# Patient Record
Sex: Female | Born: 1980
Health system: Southern US, Community
[De-identification: ages and names within clinical notes are randomized; demographics above are authoritative.]

## PROBLEM LIST (undated history)

## (undated) DIAGNOSIS — M1990 Unspecified osteoarthritis, unspecified site: Secondary | ICD-10-CM

## (undated) DIAGNOSIS — G43909 Migraine, unspecified, not intractable, without status migrainosus: Secondary | ICD-10-CM

## (undated) DIAGNOSIS — R51 Headache: Secondary | ICD-10-CM

## (undated) DIAGNOSIS — D219 Benign neoplasm of connective and other soft tissue, unspecified: Secondary | ICD-10-CM

## (undated) DIAGNOSIS — J45909 Unspecified asthma, uncomplicated: Secondary | ICD-10-CM

## (undated) DIAGNOSIS — R519 Headache, unspecified: Secondary | ICD-10-CM

## (undated) DIAGNOSIS — I10 Essential (primary) hypertension: Secondary | ICD-10-CM

## (undated) HISTORY — DX: Unspecified asthma, uncomplicated: J45.909

## (undated) HISTORY — DX: Benign neoplasm of connective and other soft tissue, unspecified: D21.9

## (undated) HISTORY — DX: Migraine, unspecified, not intractable, without status migrainosus: G43.909

## (undated) HISTORY — DX: Unspecified osteoarthritis, unspecified site: M19.90

## (undated) HISTORY — DX: Headache: R51

## (undated) HISTORY — DX: Headache, unspecified: R51.9

## (undated) HISTORY — DX: Essential (primary) hypertension: I10

---

## 2005-10-29 ENCOUNTER — Other Ambulatory Visit: Admission: RE | Admit: 2005-10-29 | Discharge: 2005-10-29 | Payer: Self-pay | Admitting: Obstetrics and Gynecology

## 2005-10-31 ENCOUNTER — Emergency Department (HOSPITAL_COMMUNITY): Admission: EM | Admit: 2005-10-31 | Discharge: 2005-10-31 | Payer: Self-pay | Admitting: Family Medicine

## 2006-09-22 HISTORY — PX: WISDOM TOOTH EXTRACTION: SHX21

## 2011-10-09 DIAGNOSIS — Z131 Encounter for screening for diabetes mellitus: Secondary | ICD-10-CM | POA: Insufficient documentation

## 2011-10-09 DIAGNOSIS — G43909 Migraine, unspecified, not intractable, without status migrainosus: Secondary | ICD-10-CM | POA: Insufficient documentation

## 2011-10-09 DIAGNOSIS — G44009 Cluster headache syndrome, unspecified, not intractable: Secondary | ICD-10-CM | POA: Insufficient documentation

## 2011-10-09 DIAGNOSIS — M25569 Pain in unspecified knee: Secondary | ICD-10-CM | POA: Insufficient documentation

## 2011-10-09 DIAGNOSIS — I10 Essential (primary) hypertension: Secondary | ICD-10-CM | POA: Insufficient documentation

## 2011-10-09 LAB — BASIC METABOLIC PANEL
BUN: 15 mg/dL (ref 4–21)
Creatinine: 0.9 mg/dL (ref 0.5–1.1)
Glucose: 92 mg/dL
Potassium: 4.2 mmol/L (ref 3.4–5.3)
Sodium: 135 mmol/L — AB (ref 137–147)

## 2011-10-09 LAB — LIPID PANEL
Cholesterol: 127 mg/dL (ref 0–200)
HDL: 48 mg/dL (ref 35–70)
LDL CALC: 70 mg/dL
Triglycerides: 45 mg/dL (ref 40–160)

## 2011-10-09 LAB — CBC AND DIFFERENTIAL
HEMATOCRIT: 39 % (ref 36–46)
HEMOGLOBIN: 13.2 g/dL (ref 12.0–16.0)
Platelets: 253 10*3/uL (ref 150–399)
WBC: 5.1 10^3/mL

## 2011-10-09 LAB — HEPATIC FUNCTION PANEL
ALK PHOS: 60 U/L (ref 25–125)
ALT: 12 U/L (ref 7–35)
AST: 18 U/L (ref 13–35)
Bilirubin, Total: 0.5 mg/dL

## 2011-10-09 LAB — TSH: TSH: 0.59 u[IU]/mL (ref 0.41–5.90)

## 2011-10-09 LAB — HEMOGLOBIN A1C: HEMOGLOBIN A1C: 5.3 % (ref 4.0–6.0)

## 2011-11-19 DIAGNOSIS — M2550 Pain in unspecified joint: Secondary | ICD-10-CM | POA: Insufficient documentation

## 2012-01-15 LAB — HM PAP SMEAR

## 2012-01-20 DIAGNOSIS — R8789 Other abnormal findings in specimens from female genital organs: Secondary | ICD-10-CM | POA: Insufficient documentation

## 2012-01-20 DIAGNOSIS — R87618 Other abnormal cytological findings on specimens from cervix uteri: Secondary | ICD-10-CM | POA: Insufficient documentation

## 2013-02-20 DIAGNOSIS — M659 Synovitis and tenosynovitis, unspecified: Secondary | ICD-10-CM | POA: Insufficient documentation

## 2014-07-26 ENCOUNTER — Telehealth: Payer: Self-pay | Admitting: Neurology

## 2014-07-26 NOTE — Telephone Encounter (Signed)
Pt was moved to 08-08-14 due to Dr Tomi Likens appt

## 2014-08-01 ENCOUNTER — Ambulatory Visit: Payer: Self-pay | Admitting: Neurology

## 2014-08-08 ENCOUNTER — Encounter: Payer: Self-pay | Admitting: Neurology

## 2014-08-08 ENCOUNTER — Ambulatory Visit (INDEPENDENT_AMBULATORY_CARE_PROVIDER_SITE_OTHER): Payer: 59 | Admitting: Neurology

## 2014-08-08 VITALS — BP 130/78 | HR 80 | Temp 98.4°F | Resp 20 | Ht 66.0 in | Wt 202.5 lb

## 2014-08-08 DIAGNOSIS — G43011 Migraine without aura, intractable, with status migrainosus: Secondary | ICD-10-CM

## 2014-08-08 MED ORDER — NORTRIPTYLINE HCL 10 MG PO CAPS: 10.0000 mg | ORAL_CAPSULE | Freq: Every day | ORAL | Status: DC

## 2014-08-08 MED ORDER — ELETRIPTAN HYDROBROMIDE 40 MG PO TABS: 40.0000 mg | ORAL_TABLET | ORAL | Status: DC | PRN

## 2014-08-08 NOTE — Patient Instructions (Signed)
1.  Stop lamictal.  Instead, start nortriptyline 10mg  at bedtime.  Call in 4 weeks with update and we can adjust dose if needed. 2.  Stop Treximet.  At earliest onset of headache, take 1 tablet of Relpax.  May repeat once in 2 hours if headache recurs or persists.  If ineffective, then next time you get a headache, you can try taking it with naproxen 500mg .  3.  Keep headache diary 4.  Follow up in 3 months but call in 4 weeks with update.

## 2014-08-08 NOTE — Progress Notes (Signed)
NEUROLOGY CONSULTATION NOTE  Ariel Perez MRN: [id number] DOB: 07/13/1981  Referring provider: Margurite Auerbach, MD Primary care provider: Margurite Auerbach, MD  Reason for consult:  headache  HISTORY OF PRESENT ILLNESS: Ariel Perez is a 33 year old right-handed woman who presents for headache.  Onset:  Since adolescence Location:  Bi-frontal Quality:  throbbing Intensity:  8/10 Aura:  no Prodrome:  no Associated symptoms:  Photophobia, phonophobia, sees halos around lights, osmophobia. Duration:  7-10 days Frequency:  Once a month.  Random.  Not associated with her period. Triggers/exacerbating factors:  Bananas, stress, perfumes Relieving factors:  Quiet, rest Activity:  Able to be active  Past abortive therapy:  Excedrin Migraine, ibuprofen, Tylenol, naproxen Past preventative therapy:  Topamax (side effects), gabapentin (side effects), atenolol (ineffective  Current abortive therapy:  Treximet (takes edge off for short while and headache recurs), indomethacin 25mg  Current preventative therapy:  Lamictal 25mg  Other medications:  Lamictal 25mg , Ortho Evra  Caffeine:  Dr. 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 Family history of headache:  no  PAST MEDICAL HISTORY: Past Medical History  Diagnosis Date  . Headache   . Hypertension   . Osteoarthritis     PAST SURGICAL HISTORY: No past surgical history on file.  MEDICATIONS: No current outpatient prescriptions on file prior to visit.   No current facility-administered medications on file prior to visit.    ALLERGIES: Not on File  FAMILY HISTORY: Family History  Problem Relation Age of Onset  . Cancer Father     unknown  . Diabetes Paternal Grandmother   . Hypertension Maternal Grandmother   . Rheum arthritis  Maternal Grandmother   . Cancer Maternal Grandfather     SOCIAL HISTORY: History   Social History  . Marital Status: Single    Spouse Name: N/A    Number of Children: N/A  . Years of Education: N/A   Occupational History  . Not on file.   Social History Main Topics  . Smoking status: Never Smoker   . Smokeless tobacco: Never Used  . Alcohol Use: 0.0 oz/week    0 Not specified per week     Comment: occ  . Drug Use: No  . Sexual Activity:    Partners: Male   Other Topics Concern  . Not on file   Social History Narrative  . No narrative on file    REVIEW OF SYSTEMS: Constitutional: No fevers, chills, or sweats, no generalized fatigue, change in appetite Eyes: No visual changes, double vision, eye pain Ear, nose and throat: No hearing loss, ear pain, nasal congestion, sore throat Cardiovascular: No chest pain, palpitations Respiratory:  No shortness of breath at rest or with exertion, wheezes GastrointestinaI: No nausea, vomiting, diarrhea, abdominal pain, fecal incontinence Genitourinary:  No dysuria, urinary retention or frequency Musculoskeletal:  No neck pain, back pain Integumentary: No rash, pruritus, skin lesions Neurological: as above Psychiatric: No depression, insomnia, anxiety Endocrine: No palpitations, fatigue, diaphoresis, mood swings, change in appetite, change in weight, increased thirst Hematologic/Lymphatic:  No anemia, purpura, petechiae. Allergic/Immunologic: no itchy/runny eyes, nasal congestion, recent allergic reactions, rashes  PHYSICAL EXAM: Filed Vitals:   08/08/14 0844  BP: 130/78  Pulse: 80  Temp: 98.4 F (36.9 C)  Resp: 20   General: No acute distress Head:  Normocephalic/atraumatic Eyes:  fundi unremarkable, without vessel  changes, exudates, hemorrhages or papilledema. CN III, IV, VI:  full range of motion, no nystagmus, no ptosis Neck: supple, no paraspinal tenderness, full range of motion Back: No paraspinal tenderness Heart:  regular rate and rhythm Lungs: Clear to auscultation bilaterally. Vascular: No carotid bruits. Neurological Exam: Mental status: alert and oriented to person, place, and time, recent and remote memory intact, fund of knowledge intact, attention and concentration intact, speech fluent and not dysarthric, language intact. Cranial nerves: CN I: not tested CN II: pupils equal, round and reactive to light, visual fields intact, fundi unremarkable, without vessel changes, exudates, hemorrhages or papilledema. CN III, IV, VI:  full range of motion, no nystagmus, no ptosis CN V: facial sensation intact CN VII: upper and lower face symmetric CN VIII: hearing intact CN IX, X: gag intact, uvula midline CN XI: sternocleidomastoid and trapezius muscles intact CN XII: tongue midline Bulk & Tone: normal, no fasciculations. Motor:  5/5 throughout Sensation:  Temperature and  Deep Tendon Reflexes:  2+ throughout, toes downgoing Finger to nose testing:  No dysmetria Gait:  Normal station and stride.  Able to turn and walk in tandem. Romberg negative.  IMPRESSION: Migraine without aura, intractable  PLAN: 1.  Stop lamictal.  Instead, start nortriptyline 10mg  at bedtime.  Call in 4 weeks with update and we can adjust dose if needed. 2.  Stop Treximet.  Instead take Relpax 40mg  as it is long-acting. 3.  Keep headache diary 4.  Will get notes from prior neurologists. 5.  Follow up in 3 months but call in 4 weeks with update.  Thank you for allowing me to take part in the care of this patient.  Metta Clines, DO  CC: Margurite Auerbach, MD

## 2014-08-15 ENCOUNTER — Telehealth: Payer: Self-pay | Admitting: Neurology

## 2014-08-15 NOTE — Telephone Encounter (Signed)
Pt reports Relpax has not broken her migraine pain. Please CB @ [phone]

## 2014-08-15 NOTE — Telephone Encounter (Signed)
Please advise to below note

## 2014-08-16 MED ORDER — SUMATRIPTAN SUCCINATE 6 MG/0.5ML ~~LOC~~ SOLN: 6.0000 mg | SUBCUTANEOUS | Status: DC | PRN

## 2014-08-16 NOTE — Telephone Encounter (Signed)
I would prescribe her Imitrex 6mg  [place] injections.  In the meantime, if the migraine is persisting, she can come to the office this morning for headache cocktail (Toradol 60mg Lenard Galloway 25mg /Reglan 10mg ).

## 2014-08-16 NOTE — Telephone Encounter (Signed)
Tried to call patient at work - but was on hold for 12+ minutes. LM on patient's voicemail letting her know that pharmacist could instruct her on how to give injections. She is to call back if she is not comfortable with this and we will try nasal spray. RX sent to pharmacy.

## 2014-08-16 NOTE — Telephone Encounter (Signed)
The pharmacist should be able to tell her how to use the injections.  It is not a medication that typically requires an RN to demonstrate.  Otherwise, she can try the Imitrex 5mg  nasal spray, 1 spray in each nostril x1.  May repeat once in 2 hours if needed.

## 2014-08-16 NOTE — Telephone Encounter (Signed)
Left message on machine for patient to call back.

## 2014-08-16 NOTE — Telephone Encounter (Signed)
Patient called back - states she is at work today and can not come to the office. She has never given herself injections before and would need to be trained. Home health will only train patient if she is homebound and she can not make it to our office. Please advise.

## 2014-08-21 ENCOUNTER — Telehealth: Payer: Self-pay | Admitting: *Deleted

## 2014-08-21 NOTE — Telephone Encounter (Signed)
Called to pharmacy had not been called in  Or faxed on 08/16/14 per Pam Sheets

## 2014-08-21 NOTE — Telephone Encounter (Signed)
Patient was calling to check the status of a Rx that was requested on Wednesday for Imitrex injections. Call back number [phone]

## 2014-09-12 ENCOUNTER — Other Ambulatory Visit: Payer: Self-pay | Admitting: Neurology

## 2014-10-12 ENCOUNTER — Other Ambulatory Visit: Payer: Self-pay | Admitting: Nurse Practitioner

## 2014-10-12 ENCOUNTER — Other Ambulatory Visit: Payer: 59

## 2014-10-12 DIAGNOSIS — J020 Streptococcal pharyngitis: Secondary | ICD-10-CM

## 2014-10-12 MED ORDER — AMOXICILLIN-POT CLAVULANATE 875-125 MG PO TABS: 1.0000 | ORAL_TABLET | Freq: Two times a day (BID) | ORAL | Status: DC

## 2014-10-12 NOTE — Addendum Note (Signed)
Addended by: Johnsie Cancel on: 10/12/2014 10:12 AM   Modules accepted: Orders

## 2014-10-17 ENCOUNTER — Other Ambulatory Visit: Payer: Self-pay | Admitting: Nurse Practitioner

## 2014-10-17 MED ORDER — FLUCONAZOLE 150 MG PO TABS: 150.0000 mg | ORAL_TABLET | Freq: Once | ORAL | Status: DC

## 2014-10-23 LAB — CULTURE, GROUP A STREP: STREP A CULTURE: NEGATIVE

## 2014-11-08 ENCOUNTER — Ambulatory Visit: Payer: 59 | Admitting: Neurology

## 2014-11-29 ENCOUNTER — Telehealth: Payer: Self-pay | Admitting: Neurology

## 2014-11-29 NOTE — Telephone Encounter (Signed)
Per Ariel Perez-Pt's 12/08/14 appt needed to be r/s to close dr. Georgie Chard schedule. I called pt to r/s her appt and she was at a store and advised me that she would call me later to r/s.

## 2014-11-30 ENCOUNTER — Ambulatory Visit: Payer: 59 | Admitting: Neurology

## 2014-12-08 ENCOUNTER — Ambulatory Visit: Payer: 59 | Admitting: Neurology

## 2015-01-08 ENCOUNTER — Telehealth: Payer: Self-pay | Admitting: Neurology

## 2015-01-08 NOTE — Telephone Encounter (Signed)
Pt with migraine over the w/e. Imitrex hasn't helped and her daily meds were not refilled. Pupils uneven, right is bigger than the left. CB# [phone] / Sherri S.

## 2015-01-08 NOTE — Telephone Encounter (Signed)
I spoke with patient  Ariel Perez 10 mg was called into [facility] pharmacy

## 2015-01-15 ENCOUNTER — Ambulatory Visit (INDEPENDENT_AMBULATORY_CARE_PROVIDER_SITE_OTHER): Payer: 59 | Admitting: Neurology

## 2015-01-15 ENCOUNTER — Encounter: Payer: Self-pay | Admitting: Neurology

## 2015-01-15 VITALS — BP 128/62 | HR 70 | Resp 16 | Ht 66.0 in | Wt 198.1 lb

## 2015-01-15 DIAGNOSIS — G43019 Migraine without aura, intractable, without status migrainosus: Secondary | ICD-10-CM | POA: Insufficient documentation

## 2015-01-15 MED ORDER — NORTRIPTYLINE HCL 25 MG PO CAPS: 25.0000 mg | ORAL_CAPSULE | Freq: Every day | ORAL | Status: DC

## 2015-01-15 MED ORDER — PREDNISONE 10 MG PO TABS: ORAL_TABLET | ORAL | Status: DC

## 2015-01-15 NOTE — Patient Instructions (Signed)
1.  Increase nortriptyline to 2tablets at bedtime.  Pick up a two week prescription of nortriptyline 25mg  tablets (1 tablet at bedtime).  Start this once you finish the 10mg  tablets.  When you finish the 25mg  tablets, call with update and we can increase dose further if needed. 2.  In order to try and break current headache, will prescribe you a prednisone taper.  Take 6tabs at once x1day, then 5tabs x1day, then 4tabs x1day, then 3tabs x1day, then 2tabs x1day, then 1tab x1day, then STOP. 3.  Continue Imitrex injection as needed. 4.  Follow up in 3 months but call in 4 weeks with update.

## 2015-01-15 NOTE — Progress Notes (Signed)
NEUROLOGY FOLLOW UP OFFICE NOTE  [name] [id number]  HISTORY OF PRESENT ILLNESS: Ariel Perez is a 34 year old right-handed woman who follows up for migraine without aura.   UPDATE: She reports increased stress at work, which worsened her migraines.  She has had constant headache for 2 weeks with fluctuations in intensity. Intensity:  4/10 unless intense, in which it is 8-9/10 Duration:  Constant with fluctuations lasting a day Frequency:  daily  Current abortive therapy:  Imitrex 6mg  [place] (usually works until two weeks ago Current preventative therapy:  Nortriptyline 10mg .  She took this until February and then ran out.  Did not get refilled until about a week ago. Other medications:  Ortho Evra   HISTORY: Onset:  Since adolescence Location:  Bi-frontal Quality:  throbbing Initial Intensity:  8/10 Aura:  no Prodrome:  no Associated symptoms:  Photophobia, phonophobia, sees halos around lights, osmophobia. Initial Duration:  7-10 days Initial Frequency:  Once a month.  Random.  Not associated with her period. 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 Family history of headache:  no  PAST MEDICAL HISTORY: Past Medical History  Diagnosis Date  . Headache   . Hypertension   . Osteoarthritis     MEDICATIONS: Current Outpatient Prescriptions on File Prior to Visit  Medication Sig Dispense Refill  . SUMAtriptan (IMITREX) 6 MG/0.5ML SOLN injection Inject 0.5 mLs (6 mg total) into the skin every 2 (two) hours as needed for migraine or headache (not to exceed 2 doses in 24 hours). May repeat in  2 hours if headache persists or recurs. 9 vial 1  . amoxicillin-clavulanate (AUGMENTIN) 875-125 MG per tablet Take 1 tablet by mouth 2 (two) times daily. (Patient not taking: Reported on 01/15/2015) 20 tablet 0  . diclofenac sodium (VOLTAREN) 1 % GEL daily as needed. Frequency:BID   Dosage:0.0     Instructions:  Note:Dose: 1 %    . eletriptan (RELPAX) 40 MG tablet Take 1 tablet (40 mg total) by mouth as needed for migraine or headache. Take 1tab at earliest onset of HA. May repeat x1 in 2 hours if headache persists or recurs. (Patient not taking: Reported on 01/15/2015) 9 tablet 0  . fluconazole (DIFLUCAN) 150 MG tablet Take 1 tablet (150 mg total) by mouth once. (Patient not taking: Reported on 01/15/2015) 2 tablet 0  . indomethacin (INDOCIN) 25 MG capsule Take 25 mg by mouth.    . lamoTRIgine (LAMICTAL) 25 MG tablet Take by mouth.    . meloxicam (MOBIC) 7.5 MG tablet Take 7.5 mg by mouth.    . norelgestromin-ethinyl estradiol (ORTHO EVRA) 150-35 MCG/24HR transdermal patch Place onto the skin.    . SUMAtriptan-naproxen (TREXIMET) 85-500 MG per tablet Take by mouth.     No current facility-administered medications on file prior to visit.    ALLERGIES: Not on File  FAMILY HISTORY: Family History  Problem Relation Age of Onset  . Cancer Father     unknown  . Diabetes Paternal Grandmother   . Hypertension Maternal Grandmother   . Rheum arthritis Maternal Grandmother   . Cancer Maternal Grandfather     SOCIAL HISTORY: History   Social History  . Marital Status: Single  Spouse Name: N/A  . Number of Children: N/A  . Years of Education: N/A   Occupational History  . Not on file.   Social History Main Topics  . Smoking status: Never Smoker   . Smokeless tobacco: Never Used  . Alcohol Use: 0.0 oz/week    0 Standard drinks or equivalent per week     Comment: occ  . Drug Use: No  . Sexual Activity:    Partners: Male   Other Topics Concern  . Not on file   Social History  Narrative  . No narrative on file    REVIEW OF SYSTEMS: Constitutional: No fevers, chills, or sweats, no generalized fatigue, change in appetite Eyes: No visual changes, double vision, eye pain Ear, nose and throat: No hearing loss, ear pain, nasal congestion, sore throat Cardiovascular: No chest pain, palpitations Respiratory:  No shortness of breath at rest or with exertion, wheezes GastrointestinaI: No nausea, vomiting, diarrhea, abdominal pain, fecal incontinence Genitourinary:  No dysuria, urinary retention or frequency Musculoskeletal:  No neck pain, back pain Integumentary: No rash, pruritus, skin lesions Neurological: as above Psychiatric: No depression, insomnia, anxiety Endocrine: No palpitations, fatigue, diaphoresis, mood swings, change in appetite, change in weight, increased thirst Hematologic/Lymphatic:  No anemia, purpura, petechiae. Allergic/Immunologic: no itchy/runny eyes, nasal congestion, recent allergic reactions, rashes  PHYSICAL EXAM: Filed Vitals:   01/15/15 0747  BP: 128/62  Pulse: 70  Resp: 16   General: No acute distress Head:  Normocephalic/atraumatic Eyes:  Fundoscopic exam unremarkable without vessel changes, exudates, hemorrhages or papilledema. Neck: supple, no paraspinal tenderness, full range of motion Heart:  Regular rate and rhythm Lungs:  Clear to auscultation bilaterally Back: No paraspinal tenderness Neurological Exam: alert and oriented to person, place, and time. Attention span and concentration intact, recent and remote memory intact, fund of knowledge intact.  Speech fluent and not dysarthric, language intact.  CN II-XII intact. Fundoscopic exam unremarkable without vessel changes, exudates, hemorrhages or papilledema.  Bulk and tone normal, muscle strength 5/5 throughout.  Sensation to light touch, temperature and vibration intact.  Deep tendon reflexes 2+ throughout, toes downgoing.  Finger to nose and heel to shin testing intact.  Gait  normal, Romberg negative.  IMPRESSION: Migraine without aura, now intractable.  PLAN: 1.  Will prescribe prednisone taper to try and break the headache. 2.  Increase nortriptyline to 20mg  at bedtime, then will start 25mg  at bedtime when she runs out of the 10mg  tablets.  She will call in 4 weeks with update and we can increase dose further if needed. 3.  Imitrex injection as needed and directed 4.  Follow up in 3 months.  15 minutes spent with patient, over 50% spent discussing plan.  Metta Clines, DO  CC: Margurite Auerbach, MD

## 2015-01-29 ENCOUNTER — Telehealth: Payer: Self-pay | Admitting: *Deleted

## 2015-01-29 NOTE — Telephone Encounter (Signed)
She can start venlafaxine ER.  She should take 37.5mg  daily for 7 days, then 75mg  daily.  Should call in 4 weeks with update.

## 2015-01-29 NOTE — Telephone Encounter (Signed)
Patient calling to report that her body does not agree with the medication (Nortriptyline)she has light headedness, depression, she would like to try something else. Please advise

## 2015-01-29 NOTE — Telephone Encounter (Signed)
Patient calling to report that her body does not agree with the medication (Nortriptyline)she has light headedness, depression, she would like to try something else. Please advise Call back number [phone]

## 2015-01-30 ENCOUNTER — Other Ambulatory Visit: Payer: Self-pay | Admitting: *Deleted

## 2015-01-30 MED ORDER — VENLAFAXINE HCL ER 37.5 MG PO CP24: ORAL_CAPSULE | ORAL | Status: DC

## 2015-01-30 NOTE — Telephone Encounter (Signed)
Patient is aware of medication sent to pharmacy with instruction . I left a voicemail  For patient

## 2015-02-05 ENCOUNTER — Telehealth: Payer: Self-pay | Admitting: *Deleted

## 2015-02-05 NOTE — Telephone Encounter (Signed)
Patient returned your call C/b# [phone]

## 2015-02-05 NOTE — Telephone Encounter (Signed)
FMLA will not be filled out at this time per Dr Tomi Likens patient is  Aware

## 2015-02-07 ENCOUNTER — Ambulatory Visit (INDEPENDENT_AMBULATORY_CARE_PROVIDER_SITE_OTHER): Payer: 59 | Admitting: Nurse Practitioner

## 2015-02-07 ENCOUNTER — Encounter: Payer: Self-pay | Admitting: Nurse Practitioner

## 2015-02-07 VITALS — BP 142/84 | HR 75 | Temp 98.1°F | Resp 16 | Ht 65.5 in | Wt 193.1 lb

## 2015-02-07 DIAGNOSIS — G43019 Migraine without aura, intractable, without status migrainosus: Secondary | ICD-10-CM | POA: Diagnosis not present

## 2015-02-07 DIAGNOSIS — Z7689 Persons encountering health services in other specified circumstances: Secondary | ICD-10-CM

## 2015-02-07 DIAGNOSIS — Z7189 Other specified counseling: Secondary | ICD-10-CM | POA: Diagnosis not present

## 2015-02-07 NOTE — Progress Notes (Signed)
Pre visit review using our clinic review tool, if applicable. No additional management support is needed unless otherwise documented below in the visit note. 

## 2015-02-07 NOTE — Patient Instructions (Signed)
Nice to meet you! Welcome to [facility]!

## 2015-02-07 NOTE — Progress Notes (Signed)
Subjective:    Patient ID: Ariel Perez, female    DOB: [birth year], 34 y.o.   MRN: [id number]  HPI   Ms. Scardina is a 34 yo female establishing care and CC of migraines.   1) New pt info:  Diet- No formal   Exercise- twice a week for 30 min   Immunizations- UTD  Pap- Dec. 2015   Eye Exam- Jan. 2016   Dental Exam- Not UTD  LMP- Mirena   2) Chronic Problems-  HTN- Labile  3) Acute Problems-  Relpax- not helpful  Dr. Tomi Likens stopped Lamictal, Indocin and Treximet  Nortriptyline- off balance, depression, urinary retention  Headaches 2-3 x a week, last for 1 day-3 months. Whole head, photophobia, movement- disturbance, and smells trigger.   Bananas, weather, stress triggers, unsure about menstruation as a trigger Headache clinic- not helpful  Started 11 years ago. Cannot work with them due to the symptoms.   Review of Systems  Constitutional: Negative for fever, chills, diaphoresis and fatigue.  HENT: Negative for tinnitus and trouble swallowing.   Eyes: Negative for visual disturbance.       Has glasses and contacts  Respiratory: Negative for chest tightness, shortness of breath and wheezing.   Cardiovascular: Negative for chest pain, palpitations and leg swelling.  Gastrointestinal: Negative for nausea, vomiting and diarrhea.  Genitourinary: Negative for difficulty urinating.  Musculoskeletal: Negative for back pain and neck pain.  Skin: Negative for rash.  Neurological: Positive for headaches. Negative for dizziness, weakness and numbness.  Hematological: Does not bruise/bleed easily.  Psychiatric/Behavioral: Negative for suicidal ideas and sleep disturbance. The patient is not nervous/anxious.    Past Medical History  Diagnosis Date  . Headache   . Hypertension   . Osteoarthritis   . Migraines     History   Social History  . Marital Status: Single    Spouse Name: N/A  . Number of Children: N/A  . Years of Education: N/A   Occupational History  . Not on  file.   Social History Main Topics  . Smoking status: Never Smoker   . Smokeless tobacco: Never Used  . Alcohol Use: 0.0 oz/week    0 Standard drinks or equivalent per week     Comment: occ  . Drug Use: No  . Sexual Activity:    Partners: Male     Comment: 1 partner   Other Topics Concern  . Not on file   Social History Narrative   Works here at [employer] by herself    2 dogs live inside    Caffeine- Occasional    Right handed    Enjoys sleeping     Past Surgical History  Procedure Laterality Date  . Wisdom tooth extraction N/A 2008    Approx    Family History  Problem Relation Age of Onset  . Cancer Father     unknown  . Diabetes Paternal Grandmother   . Hypertension Maternal Grandmother   . Rheum arthritis Maternal Grandmother   . Cancer Maternal Grandfather     No Known Allergies  Current Outpatient Prescriptions on File Prior to Visit  Medication Sig Dispense Refill  . meloxicam (MOBIC) 7.5 MG tablet Take 7.5 mg by mouth.    . predniSONE (DELTASONE) 10 MG tablet Take 6tabs x1day, then 5tabs x1day, then 4tabs x1day, then 3tabs x1day, then 2tabs x1day, then 1tab x1day, then STOP 21 tablet 0  . SUMAtriptan (IMITREX) 6 MG/0.5ML SOLN injection Inject 0.5 mLs (6 mg  total) into the skin every 2 (two) hours as needed for migraine or headache (not to exceed 2 doses in 24 hours). May repeat in 2 hours if headache persists or recurs. 9 vial 1  . venlafaxine XR (EFFEXOR XR) 37.5 MG 24 hr capsule Take 1 tablet PO QD for( 7 days)  then 2 tablets PO  QD  #60 60 capsule 2   No current facility-administered medications on file prior to visit.       Objective:   Physical Exam  Constitutional: She is oriented to person, place, and time. She appears well-developed and well-nourished. No distress.  BP 142/84 mmHg  Pulse 75  Temp(Src) 98.1 F (36.7 C)  Resp 16  Ht 5' 5.5" (1.664 m)  Wt 193 lb 1.9 oz (87.599 kg)  BMI 31.64 kg/m2  SpO2 99%  LMP 01/12/2015   HENT:   Head: Normocephalic and atraumatic.  Right Ear: External ear normal.  Left Ear: External ear normal.  Cardiovascular: Normal rate, regular rhythm and normal heart sounds.  Exam reveals no gallop and no friction rub.   No murmur heard. Pulmonary/Chest: Effort normal and breath sounds normal. No respiratory distress. She has no wheezes. She has no rales. She exhibits no tenderness.  Neurological: She is alert and oriented to person, place, and time. No cranial nerve deficit. She exhibits normal muscle tone. Coordination normal.  Skin: Skin is warm and dry. No rash noted. She is not diaphoretic.  Psychiatric: She has a normal mood and affect. Her behavior is normal. Judgment and thought content normal.       Assessment & Plan:

## 2015-02-08 ENCOUNTER — Encounter: Payer: Self-pay | Admitting: *Deleted

## 2015-02-14 NOTE — Assessment & Plan Note (Signed)
Discussed acute and chronic issues. Reviewed health maintenance measures, PFSHx, and immunizations. Obtain records from previous facility.   

## 2015-02-14 NOTE — Assessment & Plan Note (Signed)
Prednisone tapers help with intractable migraines. Pt has frequent headaches and currently the only helpful medication is imitrex [place]. She will follow up with Dr. Tomi Likens in 3 months. FU in 3 months with me.

## 2015-03-02 ENCOUNTER — Encounter: Payer: Self-pay | Admitting: Neurology

## 2015-03-02 ENCOUNTER — Other Ambulatory Visit: Payer: Self-pay | Admitting: Neurology

## 2015-03-02 MED ORDER — SUMATRIPTAN SUCCINATE 6 MG/0.5ML ~~LOC~~ SOLN: 6.0000 mg | SUBCUTANEOUS | Status: DC | PRN

## 2015-03-02 MED ORDER — VENLAFAXINE HCL ER 37.5 MG PO CP24: ORAL_CAPSULE | ORAL | Status: DC

## 2015-04-09 ENCOUNTER — Other Ambulatory Visit: Payer: Self-pay | Admitting: Nurse Practitioner

## 2015-04-09 ENCOUNTER — Other Ambulatory Visit: Payer: Self-pay | Admitting: *Deleted

## 2015-04-09 DIAGNOSIS — R300 Dysuria: Secondary | ICD-10-CM

## 2015-04-09 DIAGNOSIS — Z139 Encounter for screening, unspecified: Secondary | ICD-10-CM

## 2015-04-09 LAB — POCT URINALYSIS DIPSTICK
Bilirubin, UA: NEGATIVE
Glucose, UA: NEGATIVE
Ketones, UA: NEGATIVE
Leukocytes, UA: NEGATIVE
Nitrite, UA: NEGATIVE
Spec Grav, UA: >=1.03
Urobilinogen, UA: 2
pH, UA: 5.5

## 2015-04-10 ENCOUNTER — Other Ambulatory Visit: Payer: Self-pay | Admitting: Nurse Practitioner

## 2015-04-10 ENCOUNTER — Encounter: Payer: Self-pay | Admitting: Nurse Practitioner

## 2015-04-10 ENCOUNTER — Ambulatory Visit (INDEPENDENT_AMBULATORY_CARE_PROVIDER_SITE_OTHER): Payer: 59 | Admitting: Nurse Practitioner

## 2015-04-10 VITALS — BP 144/90 | HR 81 | Temp 98.4°F | Resp 16 | Ht 65.5 in | Wt 201.0 lb

## 2015-04-10 DIAGNOSIS — M546 Pain in thoracic spine: Secondary | ICD-10-CM | POA: Diagnosis not present

## 2015-04-10 DIAGNOSIS — E871 Hypo-osmolality and hyponatremia: Secondary | ICD-10-CM | POA: Diagnosis not present

## 2015-04-10 LAB — BASIC METABOLIC PANEL
BUN: 10 mg/dL (ref 6–23)
CO2: 27 mEq/L (ref 19–32)
Calcium: 9.4 mg/dL (ref 8.4–10.5)
Chloride: 103 mEq/L (ref 96–112)
Creatinine, Ser: 0.79 mg/dL (ref 0.40–1.20)
GFR: 105.96 mL/min (ref >60.00)
GLUCOSE: 76 mg/dL (ref 70–99)
POTASSIUM: 3.6 meq/L (ref 3.5–5.1)
Sodium: 137 mEq/L (ref 135–145)

## 2015-04-10 MED ORDER — CYCLOBENZAPRINE HCL 10 MG PO TABS: 10.0000 mg | ORAL_TABLET | Freq: Every day | ORAL | Status: DC

## 2015-04-10 NOTE — Progress Notes (Signed)
   Subjective:    Patient ID: Ariel Perez, female    DOB: 1981-04-03, 34 y.o.   MRN: [id number]  HPI  Ariel Perez is a 34 yo female with a CC of back pain x 5 days.   1) Spasms last night, wearing support, denies trauma, and cleaning, moving furniture.   Aleve-Not helpful  Salon Pas patch- Not helpful   Heat pack on Saturday- not helpful   Review of Systems  Constitutional: Negative for fever, chills, diaphoresis and fatigue.  Respiratory: Negative for chest tightness, shortness of breath and wheezing.   Cardiovascular: Negative for chest pain, palpitations and leg swelling.  Gastrointestinal: Negative for nausea, vomiting, diarrhea and constipation.  Musculoskeletal: Positive for myalgias and back pain. Negative for joint swelling, arthralgias, gait problem, neck pain and neck stiffness.  Skin: Negative for rash.  Neurological: Positive for headaches. Negative for dizziness and numbness.       History of migraines      Objective:   Physical Exam  Constitutional: She is oriented to person, place, and time. She appears well-developed and well-nourished. No distress.  BP 144/90 mmHg  Pulse 81  Temp(Src) 98.4 F (36.9 C)  Resp 16  Ht 5' 5.5" (1.664 m)  Wt 201 lb (91.173 kg)  BMI 32.93 kg/m2  SpO2 97%   HENT:  Head: Normocephalic and atraumatic.  Right Ear: External ear normal.  Left Ear: External ear normal.  Musculoskeletal: She exhibits no edema or tenderness.  Neurological: She is alert and oriented to person, place, and time. No cranial nerve deficit. She exhibits normal muscle tone. Coordination normal.  Iliopsoas 5/5 Bilateral, Tib anterior 5/5 bilateral, EHL 5/5 bilateral, no ankle clonus, intact heel/toe/sequential walking, sensation intact upper and lower extremities. Straight leg raise negative bilaterally.    Skin: Skin is warm and dry. No rash noted. She is not diaphoretic.  Psychiatric: She has a normal mood and affect. Her behavior is normal. Judgment  and thought content normal.      Assessment & Plan:

## 2015-04-10 NOTE — Progress Notes (Signed)
Pre visit review using our clinic review tool, if applicable. No additional management support is needed unless otherwise documented below in the visit note. 

## 2015-04-10 NOTE — Assessment & Plan Note (Signed)
Worsening with conservative therapy. Will try flexeril with aleve. Asked pt not to drive or operate heavy machinery while on this medication. Will follow.

## 2015-04-10 NOTE — Assessment & Plan Note (Signed)
No recent labs. Pt had hyponatremia in the past. Will obtain non-fasting BMET to check potassium and sodium levels.

## 2015-04-10 NOTE — Patient Instructions (Signed)

## 2015-04-11 LAB — URINE CULTURE

## 2015-05-09 ENCOUNTER — Ambulatory Visit (INDEPENDENT_AMBULATORY_CARE_PROVIDER_SITE_OTHER): Payer: 59 | Admitting: Neurology

## 2015-05-09 ENCOUNTER — Encounter: Payer: Self-pay | Admitting: Neurology

## 2015-05-09 VITALS — BP 128/74 | HR 76 | Resp 16 | Ht 65.5 in | Wt 195.4 lb

## 2015-05-09 DIAGNOSIS — G43009 Migraine without aura, not intractable, without status migrainosus: Secondary | ICD-10-CM

## 2015-05-09 DIAGNOSIS — G43709 Chronic migraine without aura, not intractable, without status migrainosus: Secondary | ICD-10-CM | POA: Insufficient documentation

## 2015-05-09 NOTE — Progress Notes (Signed)
NEUROLOGY FOLLOW UP OFFICE NOTE  Ariel Perez [id number]  HISTORY OF PRESENT ILLNESS: Ariel Perez is a 34 year old right-handed woman who follows up for migraine without aura.   UPDATE: Headaches are improved since April. Intensity:  8/10 Duration:  8 hours Frequency:  5-6 days per month  Current abortive therapy:  Imitrex 6mg  [place]  Current preventative therapy:  venlafaxine ER 75mg   HISTORY: Onset:  Since adolescence Location:  Bi-frontal Quality:  throbbing Initial Intensity:  8/10; April 4/10 (8-9/10 severe) Aura:  no Prodrome:  no Associated symptoms:  Photophobia, phonophobia, sees halos around lights, osmophobia. Initial Duration:  7-10 days; April constant with fluctuations lasting a day Initial Frequency:  Once a month.  Random.  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 Family history of headache:  no  PAST MEDICAL HISTORY: Past Medical History  Diagnosis Date  . Headache   . Hypertension   . Osteoarthritis   . Migraines     MEDICATIONS: Current Outpatient Prescriptions on File Prior to Visit  Medication Sig Dispense Refill  . cyclobenzaprine (FLEXERIL) 10 MG tablet Take 1 tablet (10 mg total) by mouth at bedtime. 30 tablet 0  . SUMAtriptan (IMITREX) 6 MG/0.5ML SOLN injection Inject 0.5 mLs (6 mg total) into the skin every 2 (two) hours as needed for migraine or headache (not to exceed 2 doses in 24 hours). May repeat in 2 hours if headache persists or recurs. 9 vial 1  . venlafaxine XR (EFFEXOR XR)  37.5 MG 24 hr capsule Take 1 tablet PO QD for( 7 days)  then 2 tablets PO  QD  #60 60 capsule 2  . meloxicam (MOBIC) 7.5 MG tablet Take 7.5 mg by mouth.     No current facility-administered medications on file prior to visit.    ALLERGIES: No Known Allergies  FAMILY HISTORY: Family History  Problem Relation Age of Onset  . Cancer Father     unknown  . Diabetes Paternal Grandmother   . Hypertension Maternal Grandmother   . Rheum arthritis Maternal Grandmother   . Cancer Maternal Grandfather     SOCIAL HISTORY: Social History   Social History  . Marital Status: Single    Spouse Name: N/A  . Number of Children: N/A  . Years of Education: N/A   Occupational History  . Not on file.   Social History Main Topics  . Smoking status: Never Smoker   . Smokeless tobacco: Never Used  . Alcohol Use: 0.0 oz/week    0 Standard drinks or equivalent per week     Comment: occ  . Drug Use: No  . Sexual Activity:    Partners: Male     Comment: 1 partner   Other Topics Concern  . Not on file   Social History Narrative   Works here at [employer] by herself    2 dogs live inside    Caffeine- Occasional    Right handed    Enjoys sleeping     REVIEW OF SYSTEMS: Constitutional: No fevers, chills, or sweats, no generalized fatigue, change in appetite Eyes: No visual changes, double vision, eye pain Ear, nose  and throat: No hearing loss, ear pain, nasal congestion, sore throat Cardiovascular: No chest pain, palpitations Respiratory:  No shortness of breath at rest or with exertion, wheezes GastrointestinaI: No nausea, vomiting, diarrhea, abdominal pain, fecal incontinence Genitourinary:  No dysuria, urinary retention or frequency Musculoskeletal:  No neck pain, back pain Integumentary: No rash, pruritus, skin lesions Neurological: as above Psychiatric: No depression, insomnia, anxiety Endocrine: No palpitations, fatigue, diaphoresis, mood swings, change in appetite, change  in weight, increased thirst Hematologic/Lymphatic:  No anemia, purpura, petechiae. Allergic/Immunologic: no itchy/runny eyes, nasal congestion, recent allergic reactions, rashes  PHYSICAL EXAM: Filed Vitals:   05/09/15 1425  BP: 128/74  Pulse: 76  Resp: 16   General: No acute distress.  Patient appears well-groomed.   Head:  Normocephalic/atraumatic Eyes:  Fundoscopic exam unremarkable without vessel changes, exudates, hemorrhages or papilledema. Neck: supple, no paraspinal tenderness, full range of motion Heart:  Regular rate and rhythm Lungs:  Clear to auscultation bilaterally Back: No paraspinal tenderness Neurological Exam: alert and oriented to person, place, and time. Attention span and concentration intact, recent and remote memory intact, fund of knowledge intact.  Speech fluent and not dysarthric, language intact.  CN II-XII intact. Fundoscopic exam unremarkable without vessel changes, exudates, hemorrhages or papilledema.  Bulk and tone normal, muscle strength 5/5 throughout.  Sensation to light touch, temperature and vibration intact.  Deep tendon reflexes 2+ throughout, toes downgoing.  Finger to nose and heel to shin testing intact.  Gait normal  IMPRESSION: Migraine without aura  PLAN: Continue venlafaxine ER 75mg  daily.  Sumatriptan 6mg  [place] Follow up in 3 months.  15 minutes spent face to face with patient, over 50% spent discussing management.  Metta Clines, DO  CC:  Lorane Gell, NP

## 2015-05-09 NOTE — Patient Instructions (Signed)
Migraine Recommendations: 1.  Continue venlafaxine ER 75mg  daily 2.  Sumatriptan injection as needed 3.  Limit use of pain relievers to no more than 2 days out of the week.  These medications include acetaminophen, ibuprofen, triptans and narcotics.  This will help reduce risk of rebound headaches. 4.  Be aware of common food triggers such as processed sweets, processed foods with nitrites (such as deli meat, hot dogs, sausages), foods with MSG, alcohol (such as wine), chocolate, certain cheeses, certain fruits (dried fruits, some citrus fruit), vinegar, diet soda. 4.  Avoid caffeine 5.  Routine exercise 6.  Proper sleep hygiene 7.  Stay adequately hydrated with water 8.  Keep a headache diary. 9.  Maintain proper stress management. 10.  Do not skip meals. 11.  Follow up in 44months

## 2015-06-19 ENCOUNTER — Encounter: Payer: Self-pay | Admitting: Nurse Practitioner

## 2015-07-10 ENCOUNTER — Ambulatory Visit (INDEPENDENT_AMBULATORY_CARE_PROVIDER_SITE_OTHER): Payer: 59 | Admitting: Nurse Practitioner

## 2015-07-10 VITALS — BP 138/98 | HR 84 | Temp 98.2°F | Resp 14 | Ht 65.5 in | Wt 203.4 lb

## 2015-07-10 DIAGNOSIS — I10 Essential (primary) hypertension: Secondary | ICD-10-CM | POA: Diagnosis not present

## 2015-07-10 MED ORDER — HYDROCHLOROTHIAZIDE 12.5 MG PO TABS: 12.5000 mg | ORAL_TABLET | Freq: Every day | ORAL | Status: DC

## 2015-07-10 NOTE — Patient Instructions (Signed)
Take 1 tablet in the morning. May make you pee more :)   Get your labs drawn in 2-3 weeks.

## 2015-07-10 NOTE — Progress Notes (Signed)
Patient ID: Ariel Perez, female    DOB: 06/24/1981  Age: 34 y.o. MRN: [id number]  CC: Follow-up   HPI Ariel Perez presents for follow-up of blood pressure concerns.  1) patient reports stress in the workplace. She has not been taking her blood pressure, but feels it has been high due to increased headaches. She did take it last night it was 138/88. She has taken medications in the past denies any allergies or concerns with past blood pressure medications.  History Ariel Perez has a past medical history of Headache; Hypertension; Osteoarthritis; and Migraines.   She has past surgical history that includes Wisdom tooth extraction (N/A, 2008).   Her family history includes Cancer in her father and maternal grandfather; Diabetes in her paternal grandmother; Hypertension in her maternal grandmother; Rheum arthritis in her maternal grandmother.She reports that she has never smoked. She has never used smokeless tobacco. She reports that she drinks alcohol. She reports that she does not use illicit drugs.  Outpatient Prescriptions Prior to Visit  Medication Sig Dispense Refill  . cyclobenzaprine (FLEXERIL) 10 MG tablet Take 1 tablet (10 mg total) by mouth at bedtime. 30 tablet 0  . meloxicam (MOBIC) 7.5 MG tablet Take 7.5 mg by mouth.    . SUMAtriptan (IMITREX) 6 MG/0.5ML SOLN injection Inject 0.5 mLs (6 mg total) into the skin every 2 (two) hours as needed for migraine or headache (not to exceed 2 doses in 24 hours). May repeat in 2 hours if headache persists or recurs. 9 vial 1  . venlafaxine XR (EFFEXOR XR) 37.5 MG 24 hr capsule Take 1 tablet PO QD for( 7 days)  then 2 tablets PO  QD  #60 60 capsule 2   No facility-administered medications prior to visit.    ROS Review of Systems  Constitutional: Negative for fever, chills, diaphoresis and fatigue.  Respiratory: Negative for chest tightness, shortness of breath and wheezing.   Cardiovascular: Negative for chest pain, palpitations and  leg swelling.  Gastrointestinal: Negative for nausea, vomiting and diarrhea.  Neurological: Positive for headaches. Negative for dizziness, weakness and numbness.       Recent increase of headaches.  Psychiatric/Behavioral: The patient is not nervous/anxious.        Stressed    Objective:  BP 138/98 mmHg  Pulse 84  Temp(Src) 98.2 F (36.8 C)  Resp 14  Ht 5' 5.5" (1.664 m)  Wt 203 lb 6.4 oz (92.262 kg)  BMI 33.32 kg/m2  SpO2 99%  Physical Exam  Constitutional: She is oriented to person, place, and time. She appears well-developed and well-nourished. No distress.  HENT:  Head: Normocephalic and atraumatic.  Right Ear: External ear normal.  Left Ear: External ear normal.  Cardiovascular: Normal rate, regular rhythm and normal heart sounds.   Pulmonary/Chest: Effort normal and breath sounds normal. No respiratory distress. She has no wheezes. She has no rales. She exhibits no tenderness.  Neurological: She is alert and oriented to person, place, and time. No cranial nerve deficit. She exhibits normal muscle tone. Coordination normal.  Skin: Skin is warm and dry. No rash noted. She is not diaphoretic.  Psychiatric: She has a normal mood and affect. Her behavior is normal. Judgment and thought content normal.   Assessment & Plan:   Larisa was seen today for follow-up.  Diagnoses and all orders for this visit:  Benign essential HTN -     Basic Metabolic Panel (BMET); Future  Other orders -     hydrochlorothiazide (HYDRODIURIL) 12.5 MG  tablet; Take 1 tablet (12.5 mg total) by mouth daily.   I am having Ms. Wimbush start on hydrochlorothiazide. I am also having her maintain her meloxicam, SUMAtriptan, venlafaxine XR, and cyclobenzaprine.  Meds ordered this encounter  Medications  . hydrochlorothiazide (HYDRODIURIL) 12.5 MG tablet    Sig: Take 1 tablet (12.5 mg total) by mouth daily.    Dispense:  30 tablet    Refill:  1    Order Specific Question:  Supervising Provider     Answer:  Crecencio Mc [[id number]]     Follow-up: Return if symptoms worsen or fail to improve.

## 2015-07-10 NOTE — Progress Notes (Signed)
Pre visit review using our clinic review tool, if applicable. No additional management support is needed unless otherwise documented below in the visit note. 

## 2015-07-15 ENCOUNTER — Encounter: Payer: Self-pay | Admitting: Nurse Practitioner

## 2015-07-15 DIAGNOSIS — I10 Essential (primary) hypertension: Secondary | ICD-10-CM | POA: Insufficient documentation

## 2015-07-15 NOTE — Assessment & Plan Note (Signed)
BP Readings from Last 3 Encounters:  07/10/15 138/98  05/09/15 128/74  04/10/15 144/90   Will start patient on HCTZ 12.5 mg daily. Asked her to take it in the morning due to diuresis. Will obtain be met in 2-3 weeks due to history of hypernatremia. We'll follow-up in the office with blood pressures.

## 2015-07-17 DIAGNOSIS — Z0279 Encounter for issue of other medical certificate: Secondary | ICD-10-CM

## 2015-07-24 ENCOUNTER — Other Ambulatory Visit (INDEPENDENT_AMBULATORY_CARE_PROVIDER_SITE_OTHER): Payer: 59

## 2015-07-24 ENCOUNTER — Telehealth: Payer: Self-pay

## 2015-07-24 DIAGNOSIS — I10 Essential (primary) hypertension: Secondary | ICD-10-CM | POA: Diagnosis not present

## 2015-07-24 LAB — BASIC METABOLIC PANEL
BUN: 15 mg/dL (ref 6–23)
CALCIUM: 9.9 mg/dL (ref 8.4–10.5)
CO2: 29 meq/L (ref 19–32)
Chloride: 99 mEq/L (ref 96–112)
Creatinine, Ser: 0.99 mg/dL (ref 0.40–1.20)
GFR: 81.54 mL/min (ref >60.00)
Glucose, Bld: 98 mg/dL (ref 70–99)
Potassium: 3.8 mEq/L (ref 3.5–5.1)
Sodium: 135 mEq/L (ref 135–145)

## 2015-07-24 NOTE — Telephone Encounter (Signed)
Gave paperwork to pt yesterday 10.31.16

## 2015-07-24 NOTE — Telephone Encounter (Signed)
-----   Message from Rubbie Battiest, NP sent at 07/24/2015  8:15 AM EDT ----- Please give patient back her FMLA re-cert paperwork. Thank you!

## 2015-08-14 ENCOUNTER — Ambulatory Visit: Payer: 59 | Admitting: Neurology

## 2015-09-28 ENCOUNTER — Encounter: Payer: Self-pay | Admitting: Neurology

## 2015-09-28 MED ORDER — VENLAFAXINE HCL ER 37.5 MG PO CP24: 75.0000 mg | ORAL_CAPSULE | Freq: Every day | ORAL | Status: DC

## 2015-09-28 NOTE — Telephone Encounter (Signed)
Last OV: 05/09/15 Next OV: 0/0/0 Had 08/14/15 appointment cancelled was to return in 3 months. Mychart message to pt to schedule.

## 2015-10-08 ENCOUNTER — Encounter: Payer: Self-pay | Admitting: Neurology

## 2015-10-09 ENCOUNTER — Telehealth: Payer: Self-pay

## 2015-10-09 ENCOUNTER — Encounter: Payer: Self-pay | Admitting: Neurology

## 2015-10-09 DIAGNOSIS — G43009 Migraine without aura, not intractable, without status migrainosus: Secondary | ICD-10-CM

## 2015-10-09 NOTE — Telephone Encounter (Signed)
Opened in error

## 2015-10-09 NOTE — Telephone Encounter (Signed)
P 

## 2015-10-11 ENCOUNTER — Other Ambulatory Visit: Payer: Self-pay | Admitting: Nurse Practitioner

## 2015-10-11 DIAGNOSIS — G43809 Other migraine, not intractable, without status migrainosus: Secondary | ICD-10-CM

## 2015-10-12 ENCOUNTER — Other Ambulatory Visit: Payer: 59 | Admitting: Nurse Practitioner

## 2015-10-12 DIAGNOSIS — D649 Anemia, unspecified: Secondary | ICD-10-CM

## 2015-10-17 ENCOUNTER — Other Ambulatory Visit (INDEPENDENT_AMBULATORY_CARE_PROVIDER_SITE_OTHER): Payer: 59

## 2015-10-17 ENCOUNTER — Other Ambulatory Visit (INDEPENDENT_AMBULATORY_CARE_PROVIDER_SITE_OTHER): Payer: 59 | Admitting: *Deleted

## 2015-10-17 DIAGNOSIS — D649 Anemia, unspecified: Secondary | ICD-10-CM

## 2015-10-17 DIAGNOSIS — G43809 Other migraine, not intractable, without status migrainosus: Secondary | ICD-10-CM

## 2015-10-17 LAB — HEMOGLOBIN A1C: Hgb A1c MFr Bld: 5.6 % (ref 4.6–6.5)

## 2015-10-17 LAB — CBC WITH DIFFERENTIAL/PLATELET
BASOS ABS: 0 10*3/uL (ref 0.0–0.1)
BASOS PCT: 0.7 % (ref 0.0–3.0)
EOS PCT: 6.2 % — AB (ref 0.0–5.0)
Eosinophils Absolute: 0.3 10*3/uL (ref 0.0–0.7)
HEMATOCRIT: 40 % (ref 36.0–46.0)
Hemoglobin: 13 g/dL (ref 12.0–15.0)
LYMPHS ABS: 1.6 10*3/uL (ref 0.7–4.0)
LYMPHS PCT: 35.5 % (ref 12.0–46.0)
MCHC: 32.5 g/dL (ref 30.0–36.0)
MCV: 91.4 fl (ref 78.0–100.0)
MONOS PCT: 6.5 % (ref 3.0–12.0)
Monocytes Absolute: 0.3 10*3/uL (ref 0.1–1.0)
NEUTROS ABS: 2.3 10*3/uL (ref 1.4–7.7)
Neutrophils Relative %: 51.1 % (ref 43.0–77.0)
PLATELETS: 228 10*3/uL (ref 150.0–400.0)
RBC: 4.38 Mil/uL (ref 3.87–5.11)
RDW: 14.1 % (ref 11.5–15.5)
WBC: 4.5 10*3/uL (ref 4.0–10.5)

## 2015-10-17 LAB — BASIC METABOLIC PANEL
BUN: 11 mg/dL (ref 6–23)
CHLORIDE: 104 meq/L (ref 96–112)
CO2: 28 meq/L (ref 19–32)
Calcium: 9.2 mg/dL (ref 8.4–10.5)
Creatinine, Ser: 0.91 mg/dL (ref 0.40–1.20)
GFR: 89.75 mL/min (ref >60.00)
GLUCOSE: 96 mg/dL (ref 70–99)
POTASSIUM: 4.1 meq/L (ref 3.5–5.1)
SODIUM: 138 meq/L (ref 135–145)

## 2015-10-17 LAB — FERRITIN: Ferritin: 21.7 ng/mL (ref 10.0–291.0)

## 2015-11-08 ENCOUNTER — Other Ambulatory Visit: Payer: Self-pay | Admitting: Nurse Practitioner

## 2015-11-08 MED ORDER — HYDROCHLOROTHIAZIDE 12.5 MG PO TABS: 12.5000 mg | ORAL_TABLET | Freq: Every day | ORAL | Status: DC

## 2015-11-12 ENCOUNTER — Other Ambulatory Visit: Payer: Self-pay | Admitting: Nurse Practitioner

## 2015-11-15 ENCOUNTER — Ambulatory Visit: Payer: Self-pay | Admitting: Neurology

## 2015-11-16 ENCOUNTER — Encounter: Payer: Self-pay | Admitting: *Deleted

## 2015-11-16 ENCOUNTER — Encounter: Payer: Self-pay | Admitting: Neurology

## 2015-11-16 ENCOUNTER — Ambulatory Visit (INDEPENDENT_AMBULATORY_CARE_PROVIDER_SITE_OTHER): Payer: 59 | Admitting: Neurology

## 2015-11-16 VITALS — BP 150/97 | HR 73 | Resp 18 | Ht 65.5 in | Wt 219.8 lb

## 2015-11-16 DIAGNOSIS — R519 Headache, unspecified: Secondary | ICD-10-CM | POA: Insufficient documentation

## 2015-11-16 DIAGNOSIS — G43111 Migraine with aura, intractable, with status migrainosus: Secondary | ICD-10-CM | POA: Diagnosis not present

## 2015-11-16 DIAGNOSIS — Z6837 Body mass index (BMI) 37.0-37.9, adult: Secondary | ICD-10-CM | POA: Insufficient documentation

## 2015-11-16 DIAGNOSIS — G43909 Migraine, unspecified, not intractable, without status migrainosus: Secondary | ICD-10-CM | POA: Diagnosis not present

## 2015-11-16 DIAGNOSIS — R0683 Snoring: Secondary | ICD-10-CM | POA: Diagnosis not present

## 2015-11-16 DIAGNOSIS — R51 Headache: Secondary | ICD-10-CM

## 2015-11-16 DIAGNOSIS — G44019 Episodic cluster headache, not intractable: Secondary | ICD-10-CM | POA: Diagnosis not present

## 2015-11-16 MED ORDER — SUMATRIPTAN SUCCINATE 11 MG/NOSEPC NA EXHP: 1.0000 | INHALANT_POWDER | Freq: Once | NASAL | Status: DC

## 2015-11-16 MED ORDER — SUMATRIPTAN SUCCINATE 11 MG/NOSEPC NA EXHP: 11.0000 mg | INHALANT_POWDER | Freq: Once | NASAL | Status: DC

## 2015-11-16 MED ORDER — BUPROPION HCL ER (SR) 100 MG PO TB12: 100.0000 mg | ORAL_TABLET | Freq: Every day | ORAL | Status: DC

## 2015-11-16 NOTE — Addendum Note (Signed)
Addended by: Rossie Muskrat L on: 11/16/2015 11:11 AM   Modules accepted: Orders, Medications

## 2015-11-16 NOTE — Progress Notes (Signed)
Faxed onzetra enrollment form to Sherril Cong ([employer]). Fax: [phone]. Received confirmation. Included office notes, rx onzetra, insurance card.

## 2015-11-16 NOTE — Progress Notes (Signed)
[facility]   Provider:  Larey Seat, M D  Referring Provider: Rubbie Battiest, NP Primary Care Physician:  Rubbie Battiest, NP  Chief Complaint  Patient presents with  . New Evaluation    Rm 11, alone. Here for evaluation of migraines. Saw Dr Tomi Likens but wanted to transfer care. She was not happy with care. She has had migraines for 10 or more years. Tried BP medication. Thought high BP was the cause at first. Migraines did not go away. Dr Tomi Likens put her on velafaxine. She has tried treximet, indomethacin, topamax, which she failed.     HPI:  Ariel Perez is a 35 y.o. female , seen here as a referral  from Dr. Flonnie Overman for Migraine work up , re - evaluation,   Ms. Buster was already evaluted by Mariana Arn, DO and his notes were reviewed today. He felt that the patient had non-intractable Migraine , chronic and cluster.   Dr. Tomi Likens started the patient on imitrex injections, Effexor as a preventive and her PCP provided  HCTZ and Flexaril.  She reported stabbing intraorbital or retro-orbital pain that may also be associated with her established diagnosis of intraocular pressure elevation. Her headaches have not woken her but on occasion she will wake up with a headache not from the headache. Usually her headaches are bilateral temporal and frontal with a pressure and throbbing sensation. Ms. ketch SS times been nauseated but usually her pain does not lead to vomiting. She has noticed facial tingling but not hemifacial. Tingling in the fingers or hands was not associated. She especially reports tingling around the lips perioral numbness. There is no diaphoresis or tearflow. 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   Onset: Since adolescence Location: Bi-frontal Quality: throbbing Intensity: 8/10 Aura: no Prodrome: no Associated symptoms: Photophobia, phonophobia, sees halos around lights, osmophobia. Duration: 7-10 days Frequency: Once a month.  Random. Not associated with her period. Triggers/exacerbating factors: Bananas, stress, perfumes Relieving factors: Quiet, rest Activity: Able to be active  Past abortive therapy: Excedrin Migraine, ibuprofen, Tylenol, naproxen Past preventative therapy: Topamax (side effects), gabapentin (side effects), atenolol (ineffective  Current abortive therapy: Treximet (takes edge off for short while and headache recurs), indomethacin 25mg  Current preventative therapy: Lamictal 25mg  Other medications: Lamictal 25mg , [facility]  Caffeine: Dr. 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 Family history of headache: no   Frequency of Headaches is now 2-3 times a week, dull, throbbing but sharp and stabbing.  Nausea and photophobia associated.  She notes some electric shock sensations in the fingers and hands, to the shoulders and down the back- these can be effexor withdraw symptoms.  She took Effexor once a day , if she missed a dose these symptoms are arising.  She failed Topamax  For prevention, and Relpax, indomethacin - at the time by Dr. Orie Rout.  Review of Systems: Out of a complete 14 system review, the patient complains of only the following symptoms, and all other reviewed systems are negative.    Social History   Social History  . Marital Status: Single    Spouse Name: N/A  . Number of Children: 0  . Years of Education: N/A   Occupational History  . [employer]    Social History Main Topics  . Smoking status: Never Smoker   .  Smokeless tobacco: Never Used  . Alcohol Use: 0.0 oz/week    0 Standard drinks or equivalent per week     Comment: occ  . Drug Use: No  . Sexual Activity:    Partners: Male     Comment: 1 partner   Other Topics Concern  . Not on file    Social History Narrative   Works here at [employer] by herself    2 dogs live inside    Caffeine- Occasional    Right handed    Enjoys sleeping     Family History  Problem Relation Age of Onset  . Cancer Father     unknown  . Diabetes Paternal Grandmother   . Hypertension Maternal Grandmother   . Rheum arthritis Maternal Grandmother   . Cancer Maternal Grandfather     Past Medical History  Diagnosis Date  . Headache   . Hypertension   . Osteoarthritis   . Migraines     Past Surgical History  Procedure Laterality Date  . Wisdom tooth extraction N/A 2008    Approx x4     Current Outpatient Prescriptions  Medication Sig Dispense Refill  . cyclobenzaprine (FLEXERIL) 10 MG tablet Take 1 tablet (10 mg total) by mouth at bedtime. 30 tablet 0  . hydrochlorothiazide (HYDRODIURIL) 12.5 MG tablet Take 1 tablet (12.5 mg total) by mouth daily. 30 tablet 1  . SUMAtriptan (IMITREX) 6 MG/0.5ML SOLN injection Inject 0.5 mLs (6 mg total) into the skin every 2 (two) hours as needed for migraine or headache (not to exceed 2 doses in 24 hours). May repeat in 2 hours if headache persists or recurs. 9 vial 1  . venlafaxine XR (EFFEXOR XR) 37.5 MG 24 hr capsule Take 2 capsules (75 mg total) by mouth daily with breakfast. Take 1 tablet PO QD for( 7 days)  then 2 tablets PO  QD  #60 60 capsule 0   No current facility-administered medications for this visit.    Allergies as of 11/16/2015  . (No Known Allergies)    Vitals: BP 150/97 mmHg  Pulse 73  Resp 18  Ht 5' 5.5" (1.664 m)  Wt 219 lb 12 oz (99.678 kg)  BMI 36.00 kg/m2 Last Weight:  Wt Readings from Last 1 Encounters:  11/16/15 219 lb 12 oz (99.678 kg)   [id number] mass index is 36 kg/(m^2).     Last Height:   Ht Readings from Last 1 Encounters:  11/16/15 5' 5.5" (1.664 m)    Physical exam:  General: The patient is awake, alert and appears not in acute distress. The patient is well groomed. Head: Normocephalic,  atraumatic. Neck is supple. Mallampati 3 uvula ot lifted above tongue ground. ,  neck circumference 16. Nasal airflow  unrestricted , TMJ - Click  evident . Retrognathia not seen.  Cardiovascular:  Regular rate and rhythm without  murmurs or carotid bruit, and without distended neck veins. Respiratory: Lungs are clear to auscultation. Skin:  Without evidence of edema, or rash Trunk: BMI is elevated . The patient's posture is erect.   Neurologic exam : The patient is awake and alert, oriented to place and time.   Memory subjective described as intact.  Attention span & concentration ability appears normal.  Speech is fluent,  Without dysarthria, dysphonia or aphasia.  Mood and affect are appropriate.  Cranial nerves: Pupils are equal and briskly reactive to light. Funduscopic exam without evidence of pallor or edema. Extraocular movements  in vertical and horizontal planes  intact and without nystagmus. Visual fields by finger perimetry are intact. Hearing to finger rub intact.   Facial sensation intact to fine touch.  Facial motor strength is symmetric and tongue and uvula move midline. Shoulder shrug was symmetrical.   Motor exam:   Normal tone, muscle bulk and symmetric strength in all extremities.  Sensory:  Fine touch, pinprick and vibration were tested in all extremities.  Proprioception tested in the upper extremities was normal.  Coordination: Rapid alternating movements in the fingers/hands /Finger-to-nose maneuver are normal without evidence of ataxia, dysmetria or tremor.  Gait and station: Patient walks without assistive device and is able unassisted to climb up to the exam table. Strength within normal limits.  Stance is stable and normal.  Deep tendon reflexes: in the  upper and lower extremities are symmetric and intact. Babinski maneuver response is  downgoing.  The patient was advised of the nature of the diagnosed sleep disorder , the treatment options and risks for  general a health and wellness arising from not treating the condition.  I spent more than 45 minutes of face to face time with the patient. Greater than 50% of time was spent in counseling and coordination of care. We have discussed the diagnosis and differential and I answered the patient's questions.     Assessment:  After physical and neurologic examination, review of laboratory studies,  Personal review of imaging studies, reports of other /same  Imaging studies ,  Results of polysomnography/ neurophysiology testing and pre-existing records as far as provided in visit., my assessment is   1) the patient has a history of migraine headaches associated with photophobia and nausea but without a lateralization. These occur still 2-3 times a week and prevention under Effexor has not worked well for her. The headaches are too frequent. She also has an additional cluster component with a sharp intraocular stabbing pain.  2) the patient held an additional painful dysesthesia to her hands and fingers with a tingling numbness and some electric shock sensation that would travel down her spine. I believe these are fracture of his straw symptoms she has taken Effexor nightly but occasionally has forgotten and she is at high risk of having these exact symptoms in response to not taking Effexor. I would therefore like her to wean off Effexor and start a different medication.  3) Mrs. schnepp is a high risk for sleep apnea he is reportedly a snorer she has an elevated body mass index, high-grade Mallampati and a larger neck circumference at 16 inches. I begin will be enough for her to have a home sleep test just to verify and sceen for apnea. Sleep hypoxemia puts her at higher risk of having cluster headaches.    Plan:  Treatment plan and additional workup :  #1 I would like for Ms. Schnoor to be weaned off Effexor she is on a very low-dose 37.5 mg and my feeling is that became stop it but make her aware that she  may experience the electric tingling sensations for 3-5 days. I will change her either to Pristiq which will not cause the side effects before like for her to start on a low-dose of Wellbutrin in the morning.   This will also help her to not feel as sleepy or fatigued in daytime which is another concern.  2) I will order a home sleep test to screen for apnea and hypoxemia as these are risk factors for cluster headaches and other sleep related headaches. She has woken  up frequently with a headache - but  she has not been woken by a headache.  3) Obesity- weight reduction is important for vascular headache patients. Low carb and moderate exercise will help to achieve a lower BMI.   Imitrex injections have a very short interval to act, however in her work environment it would be easier for her to inhale the Imitrex powder.  It works even quicker and it requires no needle staying. She would be in favor of changing to this form of the same medication.  We introduced the [facility]  today, the patient will receive it through a speciality pharmacy .  Our RN, Rossie Muskrat gave the patient the necessary paper work.   Asencion Partridge Ameya Vowell MD  11/16/2015   CC: Rubbie Battiest, [id number] [place], [place] [id number]

## 2015-11-16 NOTE — Patient Instructions (Signed)
Sleep Apnea Sleep apnea is disorder that affects a person's sleep. A person with sleep apnea has abnormal pauses in their breathing when they sleep. It is hard for them to get a good sleep. This makes a person tired during the day. It also can lead to other physical problems. There are three types of sleep apnea. One type is when breathing stops for a short time because your airway is blocked (obstructive sleep apnea). Another type is when the brain sometimes fails to give the normal signal to breathe to the muscles that control your breathing (central sleep apnea). The third type is a combination of the other two types. HOME CARE  Do not sleep on your back. Try to sleep on your side.  Take all medicine as told by your doctor.  Avoid alcohol, calming medicines (sedatives), and depressant drugs.  Try to lose weight if you are overweight. Talk to your doctor about a healthy weight goal. Your doctor may have you use a device that helps to open your airway. It can help you get the air that you need. It is called a positive airway pressure (PAP) device. There are three types of PAP devices:  Continuous positive airway pressure (CPAP) device.  Nasal expiratory positive airway pressure (EPAP) device.  Bilevel positive airway pressure (BPAP) device. MAKE SURE YOU:  Understand these instructions.  Will watch your condition.  Will get help right away if you are not doing well or get worse.   This information is not intended to replace advice given to you by your health care provider. Make sure you discuss any questions you have with your health care provider.   Document Released: 06/17/2008 Document Revised: 09/29/2014 Document Reviewed: 01/10/2012 Elsevier Interactive Patient Education 2016 Elsevier Inc. Sumatriptan nasal powder What is this medicine? SUMATRIPTAN (soo ma TRIP tan) is used to treat migraines with or without aura. An aura is a strange feeling or visual disturbance that warns  you of an attack. It is not used to prevent migraines. This medicine may be used for other purposes; ask your health care provider or pharmacist if you have questions. What should I tell my health care provider before I take this medicine? They need to know if you have any of these conditions: -circulation problems in fingers and toes -diabetes -heart disease -high blood pressure -high cholesterol -history of irregular heartbeat -history of stroke -kidney disease -liver disease -postmenopausal or surgical removal of uterus and ovaries -seizures -smoke tobacco -stomach or intestine problems -an unusual or allergic reaction to sumatriptan, other medicines, foods, dyes, or preservatives -pregnant or trying to get pregnant -breast-feeding How should I use this medicine? This medicine is for use in the nose. Follow the directions on the prescription label. This medicine is taken at the first symptoms of a migraine. It is not for everyday use. Do not take your medicine more often than directed. Talk to your pediatrician regarding the use of this medicine in children. Special care may be needed. Overdosage: If you think you have taken too much of this medicine contact a poison control center or emergency room at once. NOTE: This medicine is only for you. Do not share this medicine with others. What if I miss a dose? This does not apply; this medicine is not for regular use. What may interact with this medicine? Do not take this medicine with any of the following medicines: -cocaine -ergot alkaloids like dihydroergotamine, ergonovine, ergotamine, methylergonovine -feverfew -MAOIs like Carbex, Eldepryl, Marplan, Nardil, and Parnate -other  medicines for migraine headache like almotriptan, eletriptan, frovatriptan, naratriptan, rizatriptan, zolmitriptan -tryptophan This medicine may also interact with the following medications: -certain medicines for depression, anxiety, or psychotic  disturbances This list may not describe all possible interactions. Give your health care provider a list of all the medicines, herbs, non-prescription drugs, or dietary supplements you use. Also tell them if you smoke, drink alcohol, or use illegal drugs. Some items may interact with your medicine. What should I watch for while using this medicine? Only take this medicine for a migraine headache. Take it if you get warning symptoms or at the start of a migraine attack. It is not for regular use to prevent migraine attacks. You may get drowsy or dizzy. Do not drive, use machinery, or do anything that needs mental alertness until you know how this medicine affects you. Do not stand or sit up quickly, especially if you are an older patient. This reduces the risk of dizzy or fainting spells. Alcohol may interfere with the effect of this medicine. Avoid alcoholic drinks. Smoking cigarettes may increase the risk of heart-related side effects from using this medicine. If you take migraine medicines for 10 or more days a month, your migraines may get worse. Keep a diary of headache days and medicine use. Contact your healthcare professional if your migraine attacks occur more frequently. What side effects may I notice from receiving this medicine? Side effects that you should report to your doctor or health care professional as soon as possible: -allergic reactions like skin rash, itching or hives, swelling of the face, lips, or tongue -bloody or watery diarrhea -hallucination, loss of contact with reality -pain, tingling, numbness in the face, hands, or feet -seizures -signs and symptoms of a blood clot such as breathing problems; changes in vision; chest pain; severe, sudden headache; pain, swelling, warmth in the leg; trouble speaking; sudden numbness or weakness of the face, arm, or leg -signs and symptoms of a dangerous change in heartbeat or heart rhythm like chest pain; dizziness; fast or irregular  heartbeat; palpitations, feeling faint or lightheaded; falls; breathing problems -signs and symptoms of a stroke like changes in vision; confusion; trouble speaking or understanding; severe headaches; sudden numbness or weakness of the face, arm, or leg; trouble walking; dizziness; loss of balance or coordination -stomach pain Side effects that usually do not require medical attention (report these to your doctor or health care professional if they continue or are bothersome): -changes in taste -facial flushing -headache -muscle cramps -muscle pain -nausea, vomiting -weak or tired This list may not describe all possible side effects. Call your doctor for medical advice about side effects. You may report side effects to FDA at 1-800-FDA-1088. Where should I keep my medicine? Keep out of the reach of children. Store at room temperature between 15 and 30 degrees C (59 and 86 degrees F). Throw away any unused medicine after the expiration date. NOTE: This sheet is a summary. It may not cover all possible information. If you have questions about this medicine, talk to your doctor, pharmacist, or health care provider.    2016, Elsevier/Gold Standard. (2015-05-08 16:02:30)

## 2015-11-19 NOTE — Progress Notes (Signed)
Received a notice from Opal Sidles at [employer] that the pt's PA for Helmut Muster has been approved.

## 2015-11-20 ENCOUNTER — Telehealth: Payer: Self-pay | Admitting: Neurology

## 2015-11-20 NOTE — Telephone Encounter (Signed)
[facility] require

## 2015-11-21 ENCOUNTER — Encounter: Payer: Self-pay | Admitting: Neurology

## 2015-11-21 NOTE — Addendum Note (Signed)
Addended by: Lester Windsor A on: 11/21/2015 01:10 PM   Modules accepted: Orders

## 2015-11-21 NOTE — Progress Notes (Signed)
Received a verbal order from Dr. Brett Fairy to change the sleep study to a split night since this study will not require an authorization like the home sleep test would.

## 2015-11-22 NOTE — Telephone Encounter (Signed)
Trazodone, Seroquel, Desyrel,  Elavil.   Please discuss chronic  insomnia treatment with your psychologist- we can make a referral if needed.

## 2015-12-10 ENCOUNTER — Ambulatory Visit (INDEPENDENT_AMBULATORY_CARE_PROVIDER_SITE_OTHER): Payer: 59 | Admitting: Nurse Practitioner

## 2015-12-10 ENCOUNTER — Encounter: Payer: Self-pay | Admitting: Nurse Practitioner

## 2015-12-10 VITALS — BP 128/86 | HR 91 | Temp 97.7°F | Resp 14 | Ht 65.5 in | Wt 200.0 lb

## 2015-12-10 DIAGNOSIS — T7840XA Allergy, unspecified, initial encounter: Secondary | ICD-10-CM | POA: Diagnosis not present

## 2015-12-10 MED ORDER — METHYLPREDNISOLONE ACETATE 80 MG/ML IJ SUSP
80.0000 mg | Freq: Once | INTRAMUSCULAR | Status: AC
  Administered 2015-12-10: 80 mg via INTRAMUSCULAR

## 2015-12-10 MED ORDER — PREDNISONE 10 MG PO TABS: ORAL_TABLET | ORAL | Status: DC

## 2015-12-10 NOTE — Progress Notes (Signed)
Patient ID: Ariel Perez, female    DOB: 1981/08/28  Age: 35 y.o. MRN: [id number]  CC: Rash   HPI Ariel Perez presents for CC of rash.  1) Pt used a new conditioner with mango She is now having all over hives Denies trouble breathing, speaking, swallowing or lips swelling.  Benadryl- knocks her out No other treatment to date  History Ariel Perez has a past medical history of Headache; Hypertension; Osteoarthritis; and Migraines.   She has past surgical history that includes Wisdom tooth extraction (N/A, 2008).   Her family history includes Cancer in her father and maternal grandfather; Diabetes in her paternal grandmother; Hypertension in her maternal grandmother; Rheum arthritis in her maternal grandmother.She reports that she has never smoked. She has never used smokeless tobacco. She reports that she drinks alcohol. She reports that she does not use illicit drugs.  Outpatient Prescriptions Prior to Visit  Medication Sig Dispense Refill  . buPROPion (WELLBUTRIN SR) 100 MG 12 hr tablet Take 1 tablet (100 mg total) by mouth daily. 30 tablet 0  . cyclobenzaprine (FLEXERIL) 10 MG tablet Take 1 tablet (10 mg total) by mouth at bedtime. 30 tablet 0  . hydrochlorothiazide (HYDRODIURIL) 12.5 MG tablet Take 1 tablet (12.5 mg total) by mouth daily. 30 tablet 1  . SUMAtriptan (IMITREX) 6 MG/0.5ML SOLN injection Inject 0.5 mLs (6 mg total) into the skin every 2 (two) hours as needed for migraine or headache (not to exceed 2 doses in 24 hours). May repeat in 2 hours if headache persists or recurs. 9 vial 1  . SUMAtriptan Succinate (ONZETRA XSAIL) 11 MG/NOSEPC EXHP Place 1 Dose into both nostrils once. Administer 2 nosepieces (1 per nostril) at onset of migraine, may repeat dose after 2 hours. Do not exceed 2 doses in 24 hours. 1 each 11   No facility-administered medications prior to visit.    ROS Review of Systems  Constitutional: Negative for fever, chills, diaphoresis and fatigue.   HENT: Negative for trouble swallowing and voice change.   Respiratory: Negative for chest tightness, shortness of breath and wheezing.   Cardiovascular: Negative for chest pain, palpitations and leg swelling.  Gastrointestinal: Negative for nausea, vomiting and diarrhea.  Skin: Positive for rash.  Neurological: Negative for dizziness and headaches.    Objective:  BP 128/86 mmHg  Pulse 91  Temp(Src) 97.7 F (36.5 C) (Oral)  Resp 14  Ht 5' 5.5" (1.664 m)  Wt 200 lb (90.719 kg)  BMI 32.76 kg/m2  SpO2 99%  Physical Exam  Constitutional: She is oriented to person, place, and time. She appears well-developed and well-nourished. No distress.  HENT:  Head: Normocephalic and atraumatic.  Right Ear: External ear normal.  Left Ear: External ear normal.  Mouth/Throat: Oropharynx is clear and moist. No oropharyngeal exudate.  Cardiovascular: Normal rate, regular rhythm and normal heart sounds.  Exam reveals no gallop and no friction rub.   No murmur heard. Pulmonary/Chest: Effort normal and breath sounds normal. No respiratory distress. She has no wheezes. She has no rales. She exhibits no tenderness.  Neurological: She is alert and oriented to person, place, and time.  Skin: Skin is warm and dry. Rash noted. She is not diaphoretic.  Erythema, urticaria, and excoriations on thighs, arms, and trunk  Psychiatric: She has a normal mood and affect. Her behavior is normal. Judgment and thought content normal.   Assessment & Plan:   [place] was seen today for rash.  Diagnoses and all orders for this visit:  Allergic  reaction, initial encounter -     methylPREDNISolone acetate (DEPO-MEDROL) injection 80 mg; Inject 1 mL (80 mg total) into the muscle once.  Other orders -     predniSONE (DELTASONE) 10 MG tablet; Take 6 tablets by mouth on day 1 then decrease by 1 tablet each day until gone.   I am having Ms. Esperanza start on predniSONE. I am also having her maintain her SUMAtriptan,  cyclobenzaprine, hydrochlorothiazide, buPROPion, and SUMAtriptan Succinate. We administered methylPREDNISolone acetate.  Meds ordered this encounter  Medications  . methylPREDNISolone acetate (DEPO-MEDROL) injection 80 mg    Sig:   . predniSONE (DELTASONE) 10 MG tablet    Sig: Take 6 tablets by mouth on day 1 then decrease by 1 tablet each day until gone.    Dispense:  21 tablet    Refill:  0    Order Specific Question:  Supervising Provider    Answer:  Crecencio Mc [[id number]]     Follow-up: Return if symptoms worsen or fail to improve.

## 2015-12-10 NOTE — Patient Instructions (Signed)
Contact Dermatitis Dermatitis is redness, soreness, and swelling (inflammation) of the skin. Contact dermatitis is a reaction to certain substances that touch the skin. There are two types of contact dermatitis:   Irritant contact dermatitis. This type is caused by something that irritates your skin, such as dry hands from washing them too much. This type does not require previous exposure to the substance for a reaction to occur. This type is more common.  Allergic contact dermatitis. This type is caused by a substance that you are allergic to, such as a nickel allergy or poison ivy. This type only occurs if you have been exposed to the substance (allergen) before. Upon a repeat exposure, your body reacts to the substance. This type is less common. CAUSES  Many different substances can cause contact dermatitis. Irritant contact dermatitis is most commonly caused by exposure to:   Makeup.   Soaps.   Detergents.   Bleaches.   Acids.   Metal salts, such as nickel.  Allergic contact dermatitis is most commonly caused by exposure to:   Poisonous plants.   Chemicals.   Jewelry.   Latex.   Medicines.   Preservatives in products, such as clothing.  RISK FACTORS This condition is more likely to develop in:   People who have jobs that expose them to irritants or allergens.  People who have certain medical conditions, such as asthma or eczema.  SYMPTOMS  Symptoms of this condition may occur anywhere on your body where the irritant has touched you or is touched by you. Symptoms include:  Dryness or flaking.   Redness.   Cracks.   Itching.   Pain or a burning feeling.   Blisters.  Drainage of small amounts of blood or clear fluid from skin cracks. With allergic contact dermatitis, there may also be swelling in areas such as the eyelids, mouth, or genitals.  DIAGNOSIS  This condition is diagnosed with a medical history and physical exam. A patch skin test  may be performed to help determine the cause. If the condition is related to your job, you may need to see an occupational medicine specialist. TREATMENT Treatment for this condition includes figuring out what caused the reaction and protecting your skin from further contact. Treatment may also include:   Steroid creams or ointments. Oral steroid medicines may be needed in more severe cases.  Antibiotics or antibacterial ointments, if a skin infection is present.  Antihistamine lotion or an antihistamine taken by mouth to ease itching.  A bandage (dressing). HOME CARE INSTRUCTIONS Skin Care  Moisturize your skin as needed.   Apply cool compresses to the affected areas.  Try taking a bath with:  Epsom salts. Follow the instructions on the packaging. You can get these at your local pharmacy or grocery store.  Baking soda. Pour a small amount into the bath as directed by your health care provider.  Colloidal oatmeal. Follow the instructions on the packaging. You can get this at your local pharmacy or grocery store.  Try applying baking soda paste to your skin. Stir water into baking soda until it reaches a paste-like consistency.  Do not scratch your skin.  Bathe less frequently, such as every other day.  Bathe in lukewarm water. Avoid using hot water. Medicines  Take or apply over-the-counter and prescription medicines only as told by your health care provider.   If you were prescribed an antibiotic medicine, take or apply your antibiotic as told by your health care provider. Do not stop using the   antibiotic even if your condition starts to improve. General Instructions  Keep all follow-up visits as told by your health care provider. This is important.  Avoid the substance that caused your reaction. If you do not know what caused it, keep a journal to try to track what caused it. Write down:  What you eat.  What cosmetic products you use.  What you drink.  What  you wear in the affected area. This includes jewelry.  If you were given a dressing, take care of it as told by your health care provider. This includes when to change and remove it. SEEK MEDICAL CARE IF:   Your condition does not improve with treatment.  Your condition gets worse.  You have signs of infection such as swelling, tenderness, redness, soreness, or warmth in the affected area.  You have a fever.  You have new symptoms. SEEK IMMEDIATE MEDICAL CARE IF:   You have a severe headache, neck pain, or neck stiffness.  You vomit.  You feel very sleepy.  You notice red streaks coming from the affected area.  Your bone or joint underneath the affected area becomes painful after the skin has healed.  The affected area turns darker.  You have difficulty breathing.   This information is not intended to replace advice given to you by your health care provider. Make sure you discuss any questions you have with your health care provider.   Document Released: 09/05/2000 Document Revised: 05/30/2015 Document Reviewed: 01/24/2015 Elsevier Interactive Patient Education 2016 Elsevier Inc.  

## 2015-12-13 ENCOUNTER — Telehealth: Payer: Self-pay | Admitting: Neurology

## 2015-12-13 DIAGNOSIS — R519 Headache, unspecified: Secondary | ICD-10-CM

## 2015-12-13 DIAGNOSIS — R51 Headache: Principal | ICD-10-CM

## 2015-12-13 NOTE — Telephone Encounter (Signed)
Patient will not do the split and I did get authorization for the HST so if you would please put the HST order back in then I can schedule her.

## 2015-12-14 DIAGNOSIS — T7840XA Allergy, unspecified, initial encounter: Secondary | ICD-10-CM | POA: Insufficient documentation

## 2015-12-14 NOTE — Assessment & Plan Note (Addendum)
New to me Stop using the offending product Depo-Medrol 80 mg IM in the office Pt tolerated well Taper sent to pharmacy in case needed over the week FU prn worsening/failure to improve.

## 2015-12-17 ENCOUNTER — Other Ambulatory Visit: Payer: Self-pay | Admitting: Nurse Practitioner

## 2015-12-17 NOTE — Telephone Encounter (Signed)
Noted  

## 2015-12-20 ENCOUNTER — Encounter: Payer: Self-pay | Admitting: Neurology

## 2015-12-20 ENCOUNTER — Encounter (INDEPENDENT_AMBULATORY_CARE_PROVIDER_SITE_OTHER): Payer: 59 | Admitting: Neurology

## 2015-12-20 DIAGNOSIS — R519 Headache, unspecified: Secondary | ICD-10-CM

## 2015-12-20 DIAGNOSIS — G4733 Obstructive sleep apnea (adult) (pediatric): Secondary | ICD-10-CM | POA: Diagnosis not present

## 2015-12-20 DIAGNOSIS — R51 Headache: Principal | ICD-10-CM

## 2015-12-20 DIAGNOSIS — Z6834 Body mass index (BMI) 34.0-34.9, adult: Secondary | ICD-10-CM | POA: Diagnosis not present

## 2015-12-20 DIAGNOSIS — Z01419 Encounter for gynecological examination (general) (routine) without abnormal findings: Secondary | ICD-10-CM | POA: Diagnosis not present

## 2015-12-21 NOTE — Telephone Encounter (Signed)
Dear Mrs. Lungren, The itching is not a side effect of Wellbutrin I have experienced with my patients. Usually, feeling "antsy " or anxious after intake time is reported. I would ask you to stop the wellbutrin and see if the itching is related by withholding the possible offending agent.    Demya Scruggs. MD  send by my chart direct. CD

## 2015-12-26 ENCOUNTER — Telehealth: Payer: Self-pay

## 2015-12-26 DIAGNOSIS — N951 Menopausal and female climacteric states: Secondary | ICD-10-CM

## 2015-12-26 DIAGNOSIS — G4701 Insomnia due to medical condition: Secondary | ICD-10-CM

## 2015-12-26 MED ORDER — DESVENLAFAXINE SUCCINATE ER 50 MG PO TB24: 50.0000 mg | ORAL_TABLET | Freq: Every day | ORAL | Status: DC

## 2015-12-26 NOTE — Telephone Encounter (Signed)
I spoke to pt and advised her that Dr. Brett Fairy recommends trying pristiq which may help with insomnia and headaches but it may require a pa. Pt states that she has already tried and failed effexor. I advised her that the RX was sent to her pharmacy. Pt verbalized understanding.

## 2015-12-26 NOTE — Telephone Encounter (Signed)
Pt returned call

## 2015-12-26 NOTE — Telephone Encounter (Signed)
Called pt to discuss sleep study results. No answer, left a message asking her to call me back. 

## 2015-12-26 NOTE — Telephone Encounter (Signed)
Spoke to pt regarding her sleep study results. I advised her that her HST did not reveal any apnea, hypopnea or hypoxemia which could be related to/explaining the sleep headaches. Weight loss and positional therapy are still recommended. Pt verbalized understanding.  Pt states that the Wellbutrin that Dr. Brett Fairy started her on for headaches caused extreme itching and she needed a cortisone shot to relieve the itching. She has since stopped the Wellbutrin. She is asking for another medication to be prescribed for her headaches since the Wellbutrin did not help. It was mentioned at her last office visit that the next option may be pristiq. I advised pt that I would send this message to Dr. Brett Fairy and that I would call the pt back with Dr. Edwena Felty recommendations.  A follow up appt was made for 01/28/16 at 1:30.

## 2015-12-26 NOTE — Telephone Encounter (Signed)
I'm very sorry to hear that the Wellbutrin caused her side effects. While I recommend Pristiq as an antidepressant that can help with insomnia and headaches, most insurances will expect the patient to fail Effexor first. It is also possible that her insurance will not cover Pristiq. Nonetheless, I will write for 50 mg daily and we will see if a  preauthorization is needed.  I usually can use a mental possible symptom complex as a justification for Pristiq prescription .CD

## 2015-12-27 DIAGNOSIS — H5213 Myopia, bilateral: Secondary | ICD-10-CM | POA: Diagnosis not present

## 2016-01-13 DIAGNOSIS — Z7689 Persons encountering health services in other specified circumstances: Secondary | ICD-10-CM

## 2016-01-28 ENCOUNTER — Ambulatory Visit (INDEPENDENT_AMBULATORY_CARE_PROVIDER_SITE_OTHER): Payer: 59 | Admitting: Neurology

## 2016-01-28 ENCOUNTER — Encounter: Payer: Self-pay | Admitting: Neurology

## 2016-01-28 VITALS — BP 120/78 | HR 76 | Resp 20 | Ht 65.0 in | Wt 220.0 lb

## 2016-01-28 DIAGNOSIS — R110 Nausea: Secondary | ICD-10-CM | POA: Diagnosis not present

## 2016-01-28 DIAGNOSIS — G43011 Migraine without aura, intractable, with status migrainosus: Secondary | ICD-10-CM | POA: Insufficient documentation

## 2016-01-28 DIAGNOSIS — G44019 Episodic cluster headache, not intractable: Secondary | ICD-10-CM | POA: Diagnosis not present

## 2016-01-28 NOTE — Patient Instructions (Signed)
OnabotulinumtoxinA injection (Medical Use) What is this medicine? ONABOTULINUMTOXINA (o na BOTT you lye num tox in eh) is a neuro-muscular blocker. This medicine is used to treat crossed eyes, eyelid spasms, severe neck muscle spasms, ankle and toe muscle spasms, and elbow, wrist, and finger muscle spasms. It is also used to treat excessive underarm sweating, to prevent chronic migraine headaches, and to treat loss of bladder control due to neurologic conditions such as multiple sclerosis or spinal cord injury. This medicine may be used for other purposes; ask your health care provider or pharmacist if you have questions. What should I tell my health care provider before I take this medicine? They need to know if you have any of these conditions: -breathing problems -cerebral palsy spasms -difficulty urinating -heart problems -history of surgery where this medicine is going to be used -infection at the site where this medicine is going to be used -myasthenia gravis or other neurologic disease -nerve or muscle disease -surgery plans -take medicines that treat or prevent blood clots -thyroid problems -an unusual or allergic reaction to botulinum toxin, albumin, other medicines, foods, dyes, or preservatives -pregnant or trying to get pregnant -breast-feeding How should I use this medicine? This medicine is for injection into a muscle. It is given by a health care professional in a hospital or clinic setting. Talk to your pediatrician regarding the use of this medicine in children. While this drug may be prescribed for children as young as 9 years old for selected conditions, precautions do apply. Overdosage: If you think you have taken too much of this medicine contact a poison control center or emergency room at once. NOTE: This medicine is only for you. Do not share this medicine with others. What if I miss a dose? This does not apply. What may interact with this  medicine? -aminoglycoside antibiotics like gentamicin, neomycin, tobramycin -muscle relaxants -other botulinum toxin injections This list may not describe all possible interactions. Give your health care provider a list of all the medicines, herbs, non-prescription drugs, or dietary supplements you use. Also tell them if you smoke, drink alcohol, or use illegal drugs. Some items may interact with your medicine. What should I watch for while using this medicine? Visit your doctor for regular check ups. This medicine will cause weakness in the muscle where it is injected. Tell your doctor if you feel unusually weak in other muscles. Get medical help right away if you have problems with breathing, swallowing, or talking. This medicine might make your eyelids droop or make you see blurry or double. If you have weak muscles or trouble seeing do not drive a car, use machinery, or do other dangerous activities. This medicine contains albumin from human blood. It may be possible to pass an infection in this medicine, but no cases have been reported. Talk to your doctor about the risks and benefits of this medicine. If your activities have been limited by your condition, go back to your regular routine slowly after treatment with this medicine. What side effects may I notice from receiving this medicine? Side effects that you should report to your doctor or health care professional as soon as possible: -allergic reactions like skin rash, itching or hives, swelling of the face, lips, or tongue -breathing problems -changes in vision -chest pain or tightness -eye irritation, pain -fast, irregular heartbeat -infection -numbness -speech problems -swallowing problems -unusual weakness Side effects that usually do not require medical attention (report to your doctor or health care professional if they continue  or are bothersome): -bruising or pain at site where injected -drooping eyelid -dry eyes or  mouth -headache -muscles aches, pains -sensitivity to light -tearing This list may not describe all possible side effects. Call your doctor for medical advice about side effects. You may report side effects to FDA at 1-800-FDA-1088. Where should I keep my medicine? This drug is given in a hospital or clinic and will not be stored at home. NOTE: This sheet is a summary. It may not cover all possible information. If you have questions about this medicine, talk to your doctor, pharmacist, or health care provider.    2016, Elsevier/Gold Standard. (2014-10-17 15:43:53)

## 2016-01-28 NOTE — Progress Notes (Signed)
[facility]INE CL[facility]IC   Provider:  Larey Seat, M D  Referring Provider: Rubbie Battiest, NP Primary Care Physician:  Rubbie Battiest, NP  Chief Complaint  Patient presents with  . Follow-up    pristiq not working, discuss HST results, rm 67, alone    HPI:  Ariel Perez is a 35 y.o. female , seen here as a referral  from Dr. Flonnie Overman for Migraine work up , re - evaluation,   Ariel Perez was already evaluted by Mariana Arn, DO and his notes were reviewed today. He felt that the patient had non-intractable Migraine ,  headaches of chronic and cluster quality .   Dr. Tomi Likens started the patient on imitrex injections, Effexor as a preventive and her PCP provided  HCTZ and Flexaril.  She reported stabbing intraorbital or retro-orbital pain that may also be associated with her established diagnosis of intraocular pressure elevation. Her headaches have not woken her but on occasion she will wake up with a headache not from the headache. Usually her headaches are bilateral temporal and frontal with a pressure and throbbing sensation. Ms. sharber SS times been nauseated but usually her pain does not lead to vomiting. She has noticed facial tingling but not hemifacial. Tingling in the fingers or hands was not associated. She especially reports tingling around the lips perioral numbness. There is no diaphoresis or tearflow. She works in a [employer] in [employer], in [place].   Onset: Since adolescence Location: Bi-frontal Quality: throbbing, stabbing, pounding  Intensity: 8/10 Aura / Prodrome: no Associated symptoms: Photophobia, phonophobia, sees halos around lights while headache is present, osmophobia. nausea.  Duration: 7-10 days Frequency: Once a month. Random. Not associated with her period. Triggers/exacerbating factors: Bananas, stress, perfumes Relieving factors: Quiet, rest Activity: Able to be active Past abortive therapy: Excedrin Migraine, ibuprofen,  Tylenol, naproxen Past preventative therapy: Topamax (side effects), gabapentin (side effects), atenolol. Ineffective were  indomethacin, lamictal, pristiq.   Frequency of Headaches is now 2-3 times a week, dull, throbbing but sharp and stabbing.  Nausea and photophobia associated.  She notes some electric shock sensations in the fingers and hands, to the shoulders and down the back- these can be effexor withdraw symptoms.  She took Effexor once a day , if she missed a dose these symptoms are arising.  She failed Topamax for prevention, and Relpax, indomethacin - at the time by Dr. Orie Rout.  Interval history 04-26-2016, Ariel Perez reports today that her headaches are still present.  The frequency has increased.  A lot of headaches arise during her work hours. She keeps a dull headache throughout the day. The Pristiq has not touched it.  HST - 12-20-2015 She is not excessively daytime sleepy and endorsed the Epworth score at 6 at the fatigue severity at 20 points, he also here today to follow-up on her sleep study which was a home sleep test. The AHI was 0.8 the patient had no significant oxygen desaturations she had irregular heart rate on nights. The study failed to identify any contributing factors to her headaches. We are discussing today the possibilty of Botox injections in the treatment of headaches. She has very tight shoulders, her neck feels stiff.      Review of Systems: Out of a complete 14 system review, the patient complains of only the following symptoms, and all other reviewed systems are negative.  The patient reports headaches almost every day she will keep a dull headache in the background but a more  severe headache will arise 3-5 times a week, often during work hours. Nausea yes, vomiting no   Social History   Social History  . Marital Status: Single    Spouse Name: N/A  . Number of Children: 0  . Years of Education: N/A   Occupational History  . [employer]  [employer] care    Social History Main Topics  . Smoking status: Never Smoker   . Smokeless tobacco: Never Used  . Alcohol Use: 0.0 oz/week    0 Standard drinks or equivalent per week     Comment: occ  . Drug Use: No  . Sexual Activity:    Partners: Male     Comment: 1 partner   Other Topics Concern  . Not on file   Social History Narrative   Works here at [employer] by herself    2 dogs live inside    Caffeine- Occasional    Right handed    Enjoys sleeping     Family History  Problem Relation Age of Onset  . Cancer Father     unknown  . Diabetes Paternal Grandmother   . Hypertension Maternal Grandmother   . Rheum arthritis Maternal Grandmother   . Cancer Maternal Grandfather     Past Medical History  Diagnosis Date  . Headache   . Hypertension   . Osteoarthritis   . Migraines     Past Surgical History  Procedure Laterality Date  . Wisdom tooth extraction N/A 2008    Approx x4      Allergies as of 01/28/2016  . (No Known Allergies)    Vitals: BP 120/78 mmHg  Pulse 76  Resp 20  Ht 5\' 5"  (1.651 m)  Wt 220 lb (99.791 kg)  BMI 36.61 kg/m2 Last Weight:  Wt Readings from Last 1 Encounters:  01/28/16 220 lb (99.791 kg)   [id number] mass index is 36.61 kg/(m^2).     Last Height:   Ht Readings from Last 1 Encounters:  01/28/16 5\' 5"  (1.651 m)    Physical exam:  General: The patient is awake, alert and appears not in acute distress. The patient is well groomed. Head: Normocephalic, atraumatic. Neck is supple. Mallampati 3 uvula ot lifted above tongue ground. ,  neck circumference 16. Nasal airflow  unrestricted , TMJ - Click  evident . Retrognathia not seen.  Cardiovascular:  Regular rate and rhythm without  murmurs or carotid bruit, and without distended neck veins. Respiratory: Lungs are clear to auscultation. Skin:  Without evidence of edema, or rash Trunk: BMI is elevated . The patient's posture is erect.   Neurologic exam : The patient is  awake and alert, oriented to place and time.   Memory subjective described as intact.  Attention span & concentration ability appears normal.  Speech is fluent,  Without dysarthria, dysphonia or aphasia.  Mood and affect are appropriate.  Cranial nerves:  smell and taste intact. Pupils are equal and briskly reactive to light. Funduscopic exam without evidence of pallor or edema. Extraocular movements  in vertical and horizontal planes intact and without nystagmus. Visual fields by finger perimetry are intact. Hearing to finger rub intact. Tinnitus, Facial sensation intact to fine touch.Facial motor strength is symmetric and tongue and uvula move midline. Shoulder shrug was symmetrical.   Motor exam:   Normal tone, muscle bulk and symmetric strength in all extremities. Sensory:  Fine touch, pinprick and vibration were tested in all extremities.  Proprioception tested in the upper extremities was normal. Coordination:  Rapid alternating movements in the fingers/hands /Finger-to-nose maneuver are normal without evidence of ataxia, dysmetria or tremor. Gait and station: Patient walks without assistive device and is able unassisted to climb up to the exam table. Strength within normal limits. Stance is stable and normal.  Deep tendon reflexes: in the  upper and lower extremities are symmetric and intact. Babinski maneuver response is downgoing.  The patient was advised of the nature of the diagnosed sleep disorder , the treatment options and risks for general a health and wellness arising from not treating the condition.  I spent more than 45 minutes of face to face time with the patient. Greater than 50% of time was spent in counseling and coordination of care. We have discussed the diagnosis and differential and I answered the patient's questions.     Assessment:  After physical and neurologic examination, review of laboratory studies,  Personal review of imaging studies, reports of other /same   Imaging studies ,  Results of polysomnography/ neurophysiology testing and pre-existing records as far as provided in visit., my assessment is   1) the patient has a history of migraine headaches associated with photophobia and nausea but without a lateralization.  She also has an additional cluster component with a sharp intraocular stabbing pain. Her sleep study did not reveal any presence of apnea or hypoxia. Cluster headaches can often be treated with oxygen. It will be difficult in her case to justify this treatment. However her headaches have actually progressed to almost daily occurrence and would be considered intractable with status as they sometimes last more than 2 days. I will therefore refer her for a Botox treatment with Dr. Jaynee Eagles to Dr. Krista Blue.  2) She can now wean off pristiq, by taking the medication every other day for 10 days, then she should be safe to go off she may still have some set dysesthesias but they will pass.    Plan:  Treatment plan and additional workup :  1) referral for Botox treatment  2) I've asked the patient to keep a headache and sleep diary. This will also help [place] to see if Botox brings the desired effect.  3) Obesity- weight reduction is important for vascular headache patients. Low carb and moderate exercise will help to achieve a lower BMI.  4) Vivia Budge was not effective.    RV prn.   Ariel Partridge Angelize Ryce MD  01/28/2016   CC: Rubbie Battiest, [id number] [place], [place] [address]

## 2016-01-29 ENCOUNTER — Ambulatory Visit: Payer: 59 | Admitting: Neurology

## 2016-02-01 ENCOUNTER — Ambulatory Visit: Payer: 59 | Admitting: Nurse Practitioner

## 2016-02-01 ENCOUNTER — Other Ambulatory Visit: Payer: Self-pay | Admitting: Nurse Practitioner

## 2016-02-01 MED ORDER — HYDROCHLOROTHIAZIDE 12.5 MG PO TABS: 12.5000 mg | ORAL_TABLET | Freq: Every day | ORAL | Status: DC

## 2016-02-19 ENCOUNTER — Telehealth: Payer: Self-pay | Admitting: Neurology

## 2016-02-19 NOTE — Telephone Encounter (Signed)
Pt called again for [facility]

## 2016-02-19 NOTE — Telephone Encounter (Signed)
Pt called reg referral from Dr Brett Fairy to Dr Krista Blue for botox. Please call. Thanks!

## 2016-02-20 ENCOUNTER — Encounter: Payer: Self-pay | Admitting: Neurology

## 2016-02-20 NOTE — Telephone Encounter (Signed)
Message For: OFC                  Taken 30-MAY-17 at  2:50PM by ATJ ------------------------------------------------------------  Caller  Ariel Perez             CID  [id number]   Patient  Ariel Perez                  Pt's Dr  Brett Fairy      Area Code  [id number] *  DOB  [phone]      RE  NEEDS AN UPDATE ON A REFERRAL                                                                           Disp:Y/N  N  If Y = C/B If No Response In 24minutes  ============================================================

## 2016-02-21 ENCOUNTER — Encounter: Payer: Self-pay | Admitting: Neurology

## 2016-02-21 ENCOUNTER — Ambulatory Visit (INDEPENDENT_AMBULATORY_CARE_PROVIDER_SITE_OTHER): Payer: 59 | Admitting: Neurology

## 2016-02-21 VITALS — BP 120/86 | Resp 14 | Ht 65.0 in | Wt 226.0 lb

## 2016-02-21 DIAGNOSIS — G43009 Migraine without aura, not intractable, without status migrainosus: Secondary | ICD-10-CM

## 2016-02-21 MED ORDER — NORTRIPTYLINE HCL 10 MG PO CAPS: 30.0000 mg | ORAL_CAPSULE | Freq: Every day | ORAL | Status: DC

## 2016-02-21 NOTE — Telephone Encounter (Signed)
Spoke with patient and scheduled a botox consultation for her with Dr. Krista Blue today.

## 2016-02-21 NOTE — Progress Notes (Signed)
PATIENT: Ariel Perez DOB: 09-02-1981  Chief Complaint  Patient presents with  . Migraines    Ariel Perez is here today to discuss Botox inj. to treat migraine h/a's./fim     HISTORICAL  Ariel Perez is a 35 year old right-handed female, seen in refer by Dr. Brett Fairy for evaluation of Botox injection as migraine prevention  She had a history of migraine headaches since adolescence around age 88, her typical migraine are bilateral frontal retro-orbital area stabbing pounding headache with associated light noise sensitivity, sensitive to smell, nauseous, her migraine headache can last 7-10 days, trigger for her migraines are stress, perfumes, sleep deprivation, stress, weather change, exertion, being hungry, certain food, such as banana, her headache relieved by sleeping the quiet room,  Previously she has tried different preventive medications, Topamax 50 mg twice a day for about 6 months in 2010, without helping her headaches, gabapentin in 2011, atenolol since 2008, Lamictal in 2012, Pristiq, Effexor in 2016 failed to help her headaches,  For abortive treatment, she has tried and failed over-the-counter ibuprofen, Tylenol, naproxen, etc. migraine, Imitrex injection was helpful, but caused drowsiness, Relpax is less effective, she has never tried Maxalt, Zomig, Frova, or Amerge,  Since 2016, she had increased frequency of headache, 2-3 times each week, each last for a few days, in a month, she has more than 20 headaches days  REVIEW OF SYSTEMS: Full 14 system review of systems performed and notable only for as above  ALLERGIES: No Known Allergies  HOME MEDICATIONS: Current Outpatient Prescriptions  Medication Sig Dispense Refill  . cyclobenzaprine (FLEXERIL) 10 MG tablet Take 1 tablet (10 mg total) by mouth at bedtime. 30 tablet 0  . doxycycline (PERIOSTAT) 20 MG tablet   0  . hydrochlorothiazide (HYDRODIURIL) 12.5 MG tablet Take 1 tablet (12.5 mg total) by mouth daily. 30  tablet 1  . SUMAtriptan (IMITREX) 6 MG/0.5ML SOLN injection Inject 0.5 mLs (6 mg total) into the skin every 2 (two) hours as needed for migraine or headache (not to exceed 2 doses in 24 hours). May repeat in 2 hours if headache persists or recurs. 9 vial 1  . SUMAtriptan Succinate (ONZETRA XSAIL) 11 MG/NOSEPC EXHP Place 1 Dose into both nostrils once. Administer 2 nosepieces (1 per nostril) at onset of migraine, may repeat dose after 2 hours. Do not exceed 2 doses in 24 hours. 1 each 11  . desvenlafaxine (PRISTIQ) 50 MG 24 hr tablet Take 1 tablet (50 mg total) by mouth daily. (Patient not taking: Reported on 02/21/2016) 30 tablet 3  . predniSONE (DELTASONE) 10 MG tablet Take 6 tablets by mouth on day 1 then decrease by 1 tablet each day until gone. (Patient not taking: Reported on 02/21/2016) 21 tablet 0   No current facility-administered medications for this visit.    PAST MEDICAL HISTORY: Past Medical History  Diagnosis Date  . Headache   . Hypertension   . Osteoarthritis   . Migraines     PAST SURGICAL HISTORY: Past Surgical History  Procedure Laterality Date  . Wisdom tooth extraction N/A 2008    Approx x4     FAMILY HISTORY: Family History  Problem Relation Age of Onset  . Cancer Father     unknown  . Diabetes Paternal Grandmother   . Hypertension Maternal Grandmother   . Rheum arthritis Maternal Grandmother   . Cancer Maternal Grandfather     SOCIAL HISTORY:  Social History   Social History  . Marital Status: Single    Spouse  Name: N/A  . Number of Children: 0  . Years of Education: N/A   Occupational History  . [employer]    Social History Main Topics  . Smoking status: Never Smoker   . Smokeless tobacco: Never Used  . Alcohol Use: 0.0 oz/week    0 Standard drinks or equivalent per week     Comment: occ  . Drug Use: No  . Sexual Activity:    Partners: Male     Comment: 1 partner   Other Topics Concern  . Not on file   Social History  Narrative   Works here at [employer] by herself    2 dogs live inside    Caffeine- Occasional    Right handed    Enjoys sleeping      PHYSICAL EXAM   Filed Vitals:   02/21/16 1631  BP: 120/86  Resp: 14  Height: 5\' 5"  (1.651 m)  Weight: 226 lb (102.513 kg)    Not recorded      Body mass index is 37.61 kg/(m^2).  PHYSICAL EXAMNIATION:  Gen: NAD, conversant, well nourised, obese, well groomed                     Cardiovascular: Regular rate rhythm, no peripheral edema, warm, nontender. Eyes: Conjunctivae clear without exudates or hemorrhage Neck: Supple, no carotid bruise. Pulmonary: Clear to auscultation bilaterally   NEUROLOGICAL EXAM:  MENTAL STATUS: Speech:    Speech is normal; fluent and spontaneous with normal comprehension.  Cognition:     Orientation to time, place and person     Normal recent and remote memory     Normal Attention span and concentration     Normal Language, naming, repeating,spontaneous speech     Fund of knowledge   CRANIAL NERVES: CN II: Visual fields are full to confrontation. Fundoscopic exam is normal with sharp discs and no vascular changes. Pupils are round equal and briskly reactive to light. CN III, IV, VI: extraocular movement are normal. No ptosis. CN V: Facial sensation is intact to pinprick in all 3 divisions bilaterally. Corneal responses are intact.  CN VII: Face is symmetric with normal eye closure and smile. CN VIII: Hearing is normal to rubbing fingers CN IX, X: Palate elevates symmetrically. Phonation is normal. CN XI: Head turning and shoulder shrug are intact CN XII: Tongue is midline with normal movements and no atrophy.  MOTOR: There is no pronator drift of out-stretched arms. Muscle bulk and tone are normal. Muscle strength is normal.  REFLEXES: Reflexes are 2+ and symmetric at the biceps, triceps, knees, and ankles. Plantar responses are flexor.  SENSORY: Intact to light touch, pinprick, positional  sensation and vibratory sensation are intact in fingers and toes.  COORDINATION: Rapid alternating movements and fine finger movements are intact. There is no dysmetria on finger-to-nose and heel-knee-shin.    GAIT/STANCE: Posture is normal. Gait is steady with normal steps, base, arm swing, and turning. Heel and toe walking are normal. Tandem gait is normal.  Romberg is absent.   DIAGNOSTIC DATA (LABS, IMAGING, TESTING) - I reviewed patient records, labs, notes, testing and imaging myself where available.   ASSESSMENT AND PLAN  JAIDALYN HENDLER is a 35 y.o. female   Chronic migraine headaches  Tried and failed multiple preventative medications in the past, includes Topamax, beta blocker, Effexor, Lamictal, Pristiq, gabapentin  Currently taking Imitrex injection as needed for abortive treatment,  She would be a good candidate for Botox  injection as migraine prevention  Start preauthorization paperwork today, will return to clinic injection in 2 3 weeks   Marcial Pacas, M.D. Ph.D.  [facility] [address] [place], [place] [address] Ph: [phone] Fax: [phone]  CC: Referring Provider

## 2016-03-19 ENCOUNTER — Encounter: Payer: Self-pay | Admitting: Neurology

## 2016-03-24 ENCOUNTER — Telehealth: Payer: Self-pay | Admitting: Neurology

## 2016-03-24 NOTE — Telephone Encounter (Signed)
Patient is calling to see if her Botox has been approved for her 03/26/16 appointment  Please call.

## 2016-03-26 ENCOUNTER — Telehealth: Payer: Self-pay | Admitting: Neurology

## 2016-03-26 ENCOUNTER — Ambulatory Visit (INDEPENDENT_AMBULATORY_CARE_PROVIDER_SITE_OTHER): Payer: 59 | Admitting: Neurology

## 2016-03-26 VITALS — BP 138/86 | HR 96

## 2016-03-26 DIAGNOSIS — G43009 Migraine without aura, not intractable, without status migrainosus: Secondary | ICD-10-CM

## 2016-03-26 MED ORDER — BUPIVACAINE HCL (PF) 0.5 % IJ SOLN
10.0000 mL | Freq: Once | INTRAMUSCULAR | Status: AC
  Administered 2016-03-26: 10 mL

## 2016-03-26 NOTE — Telephone Encounter (Signed)
Please give her of Botox appointment as migraine prevention, before April 09 2016.  Quillian Quince, please check on the status of for Botox preauthorization.

## 2016-03-26 NOTE — Progress Notes (Signed)
   History:   Patient with frequent moderate to severe migraine since 2016, 3-4 headaches each week, came in today with 8 out of 10 pounding headache, with light noise sensitivity, nauseous, she has tried ibuprofen 600 mg this morning without helping  Bupivicaine injection protocol for headache  Bupivacaine 0.5% was injected on the scalp bilaterally at several locations:  -On the occipital area of the head, 3 injections each side, 1 cc per injection at the midpoint between the mastoid process and the occipital protuberance. 2 other injections were done one finger breadth from the initial injection, one at a 10 o'clock position and the other at a 2 o'clock position.  -2 injections of 0.5 cc were done in the temporal regions, 2 fingerbreadths above the tragus of the ear, with the second injection one fingerbreadth posteriorly to the first.  -2 injections were done on the brow, 1 in the medial brow and one over the supraorbital nerve notch, with 0.1 cc for each injection  -1 injection each side of 0.5 cc was done anterior to the tragus of the ear for a trigeminal ganglion injection  The patient tolerated the injections well, no complications of the procedure were noted. Injections were made with a 27-gauge needle.  [place] number is [phone].

## 2016-03-26 NOTE — Telephone Encounter (Signed)
Botox appt scheduled for 04/07/16.

## 2016-04-03 NOTE — Telephone Encounter (Signed)
Pt called in to check on botox approval . Please call to discuss

## 2016-04-04 NOTE — Telephone Encounter (Signed)
Spoke with the patient this morning. Her botox was approved but [facility] called her to relay that she would have a 600 dollar copay. She requested to cancel apt.

## 2016-04-07 ENCOUNTER — Ambulatory Visit: Payer: Self-pay | Admitting: Neurology

## 2016-04-14 NOTE — Telephone Encounter (Signed)
Pt returned Danielle's call °

## 2016-04-16 NOTE — Telephone Encounter (Signed)
Spoke with patient who stated she was going to call [facility] and ask if it would be cheaper to fill through them.

## 2016-04-21 ENCOUNTER — Encounter: Payer: Self-pay | Admitting: Neurology

## 2016-04-21 MED ORDER — VERAPAMIL HCL ER 120 MG PO TBCR
120.0000 mg | EXTENDED_RELEASE_TABLET | Freq: Every day | ORAL | 3 refills | Status: DC
Start: 2016-04-21 — End: 2017-05-08

## 2016-04-21 NOTE — Telephone Encounter (Signed)
You meant Verapamil, Keppra or Zomig not "and " , or ?   I will be happy to give the Verapamil. XR form , once at night po. CD  I send a FYI to Dr. Nicki Reaper and Dr Krista Blue

## 2016-04-22 NOTE — Telephone Encounter (Signed)
Reviewed my chart message.  Was sent as FYI by Dr Brett Fairy.

## 2016-04-22 NOTE — Telephone Encounter (Signed)
Ordered verapamil. CD

## 2016-04-25 NOTE — Telephone Encounter (Signed)
I ordered verapamil to replace the current medication. CD

## 2016-05-16 ENCOUNTER — Encounter: Payer: Self-pay | Admitting: Internal Medicine

## 2016-05-16 ENCOUNTER — Ambulatory Visit (INDEPENDENT_AMBULATORY_CARE_PROVIDER_SITE_OTHER): Payer: 59 | Admitting: Internal Medicine

## 2016-05-16 VITALS — BP 110/70 | HR 90 | Temp 98.3°F | Resp 18 | Ht 65.0 in | Wt 223.0 lb

## 2016-05-16 DIAGNOSIS — R8789 Other abnormal findings in specimens from female genital organs: Secondary | ICD-10-CM | POA: Diagnosis not present

## 2016-05-16 DIAGNOSIS — Z13220 Encounter for screening for lipoid disorders: Secondary | ICD-10-CM

## 2016-05-16 DIAGNOSIS — I10 Essential (primary) hypertension: Secondary | ICD-10-CM | POA: Diagnosis not present

## 2016-05-16 DIAGNOSIS — G43809 Other migraine, not intractable, without status migrainosus: Secondary | ICD-10-CM | POA: Diagnosis not present

## 2016-05-16 DIAGNOSIS — B977 Papillomavirus as the cause of diseases classified elsewhere: Secondary | ICD-10-CM

## 2016-05-16 DIAGNOSIS — IMO0002 Reserved for concepts with insufficient information to code with codable children: Secondary | ICD-10-CM

## 2016-05-16 MED ORDER — HYDROCHLOROTHIAZIDE 12.5 MG PO TABS: 12.5000 mg | ORAL_TABLET | Freq: Every day | ORAL | 2 refills | Status: DC

## 2016-05-16 NOTE — Progress Notes (Signed)
Patient ID: Ariel Perez, female   DOB: 11/24/1980, 35 y.o.   MRN: [id number]   Subjective:    Patient ID: Ariel Perez, female    DOB: [birth year], 35 y.o.   MRN: [id number]  HPI  Patient here to establish care.  She has a history of migraine headaches.  Her typical migraine headaches are frontal headaches and involve her temples.  triggered by perfumes, loud noise and certain foods.  Seeing neurology.  Has tried multiple medications in the past.  Recently received bupivicaine injections.  Did help.  Has imitrex and uses injections intermittently.  She is also on verapamil now.  Started two weeks ago.  Previous CT negative.  Tries to stay active.  No cardiac symptoms with increased activity or exertion.  No sob.  No acid reflux.  No abdominal pain or cramping.  Bowels stable.  Previously on atenolol.  Did not tolerate.  On hctz now.     Past Medical History:  Diagnosis Date  . Headache   . Hypertension   . Migraines   . Osteoarthritis    Past Surgical History:  Procedure Laterality Date  . WISDOM TOOTH EXTRACTION N/A 2008   Approx x4    Family History  Problem Relation Age of Onset  . Cancer Father     unknown  . Diabetes Paternal Grandmother   . Hypertension Maternal Grandmother   . Rheum arthritis Maternal Grandmother   . Cancer Maternal Grandfather    Social History   Social History  . Marital status: Single    Spouse name: N/A  . Number of children: 0  . Years of education: N/A   Occupational History  . [employer]    Social History Main Topics  . Smoking status: Never Smoker  . Smokeless tobacco: Never Used  . Alcohol use 0.0 oz/week     Comment: occ  . Drug use: No  . Sexual activity: Yes    Partners: Male     Comment: 1 partner   Other Topics Concern  . None   Social History Narrative   Works here at [employer] by herself    2 dogs live inside    Caffeine- Occasional    Right handed    Enjoys sleeping     Outpatient  Encounter Prescriptions as of 05/16/2016  Medication Sig  . hydrochlorothiazide (HYDRODIURIL) 12.5 MG tablet Take 1 tablet (12.5 mg total) by mouth daily.  . SUMAtriptan (IMITREX) 6 MG/0.5ML SOLN injection Inject 0.5 mLs (6 mg total) into the skin every 2 (two) hours as needed for migraine or headache (not to exceed 2 doses in 24 hours). May repeat in 2 hours if headache persists or recurs.  . SUMAtriptan Succinate (ONZETRA XSAIL) 11 MG/NOSEPC EXHP Place 1 Dose into both nostrils once. Administer 2 nosepieces (1 per nostril) at onset of migraine, may repeat dose after 2 hours. Do not exceed 2 doses in 24 hours.  . verapamil (CALAN-SR) 120 MG CR tablet Take 1 tablet (120 mg total) by mouth at bedtime.  . [DISCONTINUED] hydrochlorothiazide (HYDRODIURIL) 12.5 MG tablet Take 1 tablet (12.5 mg total) by mouth daily.  . [DISCONTINUED] desvenlafaxine (PRISTIQ) 50 MG 24 hr tablet Take 1 tablet (50 mg total) by mouth daily. (Patient not taking: Reported on 02/21/2016)  . [DISCONTINUED] doxycycline (PERIOSTAT) 20 MG tablet   . [DISCONTINUED] nortriptyline (PAMELOR) 10 MG capsule Take 3 capsules (30 mg total) by mouth at bedtime. (Patient not taking: Reported on 05/16/2016)  No facility-administered encounter medications on file as of 05/16/2016.     Review of Systems  Constitutional: Negative for appetite change and unexpected weight change.  HENT: Negative for congestion and sinus pressure.   Respiratory: Negative for cough, chest tightness and shortness of breath.   Cardiovascular: Negative for chest pain, palpitations and leg swelling.  Gastrointestinal: Negative for abdominal pain, diarrhea, nausea and vomiting.  Genitourinary: Negative for difficulty urinating and dysuria.  Musculoskeletal: Negative for back pain and joint swelling.  Skin: Negative for color change and rash.  Neurological: Positive for headaches. Negative for dizziness and light-headedness.  Psychiatric/Behavioral: Negative for  agitation and dysphoric mood.       Objective:    Physical Exam  Constitutional: She appears well-developed and well-nourished. No distress.  HENT:  Nose: Nose normal.  Mouth/Throat: Oropharynx is clear and moist.  Neck: Neck supple. No thyromegaly present.  Cardiovascular: Normal rate and regular rhythm.   Pulmonary/Chest: Breath sounds normal. No respiratory distress. She has no wheezes.  Abdominal: Soft. Bowel sounds are normal. There is no tenderness.  Musculoskeletal: She exhibits no edema or tenderness.  Lymphadenopathy:    She has no cervical adenopathy.  Skin: No rash noted. No erythema.  Psychiatric: She has a normal mood and affect. Her behavior is normal.    BP 110/70   Pulse 90   Temp 98.3 F (36.8 C) (Oral)   Resp 18   Ht 5\' 5"  (1.651 m)   Wt 223 lb (101.2 kg)   SpO2 98%   BMI 37.11 kg/m  Wt Readings from Last 3 Encounters:  05/16/16 223 lb (101.2 kg)  02/21/16 226 lb (102.5 kg)  01/28/16 220 lb (99.8 kg)     Lab Results  Component Value Date   WBC 4.5 10/17/2015   HGB 13.0 10/17/2015   HCT 40.0 10/17/2015   PLT 228.0 10/17/2015   GLUCOSE 96 10/17/2015   CHOL 127 10/09/2011   TRIG 45 10/09/2011   HDL 48 10/09/2011   LDLCALC 70 10/09/2011   ALT 12 10/09/2011   AST 18 10/09/2011   NA 138 10/17/2015   K 4.1 10/17/2015   CL 104 10/17/2015   CREATININE 0.91 10/17/2015   BUN 11 10/17/2015   CO2 28 10/17/2015   TSH 0.59 10/09/2011   HGBA1C 5.6 10/17/2015       Assessment & Plan:   Problem List Items Addressed This Visit    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  States up to date.        Essential (primary) hypertension    Blood pressure under good control.  Continue same medication regimen.  Follow pressures.  Follow metabolic panel.        Relevant Medications   hydrochlorothiazide (HYDRODIURIL) 12.5 MG tablet   Other Relevant Orders   CBC with Differential/Platelet   Comprehensive metabolic  panel   TSH   Lipid panel   Headache, migraine    Has a history of headaches as outlined.  Seeing neurology.  Reviewed notes.  Just started verapamil.  Recently received bupivicaine injections.  Did help.  Continue f/u with neurology.        Relevant Medications   hydrochlorothiazide (HYDRODIURIL) 12.5 MG tablet    Other Visit Diagnoses    Screening cholesterol level    -  Primary   Relevant Orders   Hemoglobin A1c     I spent 25 minutes with the patient and more than 50% of the  time was spent in consultation regarding the above.     Einar Pheasant, MD

## 2016-05-16 NOTE — Progress Notes (Signed)
Pre-visit discussion using our clinic review tool. No additional management support is needed unless otherwise documented below in the visit note.  

## 2016-05-18 ENCOUNTER — Encounter: Payer: Self-pay | Admitting: Internal Medicine

## 2016-05-18 NOTE — Assessment & Plan Note (Signed)
Followed by gyn.  States up to date.

## 2016-05-18 NOTE — Assessment & Plan Note (Signed)
Blood pressure under good control.  Continue same medication regimen.  Follow pressures.  Follow metabolic panel.   

## 2016-05-18 NOTE — Assessment & Plan Note (Signed)
Has a history of headaches as outlined.  Seeing neurology.  Reviewed notes.  Just started verapamil.  Recently received bupivicaine injections.  Did help.  Continue f/u with neurology.

## 2016-05-19 ENCOUNTER — Other Ambulatory Visit: Payer: Self-pay | Admitting: Neurology

## 2016-05-19 NOTE — Telephone Encounter (Signed)
Rx refused.  Patient needs an appointment.

## 2016-06-18 ENCOUNTER — Other Ambulatory Visit: Payer: 59

## 2016-06-18 ENCOUNTER — Other Ambulatory Visit (INDEPENDENT_AMBULATORY_CARE_PROVIDER_SITE_OTHER): Payer: 59

## 2016-06-18 DIAGNOSIS — Z13220 Encounter for screening for lipoid disorders: Secondary | ICD-10-CM

## 2016-06-18 DIAGNOSIS — I10 Essential (primary) hypertension: Secondary | ICD-10-CM | POA: Diagnosis not present

## 2016-06-18 LAB — CBC WITH DIFFERENTIAL/PLATELET
BASOS PCT: 0.9 % (ref 0.0–3.0)
Basophils Absolute: 0 10*3/uL (ref 0.0–0.1)
EOS PCT: 4.1 % (ref 0.0–5.0)
Eosinophils Absolute: 0.2 10*3/uL (ref 0.0–0.7)
HCT: 39.7 % (ref 36.0–46.0)
Hemoglobin: 13.4 g/dL (ref 12.0–15.0)
LYMPHS ABS: 1.9 10*3/uL (ref 0.7–4.0)
Lymphocytes Relative: 34.1 % (ref 12.0–46.0)
MCHC: 33.7 g/dL (ref 30.0–36.0)
MCV: 88.9 fl (ref 78.0–100.0)
MONO ABS: 0.4 10*3/uL (ref 0.1–1.0)
Monocytes Relative: 6.4 % (ref 3.0–12.0)
NEUTROS ABS: 3.1 10*3/uL (ref 1.4–7.7)
NEUTROS PCT: 54.5 % (ref 43.0–77.0)
Platelets: 266 10*3/uL (ref 150.0–400.0)
RBC: 4.47 Mil/uL (ref 3.87–5.11)
RDW: 14.4 % (ref 11.5–15.5)
WBC: 5.7 10*3/uL (ref 4.0–10.5)

## 2016-06-18 LAB — COMPREHENSIVE METABOLIC PANEL
ALBUMIN: 4.1 g/dL (ref 3.5–5.2)
ALK PHOS: 62 U/L (ref 39–117)
ALT: 12 U/L (ref 0–35)
AST: 16 U/L (ref 0–37)
BILIRUBIN TOTAL: 0.5 mg/dL (ref 0.2–1.2)
BUN: 9 mg/dL (ref 6–23)
CO2: 30 mEq/L (ref 19–32)
Calcium: 9.5 mg/dL (ref 8.4–10.5)
Chloride: 102 mEq/L (ref 96–112)
Creatinine, Ser: 0.93 mg/dL (ref 0.40–1.20)
GFR: 87.2 mL/min (ref >60.00)
GLUCOSE: 96 mg/dL (ref 70–99)
Potassium: 4.1 mEq/L (ref 3.5–5.1)
Sodium: 139 mEq/L (ref 135–145)
TOTAL PROTEIN: 8 g/dL (ref 6.0–8.3)

## 2016-06-18 LAB — HEMOGLOBIN A1C: HEMOGLOBIN A1C: 5.6 % (ref 4.6–6.5)

## 2016-06-18 LAB — LIPID PANEL
CHOLESTEROL: 127 mg/dL (ref 0–200)
HDL: 45.1 mg/dL (ref >39.00)
LDL Cholesterol: 72 mg/dL (ref 0–99)
NonHDL: 82.17
Total CHOL/HDL Ratio: 3
Triglycerides: 50 mg/dL (ref 0.0–149.0)
VLDL: 10 mg/dL (ref 0.0–40.0)

## 2016-06-18 LAB — TSH: TSH: 1.15 u[IU]/mL (ref 0.35–4.50)

## 2016-06-19 ENCOUNTER — Encounter: Payer: Self-pay | Admitting: Internal Medicine

## 2016-06-19 ENCOUNTER — Other Ambulatory Visit: Payer: Self-pay | Admitting: Internal Medicine

## 2016-06-19 DIAGNOSIS — M2550 Pain in unspecified joint: Secondary | ICD-10-CM

## 2016-06-19 NOTE — Telephone Encounter (Signed)
My chart sent to pt regarding scheduling labs.

## 2016-06-23 ENCOUNTER — Other Ambulatory Visit (INDEPENDENT_AMBULATORY_CARE_PROVIDER_SITE_OTHER): Payer: 59

## 2016-06-23 DIAGNOSIS — M2550 Pain in unspecified joint: Secondary | ICD-10-CM

## 2016-06-24 LAB — IBC PANEL
IRON: 27 ug/dL — AB (ref 42–145)
Saturation Ratios: 7.4 % — ABNORMAL LOW (ref 20.0–50.0)
TRANSFERRIN: 262 mg/dL (ref 212.0–360.0)

## 2016-06-24 LAB — SEDIMENTATION RATE: Sed Rate: 52 mm/hr — ABNORMAL HIGH (ref 0–20)

## 2016-06-24 LAB — FERRITIN: FERRITIN: 50.2 ng/mL (ref 10.0–291.0)

## 2016-06-25 ENCOUNTER — Encounter: Payer: Self-pay | Admitting: Internal Medicine

## 2016-07-17 ENCOUNTER — Telehealth: Payer: Self-pay

## 2016-07-17 NOTE — Telephone Encounter (Signed)
Received FMLA paperwork for pt's migraines. Filled out, reviewed and signed by Dr. Brett Fairy. Sent to MR for processing.

## 2016-07-21 ENCOUNTER — Telehealth: Payer: Self-pay | Admitting: *Deleted

## 2016-07-21 NOTE — Telephone Encounter (Signed)
I called pt, I was unable to leave a message, pt mailbox is full. Pt form is ready the fee need to be paid.

## 2016-09-18 ENCOUNTER — Ambulatory Visit (INDEPENDENT_AMBULATORY_CARE_PROVIDER_SITE_OTHER): Payer: 59 | Admitting: Internal Medicine

## 2016-09-18 ENCOUNTER — Encounter: Payer: Self-pay | Admitting: Internal Medicine

## 2016-09-18 VITALS — BP 128/92 | HR 80 | Temp 98.3°F | Resp 12 | Ht 65.0 in | Wt 224.8 lb

## 2016-09-18 DIAGNOSIS — G43809 Other migraine, not intractable, without status migrainosus: Secondary | ICD-10-CM | POA: Diagnosis not present

## 2016-09-18 DIAGNOSIS — R739 Hyperglycemia, unspecified: Secondary | ICD-10-CM

## 2016-09-18 DIAGNOSIS — I10 Essential (primary) hypertension: Secondary | ICD-10-CM | POA: Diagnosis not present

## 2016-09-18 NOTE — Progress Notes (Signed)
Patient ID: Ariel Perez, female   DOB: 07-Mar-1981, 35 y.o.   MRN: [id number]   Subjective:    Patient ID: Ariel Perez, female    DOB: 1981-06-02, 35 y.o.   MRN: [id number]  HPI  Patient here for a scheduled follow up.  She reports she has been doing relatively well.  Still having problems with migraine headaches.  Has been seeing Dr Brett Fairy.  Off verapamil.  She plans to pursue f/u with neurology.  Trying to stay active.  Has not been watching her diet as closely.  Plans to start.  Discussed diet and exercise.  She is off hctz.  Has been off for two weeks.  Blood pressure a little elevated.  No chest pain.  Breathing stable.  Bowels stable.  [facility].    Past Medical History:  Diagnosis Date  . Headache   . Hypertension   . Migraines   . Osteoarthritis    Past Surgical History:  Procedure Laterality Date  . WISDOM TOOTH EXTRACTION N/A 2008   Approx x4    Family History  Problem Relation Age of Onset  . Cancer Father     unknown  . Diabetes Paternal Grandmother   . Hypertension Maternal Grandmother   . Rheum arthritis Maternal Grandmother   . Cancer Maternal Grandfather    Social History   Social History  . Marital status: Single    Spouse name: N/A  . Number of children: 0  . Years of education: N/A   Occupational History  . [employer]    Social History Main Topics  . Smoking status: Never Smoker  . Smokeless tobacco: Never Used  . Alcohol use 0.0 oz/week     Comment: occ  . Drug use: No  . Sexual activity: Yes    Partners: Male     Comment: 1 partner   Other Topics Concern  . None   Social History Narrative   Works here at [employer] by herself    2 dogs live inside    Caffeine- Occasional    Right handed    Enjoys sleeping     Outpatient Encounter Prescriptions as of 09/18/2016  Medication Sig  . SUMAtriptan (IMITREX) 6 MG/0.5ML SOLN injection Inject 0.5 mLs (6 mg total) into the  skin every 2 (two) hours as needed for migraine or headache (not to exceed 2 doses in 24 hours). May repeat in 2 hours if headache persists or recurs.  . SUMAtriptan Succinate (ONZETRA XSAIL) 11 MG/NOSEPC EXHP Place 1 Dose into both nostrils once. Administer 2 nosepieces (1 per nostril) at onset of migraine, may repeat dose after 2 hours. Do not exceed 2 doses in 24 hours.  . hydrochlorothiazide (HYDRODIURIL) 12.5 MG tablet Take 1 tablet (12.5 mg total) by mouth daily. (Patient not taking: Reported on 09/18/2016)  . verapamil (CALAN-SR) 120 MG CR tablet Take 1 tablet (120 mg total) by mouth at bedtime. (Patient not taking: Reported on 09/18/2016)   No facility-administered encounter medications on file as of 09/18/2016.     Review of Systems  Constitutional: Negative for appetite change and unexpected weight change.  HENT: Negative for congestion and sinus pressure.   Respiratory: Negative for cough, chest tightness and shortness of breath.   Cardiovascular: Negative for chest pain, palpitations and leg swelling.  Gastrointestinal: Negative for abdominal pain, diarrhea, nausea and vomiting.  Genitourinary: Negative for difficulty urinating and dysuria.  Musculoskeletal: Negative for joint swelling and  myalgias.  Skin: Negative for color change and rash.  Neurological: Positive for headaches. Negative for dizziness and light-headedness.  Psychiatric/Behavioral: Negative for agitation and dysphoric mood.       Objective:     Blood pressure rechecked by me:  132/90-92  Physical Exam  Constitutional: She appears well-developed and well-nourished. No distress.  HENT:  Nose: Nose normal.  Mouth/Throat: Oropharynx is clear and moist.  Neck: Neck supple. No thyromegaly present.  Cardiovascular: Normal rate and regular rhythm.   Pulmonary/Chest: Breath sounds normal. No respiratory distress. She has no wheezes.  Abdominal: Soft. Bowel sounds are normal. There is no tenderness.    Musculoskeletal: She exhibits no edema or tenderness.  Lymphadenopathy:    She has no cervical adenopathy.  Skin: No rash noted. No erythema.  Psychiatric: She has a normal mood and affect. Her behavior is normal.    BP (!) 128/92   Pulse 80   Temp 98.3 F (36.8 C) (Oral)   Resp 12   Ht 5\' 5"  (1.651 m)   Wt 224 lb 12 oz (101.9 kg)   SpO2 99%   BMI 37.40 kg/m  Wt Readings from Last 3 Encounters:  09/18/16 224 lb 12 oz (101.9 kg)  05/16/16 223 lb (101.2 kg)  02/21/16 226 lb (102.5 kg)     Lab Results  Component Value Date   WBC 5.7 06/18/2016   HGB 13.4 06/18/2016   HCT 39.7 06/18/2016   PLT 266.0 06/18/2016   GLUCOSE 96 06/18/2016   CHOL 127 06/18/2016   TRIG 50.0 06/18/2016   HDL 45.10 06/18/2016   LDLCALC 72 06/18/2016   ALT 12 06/18/2016   AST 16 06/18/2016   NA 139 06/18/2016   K 4.1 06/18/2016   CL 102 06/18/2016   CREATININE 0.93 06/18/2016   BUN 9 06/18/2016   CO2 30 06/18/2016   TSH 1.15 06/18/2016   HGBA1C 5.6 06/18/2016       Assessment & Plan:   Problem List Items Addressed This Visit    Essential (primary) hypertension    Blood pressure elevated today.  Off hctz.  Will restart.  Have her spot check her pressure.  Send in readings over the next several weeks.  Follow metabolic panel.        Relevant Orders   Lipid panel   Comprehensive metabolic panel   Headache, migraine    Has been followed by neurology.  Still with headaches.  Plans to f/u with neurology to discuss other treatment options.         Other Visit Diagnoses    Hyperglycemia    -  Primary   Relevant Orders   Hemoglobin A1c       Einar Pheasant, MD

## 2016-09-18 NOTE — Progress Notes (Signed)
Pre-visit discussion using our clinic review tool. No additional management support is needed unless otherwise documented below in the visit note.  

## 2016-09-19 ENCOUNTER — Encounter: Payer: Self-pay | Admitting: Internal Medicine

## 2016-09-19 NOTE — Assessment & Plan Note (Signed)
Blood pressure elevated today.  Off hctz.  Will restart.  Have her spot check her pressure.  Send in readings over the next several weeks.  Follow metabolic panel.

## 2016-09-19 NOTE — Assessment & Plan Note (Signed)
Has been followed by neurology.  Still with headaches.  Plans to f/u with neurology to discuss other treatment options.

## 2016-10-01 ENCOUNTER — Encounter: Payer: Self-pay | Admitting: Internal Medicine

## 2016-10-01 DIAGNOSIS — G43809 Other migraine, not intractable, without status migrainosus: Secondary | ICD-10-CM

## 2016-10-01 NOTE — Telephone Encounter (Signed)
Order placed for referral.  Information - phone numbers in message.

## 2016-10-16 DIAGNOSIS — G43709 Chronic migraine without aura, not intractable, without status migrainosus: Secondary | ICD-10-CM | POA: Diagnosis not present

## 2016-11-27 DIAGNOSIS — G43709 Chronic migraine without aura, not intractable, without status migrainosus: Secondary | ICD-10-CM | POA: Diagnosis not present

## 2016-12-16 ENCOUNTER — Other Ambulatory Visit: Payer: Self-pay | Admitting: Internal Medicine

## 2017-01-12 ENCOUNTER — Encounter: Payer: Self-pay | Admitting: Internal Medicine

## 2017-01-12 ENCOUNTER — Other Ambulatory Visit (INDEPENDENT_AMBULATORY_CARE_PROVIDER_SITE_OTHER): Payer: 59

## 2017-01-12 DIAGNOSIS — I10 Essential (primary) hypertension: Secondary | ICD-10-CM | POA: Diagnosis not present

## 2017-01-12 DIAGNOSIS — R739 Hyperglycemia, unspecified: Secondary | ICD-10-CM

## 2017-01-12 LAB — COMPREHENSIVE METABOLIC PANEL
ALT: 10 U/L (ref 0–35)
AST: 15 U/L (ref 0–37)
Albumin: 4.1 g/dL (ref 3.5–5.2)
Alkaline Phosphatase: 58 U/L (ref 39–117)
BILIRUBIN TOTAL: 0.7 mg/dL (ref 0.2–1.2)
BUN: 11 mg/dL (ref 6–23)
CO2: 27 meq/L (ref 19–32)
Calcium: 9.5 mg/dL (ref 8.4–10.5)
Chloride: 104 mEq/L (ref 96–112)
Creatinine, Ser: 0.98 mg/dL (ref 0.40–1.20)
GFR: 81.83 mL/min (ref >60.00)
GLUCOSE: 102 mg/dL — AB (ref 70–99)
Potassium: 4.1 mEq/L (ref 3.5–5.1)
SODIUM: 137 meq/L (ref 135–145)
Total Protein: 7.7 g/dL (ref 6.0–8.3)

## 2017-01-12 LAB — LIPID PANEL
Cholesterol: 123 mg/dL (ref 0–200)
HDL: 42.7 mg/dL (ref >39.00)
LDL Cholesterol: 70 mg/dL (ref 0–99)
NONHDL: 80.43
Total CHOL/HDL Ratio: 3
Triglycerides: 53 mg/dL (ref 0.0–149.0)
VLDL: 10.6 mg/dL (ref 0.0–40.0)

## 2017-01-12 LAB — HEMOGLOBIN A1C: Hgb A1c MFr Bld: 5.9 % (ref 4.6–6.5)

## 2017-01-19 ENCOUNTER — Ambulatory Visit: Payer: 59 | Admitting: Internal Medicine

## 2017-02-12 ENCOUNTER — Ambulatory Visit: Payer: 59 | Admitting: Internal Medicine

## 2017-03-31 ENCOUNTER — Encounter: Payer: Self-pay | Admitting: Internal Medicine

## 2017-04-01 MED ORDER — HYDROCHLOROTHIAZIDE 12.5 MG PO CAPS: 12.5000 mg | ORAL_CAPSULE | Freq: Every day | ORAL | 2 refills | Status: DC

## 2017-04-03 DIAGNOSIS — Z01419 Encounter for gynecological examination (general) (routine) without abnormal findings: Secondary | ICD-10-CM | POA: Diagnosis not present

## 2017-04-03 DIAGNOSIS — Z6835 Body mass index (BMI) 35.0-35.9, adult: Secondary | ICD-10-CM | POA: Diagnosis not present

## 2017-04-03 LAB — HM PAP SMEAR: HM PAP: NEGATIVE

## 2017-05-08 ENCOUNTER — Ambulatory Visit (INDEPENDENT_AMBULATORY_CARE_PROVIDER_SITE_OTHER): Payer: 59 | Admitting: Internal Medicine

## 2017-05-08 ENCOUNTER — Encounter: Payer: Self-pay | Admitting: Internal Medicine

## 2017-05-08 DIAGNOSIS — Z6837 Body mass index (BMI) 37.0-37.9, adult: Secondary | ICD-10-CM | POA: Diagnosis not present

## 2017-05-08 DIAGNOSIS — G43809 Other migraine, not intractable, without status migrainosus: Secondary | ICD-10-CM

## 2017-05-08 DIAGNOSIS — I10 Essential (primary) hypertension: Secondary | ICD-10-CM

## 2017-05-08 NOTE — Progress Notes (Signed)
Patient ID: Ariel Perez, female   DOB: 1980-11-24, 36 y.o.   MRN: [id number]   Subjective:    Patient ID: Ariel Perez, female    DOB: [birth year], 36 y.o.   MRN: [id number]  HPI  Patient here for a scheduled follow up.  She reports she is doing well.  Headaches doing well on keppra.  Has been seeing neurology.  Note reviewed.  Has keppra.  Does not need often.  Overall feels better.  No chest pain.  No sob.  No acid reflux.  No abdominal pain.  Bowels moving.  Overall feels good.  Job going well.  Followed by gyn - [facility]. Obtain records.  States up to date.     Past Medical History:  Diagnosis Date  . Headache   . Hypertension   . Migraines   . Osteoarthritis    Past Surgical History:  Procedure Laterality Date  . WISDOM TOOTH EXTRACTION N/A 2008   Approx x4    Family History  Problem Relation Age of Onset  . Cancer Father        unknown  . Diabetes Paternal Grandmother   . Hypertension Maternal Grandmother   . Rheum arthritis Maternal Grandmother   . Cancer Maternal Grandfather    Social History   Social History  . Marital status: Single    Spouse name: N/A  . Number of children: 0  . Years of education: N/A   Occupational History  . [employer]    Social History Main Topics  . Smoking status: Never Smoker  . Smokeless tobacco: Never Used  . Alcohol use 0.0 oz/week     Comment: occ  . Drug use: No  . Sexual activity: Yes    Partners: Male     Comment: 1 partner   Other Topics Concern  . None   Social History Narrative   Works here at [employer] by herself    2 dogs live inside    Caffeine- Occasional    Right handed    Enjoys sleeping     Outpatient Encounter Prescriptions as of 05/08/2017  Medication Sig  . almotriptan (AXERT) 12.5 MG tablet Take 12.5 mg by mouth 2 (two) times daily.  . hydrochlorothiazide (MICROZIDE) 12.5 MG capsule Take 1 capsule (12.5 mg total) by mouth daily.  . SUMAtriptan (IMITREX) 6  MG/0.5ML SOLN injection Inject 0.5 mLs (6 mg total) into the skin every 2 (two) hours as needed for migraine or headache (not to exceed 2 doses in 24 hours). May repeat in 2 hours if headache persists or recurs.  . SUMAtriptan Succinate (ONZETRA XSAIL) 11 MG/NOSEPC EXHP Place 1 Dose into both nostrils once. Administer 2 nosepieces (1 per nostril) at onset of migraine, may repeat dose after 2 hours. Do not exceed 2 doses in 24 hours.  . levETIRAcetam (KEPPRA) 500 MG tablet Take 500 mg by mouth 2 (two) times daily.  . [DISCONTINUED] verapamil (CALAN-SR) 120 MG CR tablet Take 1 tablet (120 mg total) by mouth at bedtime.   No facility-administered encounter medications on file as of 05/08/2017.     Review of Systems  Constitutional: Negative for appetite change and unexpected weight change.  HENT: Negative for congestion and sinus pressure.   Respiratory: Negative for cough, chest tightness and shortness of breath.   Cardiovascular: Negative for chest pain, palpitations and leg swelling.  Gastrointestinal: Negative for abdominal pain, diarrhea, nausea and vomiting.  Genitourinary: Negative for difficulty urinating and dysuria.  Musculoskeletal: Negative for back pain and joint swelling.  Skin: Negative for color change and rash.  Neurological: Negative for dizziness and light-headedness.       Headaches better.    Psychiatric/Behavioral: Negative for agitation and dysphoric mood.       Objective:    Physical Exam  Constitutional: She appears well-developed and well-nourished. No distress.  HENT:  Nose: Nose normal.  Mouth/Throat: Oropharynx is clear and moist.  Neck: Neck supple. No thyromegaly present.  Cardiovascular: Normal rate and regular rhythm.   Pulmonary/Chest: Breath sounds normal. No respiratory distress. She has no wheezes.  Abdominal: Soft. Bowel sounds are normal. There is no tenderness.  Musculoskeletal: She exhibits no edema or tenderness.  Lymphadenopathy:    She has  no cervical adenopathy.  Skin: No rash noted. No erythema.  Psychiatric: She has a normal mood and affect. Her behavior is normal.    BP 124/88 (BP Location: Left Arm, Patient Position: Sitting, Cuff Size: Large)   Pulse 98   Temp 98.6 F (37 C) (Oral)   Resp 12   Ht 5\' 5"  (1.651 m)   Wt 223 lb 6.4 oz (101.3 kg)   SpO2 98%   BMI 37.18 kg/m  Wt Readings from Last 3 Encounters:  05/08/17 223 lb 6.4 oz (101.3 kg)  09/18/16 224 lb 12 oz (101.9 kg)  05/16/16 223 lb (101.2 kg)     Lab Results  Component Value Date   WBC 5.7 06/18/2016   HGB 13.4 06/18/2016   HCT 39.7 06/18/2016   PLT 266.0 06/18/2016   GLUCOSE 102 (H) 01/12/2017   CHOL 123 01/12/2017   TRIG 53.0 01/12/2017   HDL 42.70 01/12/2017   LDLCALC 70 01/12/2017   ALT 10 01/12/2017   AST 15 01/12/2017   NA 137 01/12/2017   K 4.1 01/12/2017   CL 104 01/12/2017   CREATININE 0.98 01/12/2017   BUN 11 01/12/2017   CO2 27 01/12/2017   TSH 1.15 06/18/2016   HGBA1C 5.9 01/12/2017       Assessment & Plan:   Problem List Items Addressed This Visit    BMI 37.0-37.9, adult    Diet and exercise.  Follow.       Essential (primary) hypertension    Blood pressure as outlined.  Have her spot check her pressure. Send in readings. Hold on making changes in her medication.  Follow.  Follow metabolic panel.        Headache, migraine    Has been followed by neurology.  Doing well on keppra.  Rarely needs axert.  Follow.  Feels better.        Relevant Medications   levETIRAcetam (KEPPRA) 500 MG tablet   almotriptan (AXERT) 12.5 MG tablet       Einar Pheasant, MD

## 2017-05-08 NOTE — Progress Notes (Signed)
Pre-visit discussion using our clinic review tool. No additional management support is needed unless otherwise documented below in the visit note.  

## 2017-05-10 ENCOUNTER — Encounter: Payer: Self-pay | Admitting: Internal Medicine

## 2017-05-10 NOTE — Assessment & Plan Note (Signed)
Has been followed by neurology.  Doing well on keppra.  Rarely needs axert.  Follow.  Feels better.

## 2017-05-10 NOTE — Assessment & Plan Note (Signed)
Diet and exercise.  Follow.  

## 2017-05-10 NOTE — Assessment & Plan Note (Signed)
Blood pressure as outlined.  Have her spot check her pressure. Send in readings. Hold on making changes in her medication.  Follow.  Follow metabolic panel.

## 2017-06-07 ENCOUNTER — Encounter: Payer: Self-pay | Admitting: Internal Medicine

## 2017-06-17 ENCOUNTER — Encounter: Payer: Self-pay | Admitting: Internal Medicine

## 2017-06-17 DIAGNOSIS — Z13220 Encounter for screening for lipoid disorders: Secondary | ICD-10-CM

## 2017-06-17 DIAGNOSIS — E611 Iron deficiency: Secondary | ICD-10-CM

## 2017-06-17 DIAGNOSIS — R739 Hyperglycemia, unspecified: Secondary | ICD-10-CM

## 2017-06-17 DIAGNOSIS — I10 Essential (primary) hypertension: Secondary | ICD-10-CM

## 2017-06-18 NOTE — Telephone Encounter (Signed)
Orders placed for labs

## 2017-06-19 NOTE — Telephone Encounter (Signed)
Hard copy mailed  

## 2017-06-25 ENCOUNTER — Other Ambulatory Visit (INDEPENDENT_AMBULATORY_CARE_PROVIDER_SITE_OTHER): Payer: 59

## 2017-06-25 DIAGNOSIS — R739 Hyperglycemia, unspecified: Secondary | ICD-10-CM | POA: Diagnosis not present

## 2017-06-25 DIAGNOSIS — I10 Essential (primary) hypertension: Secondary | ICD-10-CM | POA: Diagnosis not present

## 2017-06-25 DIAGNOSIS — E611 Iron deficiency: Secondary | ICD-10-CM

## 2017-06-25 DIAGNOSIS — Z13220 Encounter for screening for lipoid disorders: Secondary | ICD-10-CM | POA: Diagnosis not present

## 2017-06-25 LAB — CBC WITH DIFFERENTIAL/PLATELET
BASOS ABS: 0 10*3/uL (ref 0.0–0.1)
Basophils Relative: 0.7 % (ref 0.0–3.0)
Eosinophils Absolute: 0.3 10*3/uL (ref 0.0–0.7)
Eosinophils Relative: 5.4 % — ABNORMAL HIGH (ref 0.0–5.0)
HCT: 40.6 % (ref 36.0–46.0)
Hemoglobin: 13.4 g/dL (ref 12.0–15.0)
LYMPHS ABS: 2 10*3/uL (ref 0.7–4.0)
Lymphocytes Relative: 34.7 % (ref 12.0–46.0)
MCHC: 32.9 g/dL (ref 30.0–36.0)
MCV: 90.4 fl (ref 78.0–100.0)
MONO ABS: 0.4 10*3/uL (ref 0.1–1.0)
MONOS PCT: 6.7 % (ref 3.0–12.0)
NEUTROS PCT: 52.5 % (ref 43.0–77.0)
Neutro Abs: 3 10*3/uL (ref 1.4–7.7)
Platelets: 261 10*3/uL (ref 150.0–400.0)
RBC: 4.49 Mil/uL (ref 3.87–5.11)
RDW: 14.2 % (ref 11.5–15.5)
WBC: 5.7 10*3/uL (ref 4.0–10.5)

## 2017-06-25 LAB — BASIC METABOLIC PANEL
BUN: 9 mg/dL (ref 6–23)
CHLORIDE: 100 meq/L (ref 96–112)
CO2: 28 mEq/L (ref 19–32)
Calcium: 9.7 mg/dL (ref 8.4–10.5)
Creatinine, Ser: 0.89 mg/dL (ref 0.40–1.20)
GFR: 91.23 mL/min (ref >60.00)
GLUCOSE: 97 mg/dL (ref 70–99)
POTASSIUM: 3.7 meq/L (ref 3.5–5.1)
Sodium: 137 mEq/L (ref 135–145)

## 2017-06-25 LAB — HEPATIC FUNCTION PANEL
ALT: 11 U/L (ref 0–35)
AST: 14 U/L (ref 0–37)
Albumin: 4.1 g/dL (ref 3.5–5.2)
Alkaline Phosphatase: 65 U/L (ref 39–117)
BILIRUBIN DIRECT: 0.2 mg/dL (ref 0.0–0.3)
BILIRUBIN TOTAL: 0.9 mg/dL (ref 0.2–1.2)
Total Protein: 7.9 g/dL (ref 6.0–8.3)

## 2017-06-25 LAB — LIPID PANEL
CHOL/HDL RATIO: 3
Cholesterol: 134 mg/dL (ref 0–200)
HDL: 44 mg/dL (ref >39.00)
LDL CALC: 78 mg/dL (ref 0–99)
NonHDL: 90.44
TRIGLYCERIDES: 64 mg/dL (ref 0.0–149.0)
VLDL: 12.8 mg/dL (ref 0.0–40.0)

## 2017-06-25 LAB — FERRITIN: FERRITIN: 53.5 ng/mL (ref 10.0–291.0)

## 2017-06-25 LAB — HEMOGLOBIN A1C: HEMOGLOBIN A1C: 5.7 % (ref 4.6–6.5)

## 2017-06-25 LAB — TSH: TSH: 1.41 u[IU]/mL (ref 0.35–4.50)

## 2017-06-26 ENCOUNTER — Encounter: Payer: Self-pay | Admitting: Internal Medicine

## 2017-07-09 DIAGNOSIS — H5213 Myopia, bilateral: Secondary | ICD-10-CM | POA: Diagnosis not present

## 2017-09-11 ENCOUNTER — Encounter: Payer: Self-pay | Admitting: Internal Medicine

## 2017-09-11 ENCOUNTER — Ambulatory Visit (INDEPENDENT_AMBULATORY_CARE_PROVIDER_SITE_OTHER): Payer: 59 | Admitting: Internal Medicine

## 2017-09-11 VITALS — BP 124/94 | HR 82 | Temp 98.6°F | Wt 230.0 lb

## 2017-09-11 DIAGNOSIS — R0981 Nasal congestion: Secondary | ICD-10-CM | POA: Diagnosis not present

## 2017-09-11 DIAGNOSIS — G43809 Other migraine, not intractable, without status migrainosus: Secondary | ICD-10-CM

## 2017-09-11 DIAGNOSIS — I10 Essential (primary) hypertension: Secondary | ICD-10-CM

## 2017-09-11 NOTE — Progress Notes (Signed)
Patient ID: Ariel Perez, female   DOB: 1981/01/26, 36 y.o.   MRN: [id number]   Subjective:    Patient ID: Ariel Perez, female    DOB: [birth year], 36 y.o.   MRN: [id number]  HPI  Patient here for a scheduled follow up.  She reports she is doing relatively well.  Work is going well.  Discussed diet and exercise.  Has gained some weight.  She plans to get back in routine with exercise.  Some increased sinus drainage.  No sinus pressure.  No fever.  No chest congestion or cough.  No acid reflux.  No abdominal pain.  Bowels moving.  Headaches better.  Only taking 1-2 keppra per week.     Past Medical History:  Diagnosis Date  . Headache   . Hypertension   . Migraines   . Osteoarthritis    Past Surgical History:  Procedure Laterality Date  . WISDOM TOOTH EXTRACTION N/A 2008   Approx x4    Family History  Problem Relation Age of Onset  . Cancer Father        unknown  . Diabetes Paternal Grandmother   . Hypertension Maternal Grandmother   . Rheum arthritis Maternal Grandmother   . Cancer Maternal Grandfather    Social History   Socioeconomic History  . Marital status: Single    Spouse name: None  . Number of children: 0  . Years of education: None  . Highest education level: None  Social Needs  . Financial resource strain: None  . Food insecurity - worry: None  . Food insecurity - inability: None  . Transportation needs - medical: None  . Transportation needs - non-medical: None  Occupational History  . Occupation: Financial controller primary care  Tobacco Use  . Smoking status: Never Smoker  . Smokeless tobacco: Never Used  Substance and Sexual Activity  . Alcohol use: Yes    Alcohol/week: 0.0 oz    Comment: occ  . Drug use: No  . Sexual activity: Yes    Partners: Male    Comment: 1 partner  Other Topics Concern  . None  Social History Narrative   Works here at [employer] by herself    2 dogs live inside    Caffeine- Occasional    Right handed    Enjoys  sleeping     Outpatient Encounter Medications as of 09/11/2017  Medication Sig  . almotriptan (AXERT) 12.5 MG tablet Take 12.5 mg by mouth 2 (two) times daily.  . hydrochlorothiazide (MICROZIDE) 12.5 MG capsule Take 1 capsule (12.5 mg total) by mouth daily.  Marland Kitchen levETIRAcetam (KEPPRA) 500 MG tablet Take 500 mg by mouth 2 (two) times daily.  . SUMAtriptan (IMITREX) 6 MG/0.5ML SOLN injection Inject 0.5 mLs (6 mg total) into the skin every 2 (two) hours as needed for migraine or headache (not to exceed 2 doses in 24 hours). May repeat in 2 hours if headache persists or recurs.  . SUMAtriptan Succinate (ONZETRA XSAIL) 11 MG/NOSEPC EXHP Place 1 Dose into both nostrils once. Administer 2 nosepieces (1 per nostril) at onset of migraine, may repeat dose after 2 hours. Do not exceed 2 doses in 24 hours.   No facility-administered encounter medications on file as of 09/11/2017.     Review of Systems  Constitutional: Negative for appetite change and fever.  HENT: Positive for congestion and postnasal drip. Negative for sinus pressure.   Respiratory: Negative for cough, chest tightness and shortness of breath.   Cardiovascular: Negative  for chest pain, palpitations and leg swelling.  Gastrointestinal: Negative for abdominal pain, diarrhea, nausea and vomiting.  Genitourinary: Negative for difficulty urinating and dysuria.  Musculoskeletal: Negative for joint swelling and myalgias.  Skin: Negative for color change and rash.  Neurological: Negative for dizziness and light-headedness.       Headaches much improved.    Psychiatric/Behavioral: Negative for agitation and dysphoric mood.       Objective:    Physical Exam  Constitutional: She appears well-developed and well-nourished. No distress.  HENT:  Nose: Nose normal.  Mouth/Throat: Oropharynx is clear and moist.  Neck: Neck supple. No thyromegaly present.  Cardiovascular: Normal rate and regular rhythm.  Pulmonary/Chest: Breath sounds normal.  No respiratory distress. She has no wheezes.  Abdominal: Soft. Bowel sounds are normal. There is no tenderness.  Musculoskeletal: She exhibits no edema or tenderness.  Lymphadenopathy:    She has no cervical adenopathy.  Skin: No rash noted. No erythema.  Psychiatric: She has a normal mood and affect. Her behavior is normal.    BP (!) 124/94 (BP Location: Left Arm, Patient Position: Sitting, Cuff Size: Normal)   Pulse 82   Temp 98.6 F (37 C) (Oral)   Wt 230 lb (104.3 kg)   BMI 38.27 kg/m  Wt Readings from Last 3 Encounters:  09/11/17 230 lb (104.3 kg)  05/08/17 223 lb 6.4 oz (101.3 kg)  09/18/16 224 lb 12 oz (101.9 kg)     Lab Results  Component Value Date   WBC 5.7 06/25/2017   HGB 13.4 06/25/2017   HCT 40.6 06/25/2017   PLT 261.0 06/25/2017   GLUCOSE 97 06/25/2017   CHOL 134 06/25/2017   TRIG 64.0 06/25/2017   HDL 44.00 06/25/2017   LDLCALC 78 06/25/2017   ALT 11 06/25/2017   AST 14 06/25/2017   NA 137 06/25/2017   K 3.7 06/25/2017   CL 100 06/25/2017   CREATININE 0.89 06/25/2017   BUN 9 06/25/2017   CO2 28 06/25/2017   TSH 1.41 06/25/2017   HGBA1C 5.7 06/25/2017       Assessment & Plan:   Problem List Items Addressed This Visit    Essential (primary) hypertension    Blood pressure elevated today on initial check.  Repeat improved.  Discussed with her regarding spot checking her pressure.  Hold on making changes in her medication.  Follow pressures.  Adjust if persistent elevation.        Headache, migraine    Has been followed by neurology.  On keppra.  Has decreased dose.  Doing well.  Follow.        Other Visit Diagnoses    Nasal congestion    -  Primary   Nasacort nasal spray as directed.  Mucinex as directed.  follow.         Einar Pheasant, MD

## 2017-09-13 ENCOUNTER — Encounter: Payer: Self-pay | Admitting: Internal Medicine

## 2017-09-13 NOTE — Assessment & Plan Note (Signed)
Has been followed by neurology.  On keppra.  Has decreased dose.  Doing well.  Follow.

## 2017-09-13 NOTE — Assessment & Plan Note (Signed)
Blood pressure elevated today on initial check.  Repeat improved.  Discussed with her regarding spot checking her pressure.  Hold on making changes in her medication.  Follow pressures.  Adjust if persistent elevation.

## 2017-10-22 ENCOUNTER — Other Ambulatory Visit: Payer: Self-pay | Admitting: Internal Medicine

## 2017-11-04 ENCOUNTER — Encounter: Payer: Self-pay | Admitting: Internal Medicine

## 2017-11-09 ENCOUNTER — Ambulatory Visit (INDEPENDENT_AMBULATORY_CARE_PROVIDER_SITE_OTHER): Payer: 59 | Admitting: Internal Medicine

## 2017-11-09 ENCOUNTER — Encounter: Payer: Self-pay | Admitting: Internal Medicine

## 2017-11-09 DIAGNOSIS — G43809 Other migraine, not intractable, without status migrainosus: Secondary | ICD-10-CM

## 2017-11-09 DIAGNOSIS — Z6838 Body mass index (BMI) 38.0-38.9, adult: Secondary | ICD-10-CM | POA: Diagnosis not present

## 2017-11-09 DIAGNOSIS — I10 Essential (primary) hypertension: Secondary | ICD-10-CM | POA: Diagnosis not present

## 2017-11-09 MED ORDER — HYDROCHLOROTHIAZIDE 25 MG PO TABS: 25.0000 mg | ORAL_TABLET | Freq: Every day | ORAL | 2 refills | Status: DC

## 2017-11-09 NOTE — Progress Notes (Signed)
Patient ID: Ariel Perez, female   DOB: [birth year], 37 y.o.   MRN: [id number]   Subjective:    Patient ID: Ariel Perez, female    DOB: 1981/04/30, 37 y.o.   MRN: [id number]  HPI  Patient here for a scheduled follow up.  Here to f/u regarding her blood pressure.  Diastolic readings have been a little higher than desired goal.  States she is doing well.  Enjoying her job.  Headaches doing well.  Off verapamil.  No chest pain.  No sob.  No acid reflux reported.  No abdominal pain.  Bowels moving.     Past Medical History:  Diagnosis Date  . Headache   . Hypertension   . Migraines   . Osteoarthritis    Past Surgical History:  Procedure Laterality Date  . WISDOM TOOTH EXTRACTION N/A 2008   Approx x4    Family History  Problem Relation Age of Onset  . Cancer Father        unknown  . Diabetes Paternal Grandmother   . Hypertension Maternal Grandmother   . Rheum arthritis Maternal Grandmother   . Cancer Maternal Grandfather    Social History   Socioeconomic History  . Marital status: Single    Spouse name: None  . Number of children: 0  . Years of education: None  . Highest education level: None  Social Needs  . Financial resource strain: None  . Food insecurity - worry: None  . Food insecurity - inability: None  . Transportation needs - medical: None  . Transportation needs - non-medical: None  Occupational History  . Occupation: Financial controller primary care  Tobacco Use  . Smoking status: Never Smoker  . Smokeless tobacco: Never Used  Substance and Sexual Activity  . Alcohol use: Yes    Alcohol/week: 0.0 oz    Comment: occ  . Drug use: No  . Sexual activity: Yes    Partners: Male    Comment: 1 partner  Other Topics Concern  . None  Social History Narrative   Works here at [employer] by herself    2 dogs live inside    Caffeine- Occasional    Right handed    Enjoys sleeping     Outpatient Encounter Medications as of 11/09/2017  Medication Sig  .  almotriptan (AXERT) 12.5 MG tablet Take 12.5 mg by mouth 2 (two) times daily.  Marland Kitchen levETIRAcetam (KEPPRA) 500 MG tablet Take 500 mg by mouth 2 (two) times daily.  . SUMAtriptan (IMITREX) 6 MG/0.5ML SOLN injection Inject 0.5 mLs (6 mg total) into the skin every 2 (two) hours as needed for migraine or headache (not to exceed 2 doses in 24 hours). May repeat in 2 hours if headache persists or recurs.  . SUMAtriptan Succinate (ONZETRA XSAIL) 11 MG/NOSEPC EXHP Place 1 Dose into both nostrils once. Administer 2 nosepieces (1 per nostril) at onset of migraine, may repeat dose after 2 hours. Do not exceed 2 doses in 24 hours.  . [DISCONTINUED] hydrochlorothiazide (MICROZIDE) 12.5 MG capsule TAKE 1 CAPSULE (12.5 MG TOTAL) BY MOUTH DAILY.  . hydrochlorothiazide (HYDRODIURIL) 25 MG tablet Take 1 tablet (25 mg total) by mouth daily.   No facility-administered encounter medications on file as of 11/09/2017.     Review of Systems  Constitutional: Negative for appetite change and unexpected weight change.  HENT: Negative for congestion and sinus pressure.   Respiratory: Negative for cough, chest tightness and shortness of breath.   Cardiovascular: Negative for chest  pain, palpitations and leg swelling.  Gastrointestinal: Negative for abdominal pain, diarrhea, nausea and vomiting.  Genitourinary: Negative for difficulty urinating and dysuria.  Musculoskeletal: Negative for joint swelling and myalgias.  Skin: Negative for color change and rash.  Neurological: Negative for dizziness, light-headedness and headaches.  Psychiatric/Behavioral: Negative for agitation and dysphoric mood.       Objective:    Physical Exam  Constitutional: She appears well-developed and well-nourished. No distress.  HENT:  Nose: Nose normal.  Mouth/Throat: Oropharynx is clear and moist.  Neck: Neck supple. No thyromegaly present.  Cardiovascular: Normal rate and regular rhythm.  Pulmonary/Chest: Breath sounds normal. No  respiratory distress. She has no wheezes.  Abdominal: Soft. Bowel sounds are normal. There is no tenderness.  Musculoskeletal: She exhibits no edema or tenderness.  Lymphadenopathy:    She has no cervical adenopathy.  Skin: No rash noted. No erythema.  Psychiatric: She has a normal mood and affect. Her behavior is normal.    BP 126/78 (BP Location: Left Arm, Patient Position: Sitting, Cuff Size: Large)   Pulse 89   Temp 98.3 F (36.8 C) (Oral)   Resp 16   Wt 229 lb 6.4 oz (104.1 kg)   SpO2 98%   BMI 38.17 kg/m  Wt Readings from Last 3 Encounters:  11/09/17 229 lb 6.4 oz (104.1 kg)  09/11/17 230 lb (104.3 kg)  05/08/17 223 lb 6.4 oz (101.3 kg)     Lab Results  Component Value Date   WBC 5.7 06/25/2017   HGB 13.4 06/25/2017   HCT 40.6 06/25/2017   PLT 261.0 06/25/2017   GLUCOSE 97 06/25/2017   CHOL 134 06/25/2017   TRIG 64.0 06/25/2017   HDL 44.00 06/25/2017   LDLCALC 78 06/25/2017   ALT 11 06/25/2017   AST 14 06/25/2017   NA 137 06/25/2017   K 3.7 06/25/2017   CL 100 06/25/2017   CREATININE 0.89 06/25/2017   BUN 9 06/25/2017   CO2 28 06/25/2017   TSH 1.41 06/25/2017   HGBA1C 5.7 06/25/2017       Assessment & Plan:   Problem List Items Addressed This Visit    Benign essential HTN    Diastolic readings on outside checks a little elevated above goal.  Recheck by me here - slightly increased diastolic reading.  Have her increase hctz to 56m q day.  Follow pressures.  Have her send in readings over the next 3-4 weeks.  Follow metabolic panel.       Relevant Medications   hydrochlorothiazide (HYDRODIURIL) 25 MG tablet   Other Relevant Orders   Basic metabolic panel   BMI 62.7-03.5,KKXFG    Discussed diet and exercise.  Follow.       Headache, migraine    Has been followed by neurology.  Doing well.  On keppra.  Follow.        Relevant Medications   hydrochlorothiazide (HYDRODIURIL) 25 MG tablet       Einar Pheasant, MD

## 2017-11-12 ENCOUNTER — Encounter: Payer: Self-pay | Admitting: Internal Medicine

## 2017-11-12 NOTE — Assessment & Plan Note (Signed)
Has been followed by neurology.  Doing well.  On keppra.  Follow.

## 2017-11-12 NOTE — Assessment & Plan Note (Signed)
Diastolic readings on outside checks a little elevated above goal.  Recheck by me here - slightly increased diastolic reading.  Have her increase hctz to 27m q day.  Follow pressures.  Have her send in readings over the next 3-4 weeks.  Follow metabolic panel.

## 2017-11-12 NOTE — Assessment & Plan Note (Signed)
Discussed diet and exercise.  Follow.  

## 2017-11-13 ENCOUNTER — Encounter: Payer: Self-pay | Admitting: Internal Medicine

## 2017-11-15 MED ORDER — LISINOPRIL 10 MG PO TABS: 10.0000 mg | ORAL_TABLET | Freq: Every day | ORAL | 1 refills | Status: DC

## 2017-11-15 NOTE — Telephone Encounter (Signed)
Discussed with pt regarding her blood pressures.  Discussed starting lisinopril 67m q day.  She is agreeable.  Will continue hctz 263mq day for now.  Will get met b in 10-14 days.  Pt aware.  Will monitor blood pressures and send in readings.  Discussed pregnancy concerns.

## 2017-11-23 ENCOUNTER — Encounter: Payer: Self-pay | Admitting: Internal Medicine

## 2017-11-23 NOTE — Telephone Encounter (Signed)
Called pt.  Offered evaluation in the clinic.  She declined.  Does not feel she needs to be seen.  States symptoms began when she started the lisinopril.  Takes at night.  Will stop the lisinopril.  Call with update over the next 24-48 hours.  Will monitor blood pressure.  Will have to try another blood pressure medication, but want to confirm symptoms resolve.

## 2017-11-25 ENCOUNTER — Encounter: Payer: Self-pay | Admitting: Internal Medicine

## 2017-11-26 ENCOUNTER — Encounter: Payer: Self-pay | Admitting: Internal Medicine

## 2017-12-17 ENCOUNTER — Other Ambulatory Visit (INDEPENDENT_AMBULATORY_CARE_PROVIDER_SITE_OTHER): Payer: 59

## 2017-12-17 DIAGNOSIS — I10 Essential (primary) hypertension: Secondary | ICD-10-CM

## 2017-12-17 LAB — BASIC METABOLIC PANEL
BUN: 7 mg/dL (ref 6–23)
CO2: 31 mEq/L (ref 19–32)
CREATININE: 0.93 mg/dL (ref 0.40–1.20)
Calcium: 9.3 mg/dL (ref 8.4–10.5)
Chloride: 100 mEq/L (ref 96–112)
GFR: 86.5 mL/min (ref >60.00)
GLUCOSE: 117 mg/dL — AB (ref 70–99)
Potassium: 3.3 mEq/L — ABNORMAL LOW (ref 3.5–5.1)
Sodium: 138 mEq/L (ref 135–145)

## 2017-12-18 ENCOUNTER — Other Ambulatory Visit: Payer: Self-pay | Admitting: Internal Medicine

## 2017-12-18 DIAGNOSIS — E876 Hypokalemia: Secondary | ICD-10-CM

## 2017-12-18 NOTE — Progress Notes (Signed)
Order placed for f/u potassium check.  

## 2017-12-21 ENCOUNTER — Other Ambulatory Visit: Payer: Self-pay | Admitting: Internal Medicine

## 2017-12-21 DIAGNOSIS — R739 Hyperglycemia, unspecified: Secondary | ICD-10-CM

## 2017-12-21 NOTE — Progress Notes (Signed)
Order placed for a1c

## 2017-12-29 ENCOUNTER — Other Ambulatory Visit (INDEPENDENT_AMBULATORY_CARE_PROVIDER_SITE_OTHER): Payer: 59

## 2017-12-29 DIAGNOSIS — R739 Hyperglycemia, unspecified: Secondary | ICD-10-CM | POA: Diagnosis not present

## 2017-12-29 DIAGNOSIS — E876 Hypokalemia: Secondary | ICD-10-CM

## 2017-12-29 LAB — HEMOGLOBIN A1C: Hgb A1c MFr Bld: 6 % (ref 4.6–6.5)

## 2017-12-29 LAB — POTASSIUM: Potassium: 3.6 mEq/L (ref 3.5–5.1)

## 2017-12-30 ENCOUNTER — Encounter: Payer: Self-pay | Admitting: Internal Medicine

## 2018-02-09 ENCOUNTER — Ambulatory Visit: Payer: 59 | Admitting: Internal Medicine

## 2018-02-09 VITALS — BP 132/82 | HR 86 | Temp 98.8°F | Resp 18 | Wt 228.2 lb

## 2018-02-09 DIAGNOSIS — Z6837 Body mass index (BMI) 37.0-37.9, adult: Secondary | ICD-10-CM

## 2018-02-09 DIAGNOSIS — G43809 Other migraine, not intractable, without status migrainosus: Secondary | ICD-10-CM | POA: Diagnosis not present

## 2018-02-09 DIAGNOSIS — R059 Cough, unspecified: Secondary | ICD-10-CM

## 2018-02-09 DIAGNOSIS — R05 Cough: Secondary | ICD-10-CM | POA: Diagnosis not present

## 2018-02-09 DIAGNOSIS — I10 Essential (primary) hypertension: Secondary | ICD-10-CM | POA: Diagnosis not present

## 2018-02-09 DIAGNOSIS — R739 Hyperglycemia, unspecified: Secondary | ICD-10-CM | POA: Diagnosis not present

## 2018-02-09 NOTE — Progress Notes (Signed)
Patient ID: Ariel Perez, female   DOB: [birth year], 37 y.o.   MRN: [id number]   Subjective:    Patient ID: Ariel Perez, female    DOB: Feb 15, 1981, 37 y.o.   MRN: [id number]  HPI  Patient here for a scheduled follow up.  She reports she is doing well.  Feels good.  Trying to stay active.  Discussed diet and exercise.  Tolerating hctz.  Blood pressure is doing well.  No chest pain.  Breathing stable.  No abdominal pain.  Bowels stable.  Headaches doing well.  Some cough.     Past Medical History:  Diagnosis Date  . Headache   . Hypertension   . Migraines   . Osteoarthritis    Past Surgical History:  Procedure Laterality Date  . WISDOM TOOTH EXTRACTION N/A 2008   Approx x4    Family History  Problem Relation Age of Onset  . Cancer Father        unknown  . Diabetes Paternal Grandmother   . Hypertension Maternal Grandmother   . Rheum arthritis Maternal Grandmother   . Cancer Maternal Grandfather    Social History   Socioeconomic History  . Marital status: Single    Spouse name: Not on file  . Number of children: 0  . Years of education: Not on file  . Highest education level: Not on file  Occupational History  . Occupation: Financial controller primary care  Social Needs  . Financial resource strain: Not on file  . Food insecurity:    Worry: Not on file    Inability: Not on file  . Transportation needs:    Medical: Not on file    Non-medical: Not on file  Tobacco Use  . Smoking status: Never Smoker  . Smokeless tobacco: Never Used  Substance and Sexual Activity  . Alcohol use: Yes    Alcohol/week: 0.0 oz    Comment: occ  . Drug use: No  . Sexual activity: Yes    Partners: Male    Comment: 1 partner  Lifestyle  . Physical activity:    Days per week: Not on file    Minutes per session: Not on file  . Stress: Not on file  Relationships  . Social connections:    Talks on phone: Not on file    Gets together: Not on file    Attends religious service: Not on file      Active member of club or organization: Not on file    Attends meetings of clubs or organizations: Not on file    Relationship status: Not on file  Other Topics Concern  . Not on file  Social History Narrative   Works here at [employer] by herself    2 dogs live inside    Caffeine- Occasional    Right handed    Enjoys sleeping     Outpatient Encounter Medications as of 02/09/2018  Medication Sig  . almotriptan (AXERT) 12.5 MG tablet Take 12.5 mg by mouth 2 (two) times daily.  . hydrochlorothiazide (HYDRODIURIL) 25 MG tablet Take 1 tablet (25 mg total) by mouth daily.  Marland Kitchen levETIRAcetam (KEPPRA) 500 MG tablet Take 500 mg by mouth 2 (two) times daily.  . SUMAtriptan (IMITREX) 6 MG/0.5ML SOLN injection Inject 0.5 mLs (6 mg total) into the skin every 2 (two) hours as needed for migraine or headache (not to exceed 2 doses in 24 hours). May repeat in 2 hours if headache persists or recurs.  . SUMAtriptan  Succinate (ONZETRA XSAIL) 11 MG/NOSEPC EXHP Place 1 Dose into both nostrils once. Administer 2 nosepieces (1 per nostril) at onset of migraine, may repeat dose after 2 hours. Do not exceed 2 doses in 24 hours.  . [DISCONTINUED] lisinopril (PRINIVIL,ZESTRIL) 10 MG tablet Take 1 tablet (10 mg total) by mouth daily.   No facility-administered encounter medications on file as of 02/09/2018.     Review of Systems  Constitutional: Negative for appetite change and unexpected weight change.  HENT: Negative for congestion and sinus pressure.   Respiratory: Positive for cough. Negative for chest tightness and shortness of breath.   Cardiovascular: Negative for chest pain, palpitations and leg swelling.  Gastrointestinal: Negative for abdominal pain, diarrhea, nausea and vomiting.  Genitourinary: Negative for difficulty urinating and dysuria.  Musculoskeletal: Negative for joint swelling and myalgias.  Skin: Negative for color change and rash.  Neurological: Negative for dizziness and  light-headedness.       Headaches under control.    Psychiatric/Behavioral: Negative for agitation and dysphoric mood.       Objective:    Physical Exam  Constitutional: She appears well-developed and well-nourished. No distress.  HENT:  Nose: Nose normal.  Mouth/Throat: Oropharynx is clear and moist.  Neck: Neck supple. No thyromegaly present.  Cardiovascular: Normal rate and regular rhythm.  Pulmonary/Chest: Breath sounds normal. No respiratory distress. She has no wheezes.  Abdominal: Soft. Bowel sounds are normal. There is no tenderness.  Musculoskeletal: She exhibits no edema or tenderness.  Lymphadenopathy:    She has no cervical adenopathy.  Skin: No rash noted. No erythema.  Psychiatric: She has a normal mood and affect. Her behavior is normal.    BP 132/82 (BP Location: Left Arm, Patient Position: Sitting, Cuff Size: Normal)   Pulse 86   Temp 98.8 F (37.1 C) (Oral)   Resp 18   Wt 228 lb 3.2 oz (103.5 kg)   SpO2 98%   BMI 37.97 kg/m  Wt Readings from Last 3 Encounters:  02/09/18 228 lb 3.2 oz (103.5 kg)  11/09/17 229 lb 6.4 oz (104.1 kg)  09/11/17 230 lb (104.3 kg)     Lab Results  Component Value Date   WBC 5.7 06/25/2017   HGB 13.4 06/25/2017   HCT 40.6 06/25/2017   PLT 261.0 06/25/2017   GLUCOSE 117 (H) 12/17/2017   CHOL 134 06/25/2017   TRIG 64.0 06/25/2017   HDL 44.00 06/25/2017   LDLCALC 78 06/25/2017   ALT 11 06/25/2017   AST 14 06/25/2017   NA 138 12/17/2017   K 3.6 12/29/2017   CL 100 12/17/2017   CREATININE 0.93 12/17/2017   BUN 7 12/17/2017   CO2 31 12/17/2017   TSH 1.41 06/25/2017   HGBA1C 6.0 12/29/2017       Assessment & Plan:   Problem List Items Addressed This Visit    Benign essential HTN    Blood pressure under good control.  Continue same medication regimen.  Follow pressures.  Follow metabolic panel.        Relevant Orders   Hepatic function panel   Lipid panel   Basic metabolic panel   BMI 27.0-62.3, adult     Discussed diet and exercise.  Follow.       Headache, migraine    Doing well on current regimen.  Has been followed by neurology.        Other Visit Diagnoses    Cough    -  Primary   some congestion.  saline nasal spray  and steroid nasal spray as directed.  Rotibussin.  notify me if persistent.     Hyperglycemia       Relevant Orders   Hemoglobin A1c       Einar Pheasant, MD

## 2018-02-09 NOTE — Patient Instructions (Signed)
Saline nasal spray - flush nose at least 2-3x/day  nasacort nasal spray - 2 sprays each nostril one time per day.  Do this in the evening.    Robitussin DM twice a day as needed.  

## 2018-02-15 ENCOUNTER — Encounter: Payer: Self-pay | Admitting: Internal Medicine

## 2018-02-15 NOTE — Assessment & Plan Note (Signed)
Doing well on current regimen.  Has been followed by neurology.  

## 2018-02-15 NOTE — Assessment & Plan Note (Signed)
Discussed diet and exercise.  Follow.  

## 2018-02-15 NOTE — Assessment & Plan Note (Signed)
Blood pressure under good control.  Continue same medication regimen.  Follow pressures.  Follow metabolic panel.   

## 2018-04-16 DIAGNOSIS — Z01419 Encounter for gynecological examination (general) (routine) without abnormal findings: Secondary | ICD-10-CM | POA: Diagnosis not present

## 2018-04-16 DIAGNOSIS — Z6837 Body mass index (BMI) 37.0-37.9, adult: Secondary | ICD-10-CM | POA: Diagnosis not present

## 2018-06-14 ENCOUNTER — Ambulatory Visit: Payer: 59 | Admitting: Internal Medicine

## 2018-06-14 ENCOUNTER — Ambulatory Visit (INDEPENDENT_AMBULATORY_CARE_PROVIDER_SITE_OTHER): Payer: 59

## 2018-06-14 ENCOUNTER — Encounter: Payer: Self-pay | Admitting: Internal Medicine

## 2018-06-14 DIAGNOSIS — R05 Cough: Secondary | ICD-10-CM

## 2018-06-14 DIAGNOSIS — I10 Essential (primary) hypertension: Secondary | ICD-10-CM | POA: Diagnosis not present

## 2018-06-14 DIAGNOSIS — G43809 Other migraine, not intractable, without status migrainosus: Secondary | ICD-10-CM

## 2018-06-14 DIAGNOSIS — R053 Chronic cough: Secondary | ICD-10-CM

## 2018-06-14 DIAGNOSIS — R739 Hyperglycemia, unspecified: Secondary | ICD-10-CM | POA: Diagnosis not present

## 2018-06-14 DIAGNOSIS — Z6837 Body mass index (BMI) 37.0-37.9, adult: Secondary | ICD-10-CM | POA: Diagnosis not present

## 2018-06-14 DIAGNOSIS — Z23 Encounter for immunization: Secondary | ICD-10-CM

## 2018-06-14 LAB — HEPATIC FUNCTION PANEL
ALT: 12 U/L (ref 0–35)
AST: 16 U/L (ref 0–37)
Albumin: 4.5 g/dL (ref 3.5–5.2)
Alkaline Phosphatase: 65 U/L (ref 39–117)
BILIRUBIN DIRECT: 0.1 mg/dL (ref 0.0–0.3)
BILIRUBIN TOTAL: 0.7 mg/dL (ref 0.2–1.2)
Total Protein: 8.5 g/dL — ABNORMAL HIGH (ref 6.0–8.3)

## 2018-06-14 LAB — LIPID PANEL
Cholesterol: 140 mg/dL (ref 0–200)
HDL: 45.2 mg/dL
LDL Cholesterol: 82 mg/dL (ref 0–99)
NonHDL: 94.93
Total CHOL/HDL Ratio: 3
Triglycerides: 67 mg/dL (ref 0.0–149.0)
VLDL: 13.4 mg/dL (ref 0.0–40.0)

## 2018-06-14 LAB — BASIC METABOLIC PANEL
BUN: 9 mg/dL (ref 6–23)
CALCIUM: 10.1 mg/dL (ref 8.4–10.5)
CO2: 30 mEq/L (ref 19–32)
CREATININE: 0.96 mg/dL (ref 0.40–1.20)
Chloride: 98 mEq/L (ref 96–112)
GFR: 83.17 mL/min (ref >60.00)
GLUCOSE: 99 mg/dL (ref 70–99)
Potassium: 3.5 mEq/L (ref 3.5–5.1)
Sodium: 135 mEq/L (ref 135–145)

## 2018-06-14 LAB — HEMOGLOBIN A1C: Hgb A1c MFr Bld: 6 % (ref 4.6–6.5)

## 2018-06-14 MED ORDER — HYDROCHLOROTHIAZIDE 25 MG PO TABS: 25.0000 mg | ORAL_TABLET | Freq: Every day | ORAL | 2 refills | Status: DC

## 2018-06-14 NOTE — Progress Notes (Signed)
Patient ID: Ariel Perez, female   DOB: 02/18/1981, 37 y.o.   MRN: [id number]   Subjective:    Patient ID: Ariel Perez, female    DOB: 21-Aug-1981, 37 y.o.   MRN: [id number]  HPI  Patient here for a scheduled follow up.  She reports she is doing well.  Feels good.  Going to school.  Some increased stress related to this, but handling things well.  Trying to stay active.  No chest pain.  No sob.  Still with some dry cough and continued clearing of her throat.  Discussed treatment for acid reflux.  No abdominal pain.  Bowels moving.  LMP 2 weeks ago.  Has IUD.  Denies possibility of being pregnant.  Blood pressure doing well.     Past Medical History:  Diagnosis Date  . Headache   . Hypertension   . Migraines   . Osteoarthritis    Past Surgical History:  Procedure Laterality Date  . WISDOM TOOTH EXTRACTION N/A 2008   Approx x4    Family History  Problem Relation Age of Onset  . Cancer Father        unknown  . Diabetes Paternal Grandmother   . Hypertension Maternal Grandmother   . Rheum arthritis Maternal Grandmother   . Cancer Maternal Grandfather    Social History   Socioeconomic History  . Marital status: Single    Spouse name: Not on file  . Number of children: 0  . Years of education: Not on file  . Highest education level: Not on file  Occupational History  . Occupation: Financial controller primary care  Social Needs  . Financial resource strain: Not on file  . Food insecurity:    Worry: Not on file    Inability: Not on file  . Transportation needs:    Medical: Not on file    Non-medical: Not on file  Tobacco Use  . Smoking status: Never Smoker  . Smokeless tobacco: Never Used  Substance and Sexual Activity  . Alcohol use: Yes    Alcohol/week: 0.0 standard drinks    Comment: occ  . Drug use: No  . Sexual activity: Yes    Partners: Male    Comment: 1 partner  Lifestyle  . Physical activity:    Days per week: Not on file    Minutes per session: Not on file    . Stress: Not on file  Relationships  . Social connections:    Talks on phone: Not on file    Gets together: Not on file    Attends religious service: Not on file    Active member of club or organization: Not on file    Attends meetings of clubs or organizations: Not on file    Relationship status: Not on file  Other Topics Concern  . Not on file  Social History Narrative   Works here at [employer] by herself    2 dogs live inside    Caffeine- Occasional    Right handed    Enjoys sleeping     Outpatient Encounter Medications as of 06/14/2018  Medication Sig  . almotriptan (AXERT) 12.5 MG tablet Take 12.5 mg by mouth 2 (two) times daily.  . hydrochlorothiazide (HYDRODIURIL) 25 MG tablet Take 1 tablet (25 mg total) by mouth daily.  Marland Kitchen levETIRAcetam (KEPPRA) 500 MG tablet Take 500 mg by mouth 2 (two) times daily.  . SUMAtriptan (IMITREX) 6 MG/0.5ML SOLN injection Inject 0.5 mLs (6 mg total) into the  skin every 2 (two) hours as needed for migraine or headache (not to exceed 2 doses in 24 hours). May repeat in 2 hours if headache persists or recurs.  . SUMAtriptan Succinate (ONZETRA XSAIL) 11 MG/NOSEPC EXHP Place 1 Dose into both nostrils once. Administer 2 nosepieces (1 per nostril) at onset of migraine, may repeat dose after 2 hours. Do not exceed 2 doses in 24 hours.  . [DISCONTINUED] hydrochlorothiazide (HYDRODIURIL) 25 MG tablet Take 1 tablet (25 mg total) by mouth daily.   No facility-administered encounter medications on file as of 06/14/2018.     Review of Systems  Constitutional: Negative for appetite change and unexpected weight change.  HENT: Negative for congestion and sinus pressure.   Respiratory: Positive for cough. Negative for chest tightness and shortness of breath.   Cardiovascular: Negative for chest pain, palpitations and leg swelling.  Gastrointestinal: Negative for abdominal pain, diarrhea, nausea and vomiting.  Genitourinary: Negative for difficulty  urinating and dysuria.  Musculoskeletal: Negative for joint swelling and myalgias.  Skin: Negative for color change and rash.  Neurological: Negative for dizziness, light-headedness and headaches.  Psychiatric/Behavioral: Negative for agitation and dysphoric mood.       Objective:    Physical Exam  Constitutional: She appears well-developed and well-nourished. No distress.  HENT:  Nose: Nose normal.  Mouth/Throat: Oropharynx is clear and moist.  Neck: Neck supple. No thyromegaly present.  Cardiovascular: Normal rate and regular rhythm.  Pulmonary/Chest: Breath sounds normal. No respiratory distress. She has no wheezes.  Abdominal: Soft. Bowel sounds are normal. There is no tenderness.  Musculoskeletal: She exhibits no edema or tenderness.  Lymphadenopathy:    She has no cervical adenopathy.  Skin: No rash noted. No erythema.  Psychiatric: She has a normal mood and affect. Her behavior is normal.    BP 124/80 (BP Location: Left Arm, Patient Position: Sitting, Cuff Size: Normal)   Pulse 99   Temp 98.8 F (37.1 C) (Oral)   Resp 18   Wt 225 lb 6.4 oz (102.2 kg)   LMP 05/31/2018 (Exact Date)   SpO2 97%   BMI 37.51 kg/m  Wt Readings from Last 3 Encounters:  06/14/18 225 lb 6.4 oz (102.2 kg)  02/09/18 228 lb 3.2 oz (103.5 kg)  11/09/17 229 lb 6.4 oz (104.1 kg)     Lab Results  Component Value Date   WBC 5.7 06/25/2017   HGB 13.4 06/25/2017   HCT 40.6 06/25/2017   PLT 261.0 06/25/2017   GLUCOSE 99 06/14/2018   CHOL 140 06/14/2018   TRIG 67.0 06/14/2018   HDL 45.20 06/14/2018   LDLCALC 82 06/14/2018   ALT 12 06/14/2018   AST 16 06/14/2018   NA 135 06/14/2018   K 3.5 06/14/2018   CL 98 06/14/2018   CREATININE 0.96 06/14/2018   BUN 9 06/14/2018   CO2 30 06/14/2018   TSH 1.41 06/25/2017   HGBA1C 6.0 06/14/2018       Assessment & Plan:   Problem List Items Addressed This Visit    Benign essential HTN    Blood pressure under good control.  Continue same  medication regimen.  Follow pressures.  Follow metabolic panel.        Relevant Medications   hydrochlorothiazide (HYDRODIURIL) 25 MG tablet   BMI 37.0-37.9, adult    Discussed diet and exercise.  Follow.       Headache, migraine    Doing well on current regimen.  Has been followed by neurology.  Relevant Medications   hydrochlorothiazide (HYDRODIURIL) 25 MG tablet   Persistent cough    Persistent dry cough.  Treat allergies.  Check cxr given persistent.  Treat acid reflux.  Follow.        Relevant Orders   DG Chest 2 View (Completed)    Other Visit Diagnoses    Hyperglycemia       Need for immunization against influenza       Relevant Orders   Flu Vaccine QUAD 36+ mos IM (Completed)       Einar Pheasant, MD

## 2018-06-15 ENCOUNTER — Other Ambulatory Visit: Payer: Self-pay | Admitting: Internal Medicine

## 2018-06-15 ENCOUNTER — Encounter: Payer: Self-pay | Admitting: Internal Medicine

## 2018-06-15 DIAGNOSIS — R779 Abnormality of plasma protein, unspecified: Secondary | ICD-10-CM

## 2018-06-15 NOTE — Progress Notes (Signed)
Order placed for f/u lab.   

## 2018-06-20 ENCOUNTER — Encounter: Payer: Self-pay | Admitting: Internal Medicine

## 2018-06-20 NOTE — Assessment & Plan Note (Signed)
Persistent dry cough.  Treat allergies.  Check cxr given persistent.  Treat acid reflux.  Follow.

## 2018-06-20 NOTE — Assessment & Plan Note (Signed)
Discussed diet and exercise.  Follow.  

## 2018-06-20 NOTE — Assessment & Plan Note (Signed)
Doing well on current regimen.  Has been followed by neurology.

## 2018-06-20 NOTE — Assessment & Plan Note (Signed)
Blood pressure under good control.  Continue same medication regimen.  Follow pressures.  Follow metabolic panel.   

## 2018-07-21 ENCOUNTER — Other Ambulatory Visit (INDEPENDENT_AMBULATORY_CARE_PROVIDER_SITE_OTHER): Payer: 59

## 2018-07-21 DIAGNOSIS — R779 Abnormality of plasma protein, unspecified: Secondary | ICD-10-CM | POA: Diagnosis not present

## 2018-07-21 LAB — PROTEIN, TOTAL: Total Protein: 7.8 g/dL (ref 6.0–8.3)

## 2018-07-22 ENCOUNTER — Encounter: Payer: Self-pay | Admitting: Internal Medicine

## 2018-08-31 ENCOUNTER — Encounter (HOSPITAL_COMMUNITY): Payer: Self-pay | Admitting: Emergency Medicine

## 2018-08-31 ENCOUNTER — Other Ambulatory Visit: Payer: Self-pay

## 2018-08-31 ENCOUNTER — Ambulatory Visit (HOSPITAL_COMMUNITY)
Admission: EM | Admit: 2018-08-31 | Discharge: 2018-08-31 | Disposition: A | Discharge status: Home / Self Care | Payer: 59 | Attending: Family Medicine | Admitting: Family Medicine

## 2018-08-31 DIAGNOSIS — S29012A Strain of muscle and tendon of back wall of thorax, initial encounter: Secondary | ICD-10-CM | POA: Diagnosis not present

## 2018-08-31 MED ORDER — NAPROXEN 500 MG PO TABS: 500.0000 mg | ORAL_TABLET | Freq: Two times a day (BID) | ORAL | 0 refills | Status: DC

## 2018-08-31 MED ORDER — CYCLOBENZAPRINE HCL 10 MG PO TABS: 10.0000 mg | ORAL_TABLET | Freq: Two times a day (BID) | ORAL | 0 refills | Status: DC | PRN

## 2018-08-31 NOTE — ED Provider Notes (Signed)
[facility]    CSN: [id number] Arrival date & time: 08/31/18  0809     History   Chief Complaint Chief Complaint  Patient presents with  . Back Pain    HPI Ariel Perez is a 37 y.o. female history of hypertension, migraines, osteoarthritis presenting today for evaluation of back pain.  Patient states that over the past week she has had right-sided back pain that begins in her lower back and shoots up approximately to her bra line.  She has occasional spasming.  Occasionally will wake her up at night.  Denies any specific injury or increase in activity, but does states she has a lot of lifting at home.  She has tried ibuprofen 800 mg with modest relief.  She has also been using Biofreeze, and alternating ice and heat.  She denies any weakness.  Denies any radiation into the legs.  Occasional tingling sensation around area of pain, but denies numbness.  Denies saddle anesthesia.  Denies loss of bowel or bladder control.  HPI  Past Medical History:  Diagnosis Date  . Headache   . Hypertension   . Migraines   . Osteoarthritis     Patient Active Problem List   Diagnosis Date Noted  . Persistent cough 06/14/2018  . Intractable migraine without aura and with status migrainosus 01/28/2016  . Nausea without vomiting 01/28/2016  . Allergic reaction 12/14/2015  . Snoring 11/16/2015  . Acute confusional migraine 11/16/2015  . Intractable migraine with aura with status migrainosus 11/16/2015  . Sleep related headaches 11/16/2015  . BMI 37.0-37.9, adult 11/16/2015  . Benign essential HTN 07/15/2015  . Migraine without aura and without status migrainosus, not intractable 05/09/2015  . Thoracic back pain 04/10/2015  . Hyponatremia 04/10/2015  . BP (high blood pressure) 02/20/2013  . Synovitis of knee 02/20/2013  . Pap smear abnormality of cervix/human papillomavirus (HPV) positive 01/20/2012  . 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 Headache, migraine 10/09/2011     Past Surgical History:  Procedure Laterality Date  . WISDOM TOOTH EXTRACTION N/A 2008   Approx x4     OB History   None      Home Medications    Prior to Admission medications   Medication Sig Start Date End Date Taking? Authorizing Provider  almotriptan (AXERT) 12.5 MG tablet Take 12.5 mg by mouth 2 (two) times daily. 11/27/16 11/27/17  [provider]  cyclobenzaprine (FLEXERIL) 10 MG tablet Take 1 tablet (10 mg total) by mouth 2 (two) times daily as needed for muscle spasms. 08/31/18   Daejon Lich C, PA-C  hydrochlorothiazide (HYDRODIURIL) 25 MG tablet Take 1 tablet (25 mg total) by mouth daily. 06/14/18   Einar Pheasant, MD  levETIRAcetam (KEPPRA) 500 MG tablet Take 500 mg by mouth 2 (two) times daily. 04/14/17   [provider]  naproxen (NAPROSYN) 500 MG tablet Take 1 tablet (500 mg total) by mouth 2 (two) times daily. 08/31/18   Ashritha Desrosiers C, PA-C  SUMAtriptan (IMITREX) 6 MG/0.5ML SOLN injection Inject 0.5 mLs (6 mg total) into the skin every 2 (two) hours as needed for migraine or headache (not to exceed 2 doses in 24 hours). May repeat in 2 hours if headache persists or recurs. 03/02/15   Pieter Partridge, DO  SUMAtriptan Succinate (ONZETRA XSAIL) 11 MG/NOSEPC EXHP Place 1 Dose into both nostrils once. Administer 2 nosepieces (1 per nostril) at onset of migraine, may repeat dose after 2 hours. Do not exceed 2 doses in 24 hours. 11/16/15  Dohmeier, Asencion Partridge, MD    Family History Family History  Problem Relation Age of Onset  . Cancer Father        unknown  . Diabetes Paternal Grandmother   . Hypertension Maternal Grandmother   . Rheum arthritis Maternal Grandmother   . Cancer Maternal Grandfather     Social History Social History   Tobacco Use  . Smoking status: Never Smoker  . Smokeless tobacco: Never Used  Substance Use Topics  . Alcohol use: Yes    Alcohol/week: 0.0 standard drinks    Comment: occ  . Drug use: No     Allergies    Patient has no known allergies.   Review of Systems Review of Systems  Constitutional: Negative for fatigue and fever.  Eyes: Negative for visual disturbance.  Respiratory: Negative for cough and shortness of breath.   Cardiovascular: Negative for chest pain.  Gastrointestinal: Negative for abdominal pain, nausea and vomiting.  Genitourinary: Negative for decreased urine volume and difficulty urinating.  Musculoskeletal: Positive for back pain and myalgias. Negative for arthralgias and joint swelling.  Skin: Negative for color change, rash and wound.  Neurological: Negative for dizziness, weakness, light-headedness and headaches.     Physical Exam Triage Vital Signs ED Triage Vitals  Enc Vitals Group     BP 08/31/18 0834 136/85     Pulse Rate 08/31/18 0834 84     Resp 08/31/18 0834 16     Temp 08/31/18 0834 98.4 F (36.9 C)     Temp Source 08/31/18 0834 Oral     SpO2 08/31/18 0834 100 %     Weight --      Height --      Head Circumference --      Peak Flow --      Pain Score 08/31/18 0833 7     Pain Loc --      Pain Edu? --      Excl. in [place]? --    No data found.  Updated Vital Signs BP 136/85   Pulse 84   Temp 98.4 F (36.9 C) (Oral)   Resp 16   SpO2 100%   Visual Acuity Right Eye Distance:   Left Eye Distance:   Bilateral Distance:    Right Eye Near:   Left Eye Near:    Bilateral Near:     Physical Exam  Constitutional: She appears well-developed and well-nourished. No distress.  HENT:  Head: Normocephalic and atraumatic.  Eyes: Conjunctivae are normal.  Neck: Neck supple.  Cardiovascular: Normal rate and regular rhythm.  No murmur heard. Pulmonary/Chest: Effort normal and breath sounds normal. No respiratory distress.  Abdominal: Soft. There is no tenderness.  Musculoskeletal: She exhibits no edema.  No cervical or thoracic midline tenderness, mild tenderness to the upper lumbar spine midline, increased pain and tenderness to palpation to right  lateral upper lumbar and lower thoracic musculature  Strength at shoulders and hips 5/5 and equal bilaterally  Able to ambulate from chair to exam table without abnormality or assistance  Neurological: She is alert.  Skin: Skin is warm and dry.  Psychiatric: She has a normal mood and affect.  Nursing note and vitals reviewed.    UC Treatments / Results  Labs (all labs ordered are listed, but only abnormal results are displayed) Labs Reviewed - No data to display  EKG None  Radiology No results found.  Procedures Procedures (including critical care time)  Medications Ordered in UC Medications - No data to display  Initial Impression / Assessment and Plan / UC Course  I have reviewed the triage vital signs and the nursing notes.  Pertinent labs & imaging results that were available during my care of the patient were reviewed by me and considered in my medical decision making (see chart for details).     Patient likely with lumbar/thoracic strain.  No specific injury, do not suspect underlying bony abnormality.  Will recommend anti-inflammatories and muscle relaxers.  Patient has previously used Flexeril with relief.  Will provide this again.  Will provide Naprosyn as alternative to ibuprofen.  Discussed strict return precautions. Patient verbalized understanding and is agreeable with plan.  Final Clinical Impressions(s) / UC Diagnoses   Final diagnoses:  Strain of thoracic back region     Discharge Instructions     Use anti-inflammatories for pain/swelling. You may take up to 800 mg Ibuprofen every 8 hours with food. You may supplement Ibuprofen with Tylenol [phone] every 8 hours. OR Naprosyn twice daily with food  You may use flexeril as needed to help with pain. This is a muscle relaxer and causes sedation- please use only at bedtime or when you will be home and not have to drive/work- take 1/2 tab if causing too much drowsiness  Continue ice and heat No heavy  lifting, avoid complete bed rest  Follow up if symptoms worsening or not improving, developing weakness, numbness or tingling.    ED Prescriptions    Medication Sig Dispense Auth. Provider   cyclobenzaprine (FLEXERIL) 10 MG tablet Take 1 tablet (10 mg total) by mouth 2 (two) times daily as needed for muscle spasms. 20 tablet Claudius Mich C, PA-C   naproxen (NAPROSYN) 500 MG tablet Take 1 tablet (500 mg total) by mouth 2 (two) times daily. 30 tablet Damonique Brunelle, [place] C, PA-C     Controlled Substance Prescriptions  Controlled Substance Registry consulted? Not Applicable   Janith Lima, [place] 08/31/18 9811

## 2018-08-31 NOTE — Discharge Instructions (Signed)
Use anti-inflammatories for pain/swelling. You may take up to 800 mg Ibuprofen every 8 hours with food. You may supplement Ibuprofen with Tylenol [phone] mg every 8 hours. OR Naprosyn twice daily with food  You may use flexeril as needed to help with pain. This is a muscle relaxer and causes sedation- please use only at bedtime or when you will be home and not have to drive/work- take 1/2 tab if causing too much drowsiness  Continue ice and heat No heavy lifting, avoid complete bed rest  Follow up if symptoms worsening or not improving, developing weakness, numbness or tingling.

## 2018-08-31 NOTE — ED Triage Notes (Signed)
PT reports lower right sided back pain that started after lifting objects last week. PT reports pain is spasming.    PT is taking 800 mg every four hours.

## 2018-11-23 ENCOUNTER — Encounter: Payer: Self-pay | Admitting: Internal Medicine

## 2018-11-23 ENCOUNTER — Ambulatory Visit: Payer: 59 | Admitting: Internal Medicine

## 2018-11-23 DIAGNOSIS — R053 Chronic cough: Secondary | ICD-10-CM

## 2018-11-23 DIAGNOSIS — G43809 Other migraine, not intractable, without status migrainosus: Secondary | ICD-10-CM

## 2018-11-23 DIAGNOSIS — R05 Cough: Secondary | ICD-10-CM

## 2018-11-23 DIAGNOSIS — R739 Hyperglycemia, unspecified: Secondary | ICD-10-CM

## 2018-11-23 DIAGNOSIS — I10 Essential (primary) hypertension: Secondary | ICD-10-CM

## 2018-11-23 MED ORDER — HYDROCHLOROTHIAZIDE 12.5 MG PO CAPS: 12.5000 mg | ORAL_CAPSULE | Freq: Every day | ORAL | 2 refills | Status: DC

## 2018-11-23 MED FILL — HYDROCHLOROTHIAZIDE 12.5 MG: 12.5 | 30 days supply | Qty: 30 | Fill #0 | Status: TO

## 2018-11-23 NOTE — Progress Notes (Signed)
Patient ID: Ariel Perez, female   DOB: [birth year], 38 y.o.   MRN: [id number]   Subjective:    Patient ID: Ariel Perez, female    DOB: [birth year], 38 y.o.   MRN: [id number]  HPI  Patient here for her physical exam.  Sees gyn for breast and pelvic exams.  Sees Dr Orvan Seen - [facility].   Reports she is doing well.  Tries to stay active.  Discussed diet and exercise.  No chest pain.  No sob.  No acid reflux reported.  Some occasional cough.  Taking zyrtec.  No significant cough.  Blood pressure checks - 128/78.  Up today.  No abdominal pain.  Bowels stable. Work going well.     Past Medical History:  Diagnosis Date  . Headache   . Hypertension   . Migraines   . Osteoarthritis    Past Surgical History:  Procedure Laterality Date  . WISDOM TOOTH EXTRACTION N/A 2008   Approx x4    Family History  Problem Relation Age of Onset  . Cancer Father        unknown  . Diabetes Paternal Grandmother   . Hypertension Maternal Grandmother   . Rheum arthritis Maternal Grandmother   . Cancer Maternal Grandfather    Social History   Socioeconomic History  . Marital status: Single    Spouse name: Not on file  . Number of children: 0  . Years of education: Not on file  . Highest education level: Not on file  Occupational History  . Occupation: Financial controller primary care  Social Needs  . Financial resource strain: Not on file  . Food insecurity:    Worry: Not on file    Inability: Not on file  . Transportation needs:    Medical: Not on file    Non-medical: Not on file  Tobacco Use  . Smoking status: Never Smoker  . Smokeless tobacco: Never Used  Substance and Sexual Activity  . Alcohol use: Yes    Alcohol/week: 0.0 standard drinks    Comment: occ  . Drug use: No  . Sexual activity: Yes    Partners: Male    Comment: 1 partner  Lifestyle  . Physical activity:    Days per week: Not on file    Minutes per session: Not on file  . Stress: Not on file  Relationships  .  Social connections:    Talks on phone: Not on file    Gets together: Not on file    Attends religious service: Not on file    Active member of club or organization: Not on file    Attends meetings of clubs or organizations: Not on file    Relationship status: Not on file  Other Topics Concern  . Not on file  Social History Narrative   Works here at [employer] by herself    2 dogs live inside    Caffeine- Occasional    Right handed    Enjoys sleeping     Outpatient Encounter Medications as of 11/23/2018  Medication Sig  . hydrochlorothiazide (MICROZIDE) 12.5 MG capsule Take 1 capsule (12.5 mg total) by mouth daily.  . [DISCONTINUED] almotriptan (AXERT) 12.5 MG tablet Take 12.5 mg by mouth 2 (two) times daily.  . [DISCONTINUED] cyclobenzaprine (FLEXERIL) 10 MG tablet Take 1 tablet (10 mg total) by mouth 2 (two) times daily as needed for muscle spasms.  . [DISCONTINUED] hydrochlorothiazide (HYDRODIURIL) 25 MG tablet Take 1 tablet (25 mg total)  by mouth daily.  . [DISCONTINUED] levETIRAcetam (KEPPRA) 500 MG tablet Take 500 mg by mouth 2 (two) times daily.  . [DISCONTINUED] naproxen (NAPROSYN) 500 MG tablet Take 1 tablet (500 mg total) by mouth 2 (two) times daily.  . [DISCONTINUED] SUMAtriptan (IMITREX) 6 MG/0.5ML SOLN injection Inject 0.5 mLs (6 mg total) into the skin every 2 (two) hours as needed for migraine or headache (not to exceed 2 doses in 24 hours). May repeat in 2 hours if headache persists or recurs.  . [DISCONTINUED] SUMAtriptan Succinate (ONZETRA XSAIL) 11 MG/NOSEPC EXHP Place 1 Dose into both nostrils once. Administer 2 nosepieces (1 per nostril) at onset of migraine, may repeat dose after 2 hours. Do not exceed 2 doses in 24 hours.   No facility-administered encounter medications on file as of 11/23/2018.     Review of Systems  Constitutional: Negative for appetite change and unexpected weight change.  HENT: Negative for congestion and sinus pressure.   Eyes: Negative  for pain and visual disturbance.  Respiratory: Negative for chest tightness and shortness of breath.        Minimal cough.   Cardiovascular: Negative for chest pain, palpitations and leg swelling.  Gastrointestinal: Negative for abdominal pain, diarrhea, nausea and vomiting.  Genitourinary: Negative for difficulty urinating and dysuria.  Musculoskeletal: Negative for joint swelling and myalgias.  Skin: Negative for color change and rash.  Neurological: Negative for dizziness, light-headedness and headaches.  Hematological: Negative for adenopathy. Does not bruise/bleed easily.  Psychiatric/Behavioral: Negative for agitation and dysphoric mood.       Objective:    Physical Exam Constitutional:      General: She is not in acute distress.    Appearance: Normal appearance.  HENT:     Nose: Nose normal. No congestion.     Mouth/Throat:     Pharynx: No oropharyngeal exudate or posterior oropharyngeal erythema.  Neck:     Musculoskeletal: Neck supple. No muscular tenderness.     Thyroid: No thyromegaly.  Cardiovascular:     Rate and Rhythm: Normal rate and regular rhythm.  Pulmonary:     Effort: No respiratory distress.     Breath sounds: Normal breath sounds. No wheezing.  Abdominal:     General: Bowel sounds are normal.     Palpations: Abdomen is soft.     Tenderness: There is no abdominal tenderness.  Musculoskeletal:        General: No swelling or tenderness.  Lymphadenopathy:     Cervical: No cervical adenopathy.  Skin:    Findings: No erythema or rash.  Neurological:     Mental Status: She is alert.  Psychiatric:        Mood and Affect: Mood normal.        Behavior: Behavior normal.     BP 132/88   Pulse 80   Temp 98.7 F (37.1 C) (Oral)   Resp 16   Wt 239 lb 3.2 oz (108.5 kg)   SpO2 98%   BMI 39.80 kg/m  Wt Readings from Last 3 Encounters:  11/23/18 239 lb 3.2 oz (108.5 kg)  06/14/18 225 lb 6.4 oz (102.2 kg)  02/09/18 228 lb 3.2 oz (103.5 kg)     Lab  Results  Component Value Date   WBC 5.7 06/25/2017   HGB 13.4 06/25/2017   HCT 40.6 06/25/2017   PLT 261.0 06/25/2017   GLUCOSE 99 06/14/2018   CHOL 140 06/14/2018   TRIG 67.0 06/14/2018   HDL 45.20 06/14/2018   LDLCALC 82 06/14/2018  ALT 12 06/14/2018   AST 16 06/14/2018   NA 135 06/14/2018   K 3.5 06/14/2018   CL 98 06/14/2018   CREATININE 0.96 06/14/2018   BUN 9 06/14/2018   CO2 30 06/14/2018   TSH 1.41 06/25/2017   HGBA1C 6.0 06/14/2018       Assessment & Plan:   Problem List Items Addressed This Visit    Benign essential HTN    Blood pressure elevated.  Not taking any medication now.  hctz 12.71m q day.  Follow pressures.  Follow metabolic panel.       Relevant Medications   hydrochlorothiazide (MICROZIDE) 12.5 MG capsule   Other Relevant Orders   CBC with Differential/Platelet   Hepatic function panel   Lipid panel   TSH   Basic metabolic panel   Hyperglycemia    Low carb diet and exercise.  Follow met b and a1c.       Relevant Orders   Hemoglobin A1c   Migraine, unspecified, not intractable, without status migrainosus    Doing well.  No no medication.  Follow.       Relevant Medications   hydrochlorothiazide (MICROZIDE) 12.5 MG capsule   Persistent cough    CXR ok.  Taking zyrtec.  No significant cough.  Follow.            CEinar Pheasant MD

## 2018-11-27 ENCOUNTER — Encounter: Payer: Self-pay | Admitting: Internal Medicine

## 2018-11-27 DIAGNOSIS — R739 Hyperglycemia, unspecified: Secondary | ICD-10-CM | POA: Insufficient documentation

## 2018-11-27 NOTE — Assessment & Plan Note (Signed)
Doing well.  No no medication.  Follow.

## 2018-11-27 NOTE — Assessment & Plan Note (Signed)
Blood pressure elevated.  Not taking any medication now.  hctz 12.5mg  q day.  Follow pressures.  Follow metabolic panel.

## 2018-11-27 NOTE — Assessment & Plan Note (Signed)
Low carb diet and exercise.  Follow met b and a1c.  

## 2018-11-27 NOTE — Assessment & Plan Note (Signed)
CXR ok.  Taking zyrtec.  No significant cough.  Follow.

## 2018-12-15 MED FILL — HYDROCHLOROTHIAZIDE 12.5 MG: 12.5 | 30 days supply | Qty: 30 | Fill #0 | Status: TO

## 2019-02-21 MED FILL — HYDROCHLOROTHIAZIDE 12.5 MG: 12.5 | 30 days supply | Qty: 30 | Fill #0

## 2019-03-18 ENCOUNTER — Other Ambulatory Visit (INDEPENDENT_AMBULATORY_CARE_PROVIDER_SITE_OTHER): Payer: 59

## 2019-03-18 DIAGNOSIS — I10 Essential (primary) hypertension: Secondary | ICD-10-CM

## 2019-03-18 DIAGNOSIS — R739 Hyperglycemia, unspecified: Secondary | ICD-10-CM | POA: Diagnosis not present

## 2019-03-18 LAB — CBC WITH DIFFERENTIAL/PLATELET
Basophils Absolute: 0 10*3/uL (ref 0.0–0.1)
Basophils Relative: 0.7 % (ref 0.0–3.0)
Eosinophils Absolute: 0.6 10*3/uL (ref 0.0–0.7)
Eosinophils Relative: 8.8 % — ABNORMAL HIGH (ref 0.0–5.0)
HCT: 40.5 % (ref 36.0–46.0)
Hemoglobin: 13.6 g/dL (ref 12.0–15.0)
Lymphocytes Relative: 44.2 % (ref 12.0–46.0)
Lymphs Abs: 2.9 10*3/uL (ref 0.7–4.0)
MCHC: 33.5 g/dL (ref 30.0–36.0)
MCV: 89 fl (ref 78.0–100.0)
Monocytes Absolute: 0.4 10*3/uL (ref 0.1–1.0)
Monocytes Relative: 6.4 % (ref 3.0–12.0)
Neutro Abs: 2.6 10*3/uL (ref 1.4–7.7)
Neutrophils Relative %: 39.9 % — ABNORMAL LOW (ref 43.0–77.0)
Platelets: 267 10*3/uL (ref 150.0–400.0)
RBC: 4.56 Mil/uL (ref 3.87–5.11)
RDW: 14.5 % (ref 11.5–15.5)
WBC: 6.5 10*3/uL (ref 4.0–10.5)

## 2019-03-18 LAB — HEPATIC FUNCTION PANEL
ALT: 13 U/L (ref 0–35)
AST: 19 U/L (ref 0–37)
Albumin: 4.2 g/dL (ref 3.5–5.2)
Alkaline Phosphatase: 62 U/L (ref 39–117)
Bilirubin, Direct: 0.1 mg/dL (ref 0.0–0.3)
Total Bilirubin: 0.8 mg/dL (ref 0.2–1.2)
Total Protein: 7.7 g/dL (ref 6.0–8.3)

## 2019-03-18 LAB — BASIC METABOLIC PANEL
BUN: 8 mg/dL (ref 6–23)
CO2: 27 mEq/L (ref 19–32)
Calcium: 9.3 mg/dL (ref 8.4–10.5)
Chloride: 102 mEq/L (ref 96–112)
Creatinine, Ser: 0.95 mg/dL (ref 0.40–1.20)
GFR: 78.9 mL/min (ref >60.00)
Glucose, Bld: 97 mg/dL (ref 70–99)
Potassium: 3.7 mEq/L (ref 3.5–5.1)
Sodium: 137 mEq/L (ref 135–145)

## 2019-03-18 LAB — LIPID PANEL
Cholesterol: 128 mg/dL (ref 0–200)
HDL: 41.5 mg/dL (ref >39.00)
LDL Cholesterol: 73 mg/dL (ref 0–99)
NonHDL: 86.96
Total CHOL/HDL Ratio: 3
Triglycerides: 71 mg/dL (ref 0.0–149.0)
VLDL: 14.2 mg/dL (ref 0.0–40.0)

## 2019-03-18 LAB — HEMOGLOBIN A1C: Hgb A1c MFr Bld: 5.9 % (ref 4.6–6.5)

## 2019-03-18 LAB — TSH: TSH: 1.94 u[IU]/mL (ref 0.35–4.50)

## 2019-03-20 ENCOUNTER — Encounter: Payer: Self-pay | Admitting: Internal Medicine

## 2019-03-31 ENCOUNTER — Encounter: Payer: Self-pay | Admitting: Internal Medicine

## 2019-03-31 ENCOUNTER — Ambulatory Visit (INDEPENDENT_AMBULATORY_CARE_PROVIDER_SITE_OTHER): Payer: 59 | Admitting: Internal Medicine

## 2019-03-31 ENCOUNTER — Other Ambulatory Visit: Payer: Self-pay

## 2019-03-31 DIAGNOSIS — R739 Hyperglycemia, unspecified: Secondary | ICD-10-CM | POA: Diagnosis not present

## 2019-03-31 DIAGNOSIS — G43809 Other migraine, not intractable, without status migrainosus: Secondary | ICD-10-CM | POA: Diagnosis not present

## 2019-03-31 DIAGNOSIS — R05 Cough: Secondary | ICD-10-CM

## 2019-03-31 DIAGNOSIS — R053 Chronic cough: Secondary | ICD-10-CM

## 2019-03-31 DIAGNOSIS — I10 Essential (primary) hypertension: Secondary | ICD-10-CM | POA: Diagnosis not present

## 2019-03-31 MED ORDER — HYDROCHLOROTHIAZIDE 12.5 MG PO CAPS: 12.5000 mg | ORAL_CAPSULE | Freq: Every day | ORAL | 3 refills | Status: DC

## 2019-03-31 MED FILL — HYDROCHLOROTHIAZIDE 12.5 MG: 12.5 | 90 days supply | Qty: 90 | Fill #0

## 2019-03-31 NOTE — Progress Notes (Signed)
Patient ID: Ariel Perez, female   DOB: [birth year], 38 y.o.   MRN: [id number]   Virtual Visit via video Note  This visit type was conducted due to national recommendations for restrictions regarding the COVID-19 pandemic (e.g. social distancing).  This format is felt to be most appropriate for this patient at this time.  All issues noted in this document were discussed and addressed.  No physical exam was performed (except for noted visual exam findings with Video Visits).   I connected with Ariel Perez by a video enabled telemedicine application or telephone and verified that I am speaking with the correct person using two identifiers. Location patient: home Location provider: work  Persons participating in the virtual visit: patient, provider  I discussed the limitations, risks, security and privacy concerns of performing an evaluation and management service by video and the availability of in person appointments. The patient expressed understanding and agreed to proceed.   Reason for visit: scheduled follow up.   HPI: She reports she is doing well.  Feeling good.  Has adjusted her diet.  Doing weight watchers and intermittent fasting.  Lost weight.  Feels good.  Working in her yard.  Walking 2 miles per day.  Discussed recent labs.  No chest pain.  No sob.  No acid reflux.  No abdominal pain.  Trying to stay in due to covid restrictions.  No fever.  No cough or congestion.  No sob.  Some sinus drainage. Taking claritin and using saline nasal sprays.  Controlled.     ROS: See pertinent positives and negatives per HPI.  Past Medical History:  Diagnosis Date  . Headache   . Hypertension   . Migraines   . Osteoarthritis     Past Surgical History:  Procedure Laterality Date  . WISDOM TOOTH EXTRACTION N/A 2008   Approx x4     Family History  Problem Relation Age of Onset  . Cancer Father        unknown  . Diabetes Paternal Grandmother   . Hypertension Maternal  Grandmother   . Rheum arthritis Maternal Grandmother   . Cancer Maternal Grandfather     SOCIAL HX: reviewed.    Current Outpatient Medications:  .  hydrochlorothiazide (MICROZIDE) 12.5 MG capsule, Take 1 capsule (12.5 mg total) by mouth daily., Disp: 90 capsule, Rfl: 3  EXAM:  VITALS per patient if applicable: 323/55, 75, weight 224 pounds.   GENERAL: alert, oriented, appears well and in no acute distress  HEENT: atraumatic, conjunttiva clear, no obvious abnormalities on inspection of external nose and ears  NECK: normal movements of the head and neck  LUNGS: on inspection no signs of respiratory distress, breathing rate appears normal, no obvious gross SOB, gasping or wheezing  CV: no obvious cyanosis  PSYCH/NEURO: pleasant and cooperative, no obvious depression or anxiety, speech and thought processing grossly intact  ASSESSMENT AND PLAN:  Discussed the following assessment and plan:  Essential hypertension Blood pressure as outlined.  Follow pressures.  Follow metabolic panel.  Continue current medication.   Hyperglycemia Low carb diet and exercise.  Follow met b and a1c.   Migraine, unspecified, not intractable, without status migrainosus Doing well on no medication.  Follow.    Persistent cough No cough now.  Resolved.      I discussed the assessment and treatment plan with the patient. The patient was provided an opportunity to ask questions and all were answered. The patient agreed with the plan and demonstrated an understanding of the  instructions.   The patient was advised to call back or seek an in-person evaluation if the symptoms worsen or if the condition fails to improve as anticipated.   Einar Pheasant, MD

## 2019-04-03 ENCOUNTER — Encounter: Payer: Self-pay | Admitting: Internal Medicine

## 2019-04-03 NOTE — Assessment & Plan Note (Signed)
Doing well on no medication.  Follow.  

## 2019-04-03 NOTE — Assessment & Plan Note (Signed)
Low carb diet and exercise.  Follow met b and a1c.  

## 2019-04-03 NOTE — Assessment & Plan Note (Signed)
Blood pressure as outlined.  Follow pressures.  Follow metabolic panel.  Continue current medication.

## 2019-04-03 NOTE — Assessment & Plan Note (Signed)
No cough now.  Resolved.

## 2019-04-26 MED FILL — HYDROCHLOROTHIAZIDE 12.5 MG: 12.5 | 90 days supply | Qty: 90 | Fill #0

## 2019-06-03 DIAGNOSIS — H40013 Open angle with borderline findings, low risk, bilateral: Secondary | ICD-10-CM | POA: Diagnosis not present

## 2019-07-12 DIAGNOSIS — Z6836 Body mass index (BMI) 36.0-36.9, adult: Secondary | ICD-10-CM | POA: Diagnosis not present

## 2019-07-12 DIAGNOSIS — Z01419 Encounter for gynecological examination (general) (routine) without abnormal findings: Secondary | ICD-10-CM | POA: Diagnosis not present

## 2019-07-13 DIAGNOSIS — Z01419 Encounter for gynecological examination (general) (routine) without abnormal findings: Secondary | ICD-10-CM | POA: Diagnosis not present

## 2019-07-14 LAB — HM PAP SMEAR: HM Pap smear: NEGATIVE

## 2019-08-24 DIAGNOSIS — Z30431 Encounter for routine checking of intrauterine contraceptive device: Secondary | ICD-10-CM | POA: Diagnosis not present

## 2019-10-07 ENCOUNTER — Other Ambulatory Visit: Payer: Self-pay

## 2019-10-07 ENCOUNTER — Ambulatory Visit (INDEPENDENT_AMBULATORY_CARE_PROVIDER_SITE_OTHER): Payer: 59 | Admitting: Internal Medicine

## 2019-10-07 DIAGNOSIS — R739 Hyperglycemia, unspecified: Secondary | ICD-10-CM

## 2019-10-07 DIAGNOSIS — I10 Essential (primary) hypertension: Secondary | ICD-10-CM | POA: Diagnosis not present

## 2019-10-07 DIAGNOSIS — G43809 Other migraine, not intractable, without status migrainosus: Secondary | ICD-10-CM

## 2019-10-07 NOTE — Progress Notes (Signed)
Patient ID: Ariel Perez, female   DOB: May 13, 1981, 39 y.o.   MRN: [id number]   Virtual Visit via video Note  This visit type was conducted due to national recommendations for restrictions regarding the COVID-19 pandemic (e.g. social distancing).  This format is felt to be most appropriate for this patient at this time.  All issues noted in this document were discussed and addressed.  No physical exam was performed (except for noted visual exam findings with Video Visits).   I connected with Ariel Perez by a video enabled telemedicine application and verified that I am speaking with the correct person using two identifiers. Location patient: home Location provider: work Persons participating in the virtual visit: patient, provider  The limitations, risks, security and privacy concerns of performing an evaluation and management service by telephone and the availability of in person appointments have been discussed. The patient expressed understanding and agreed to proceed.   Reason for visit: scheduled follow up.   HPI: She reports she is doing relatively well.  Working from home.  Some increased stress related to this.  Overall handling things well.  Headaches are better.  Not a significant issue for her now.  No chest pain or sob reported.  Due to f/u with gyn next month.  Due to have IUD replaced.  Previous ultrasound - 2 fibroids.  No abdominal pain.  No bowel change reported.  Planning to start back doing yoga.  Meditates - 10 minutes/day.  Has mammogram scheduled 10/27/19.     ROS: See pertinent positives and negatives per HPI.  Past Medical History:  Diagnosis Date  . Headache   . Hypertension   . Migraines   . Osteoarthritis     Past Surgical History:  Procedure Laterality Date  . WISDOM TOOTH EXTRACTION N/A 2008   Approx x4     Family History  Problem Relation Age of Onset  . Cancer Father        unknown  . Diabetes Paternal Grandmother   . Hypertension Maternal  Grandmother   . Rheum arthritis Maternal Grandmother   . Cancer Maternal Grandfather     SOCIAL HX: reviewed.    Current Outpatient Medications:  .  hydrochlorothiazide (MICROZIDE) 12.5 MG capsule, Take 1 capsule (12.5 mg total) by mouth daily., Disp: 90 capsule, Rfl: 3  EXAM:  GENERAL: alert, oriented, appears well and in no acute distress  HEENT: atraumatic, conjunttiva clear, no obvious abnormalities on inspection of external nose and ears  NECK: normal movements of the head and neck  LUNGS: on inspection no signs of respiratory distress, breathing rate appears normal, no obvious gross SOB, gasping or wheezing  CV: no obvious cyanosis  PSYCH/NEURO: pleasant and cooperative, no obvious depression or anxiety, speech and thought processing grossly intact  ASSESSMENT AND PLAN:  Discussed the following assessment and plan:  Essential hypertension On hctz.  Have her spot check her pressure.  Follow pressures.  Follow metabolic panel.   Hyperglycemia Low carb diet and exercise.  Follow met b and a1c.   Migraine, unspecified, not intractable, without status migrainosus Doing well.  On no medication.     Orders Placed This Encounter  Procedures  . Hepatic function panel    Standing Status:   Future    Standing Expiration Date:   10/07/2020  . Hemoglobin A1c    Standing Status:   Future    Standing Expiration Date:   10/07/2020  . Lipid panel    Standing Status:   Future  Standing Expiration Date:   10/07/2020  . Basic metabolic panel (future)    Standing Status:   Future    Standing Expiration Date:   10/07/2020     I discussed the assessment and treatment plan with the patient. The patient was provided an opportunity to ask questions and all were answered. The patient agreed with the plan and demonstrated an understanding of the instructions.   The patient was advised to call back or seek an in-person evaluation if the symptoms worsen or if the condition fails to  improve as anticipated.   Einar Pheasant, MD

## 2019-10-08 ENCOUNTER — Encounter: Payer: Self-pay | Admitting: Internal Medicine

## 2019-10-08 NOTE — Assessment & Plan Note (Signed)
On hctz.  Have her spot check her pressure.  Follow pressures.  Follow metabolic panel.

## 2019-10-08 NOTE — Assessment & Plan Note (Signed)
Doing well.  On no medication.

## 2019-10-08 NOTE — Assessment & Plan Note (Signed)
Low carb diet and exercise.  Follow met b and a1c.  

## 2019-10-27 ENCOUNTER — Other Ambulatory Visit (INDEPENDENT_AMBULATORY_CARE_PROVIDER_SITE_OTHER): Payer: 59

## 2019-10-27 DIAGNOSIS — R739 Hyperglycemia, unspecified: Secondary | ICD-10-CM

## 2019-10-27 DIAGNOSIS — I10 Essential (primary) hypertension: Secondary | ICD-10-CM

## 2019-10-27 DIAGNOSIS — Z30433 Encounter for removal and reinsertion of intrauterine contraceptive device: Secondary | ICD-10-CM | POA: Diagnosis not present

## 2019-10-27 DIAGNOSIS — Z1231 Encounter for screening mammogram for malignant neoplasm of breast: Secondary | ICD-10-CM | POA: Diagnosis not present

## 2019-10-27 LAB — LIPID PANEL
Cholesterol: 138 mg/dL (ref 0–200)
HDL: 41.7 mg/dL (ref >39.00)
LDL Cholesterol: 81 mg/dL (ref 0–99)
NonHDL: 96.77
Total CHOL/HDL Ratio: 3
Triglycerides: 77 mg/dL (ref 0.0–149.0)
VLDL: 15.4 mg/dL (ref 0.0–40.0)

## 2019-10-27 LAB — BASIC METABOLIC PANEL
BUN: 9 mg/dL (ref 6–23)
CO2: 25 mEq/L (ref 19–32)
Calcium: 9.4 mg/dL (ref 8.4–10.5)
Chloride: 104 mEq/L (ref 96–112)
Creatinine, Ser: 0.93 mg/dL (ref 0.40–1.20)
GFR: 80.61 mL/min (ref >60.00)
Glucose, Bld: 101 mg/dL — ABNORMAL HIGH (ref 70–99)
Potassium: 3.8 mEq/L (ref 3.5–5.1)
Sodium: 137 mEq/L (ref 135–145)

## 2019-10-27 LAB — HEPATIC FUNCTION PANEL
ALT: 11 U/L (ref 0–35)
AST: 14 U/L (ref 0–37)
Albumin: 4.3 g/dL (ref 3.5–5.2)
Alkaline Phosphatase: 64 U/L (ref 39–117)
Bilirubin, Direct: 0.2 mg/dL (ref 0.0–0.3)
Total Bilirubin: 0.7 mg/dL (ref 0.2–1.2)
Total Protein: 8 g/dL (ref 6.0–8.3)

## 2019-10-27 LAB — HEMOGLOBIN A1C: Hgb A1c MFr Bld: 5.9 % (ref 4.6–6.5)

## 2019-10-27 MED FILL — DOXYCYCLINE HYCLATE 100 MG: 100 | 3 days supply | Qty: 6 | Fill #0

## 2019-10-31 ENCOUNTER — Other Ambulatory Visit: Payer: Self-pay | Admitting: Obstetrics and Gynecology

## 2019-10-31 DIAGNOSIS — R928 Other abnormal and inconclusive findings on diagnostic imaging of breast: Secondary | ICD-10-CM

## 2019-11-01 ENCOUNTER — Encounter: Payer: Self-pay | Admitting: Internal Medicine

## 2019-11-11 ENCOUNTER — Ambulatory Visit
Admission: RE | Admit: 2019-11-11 | Discharge: 2019-11-11 | Disposition: A | Discharge status: Home / Self Care | Payer: 59 | Source: Ambulatory Visit | Attending: Obstetrics and Gynecology | Admitting: Obstetrics and Gynecology

## 2019-11-11 ENCOUNTER — Other Ambulatory Visit: Payer: Self-pay

## 2019-11-11 ENCOUNTER — Other Ambulatory Visit: Payer: Self-pay | Admitting: Obstetrics and Gynecology

## 2019-11-11 DIAGNOSIS — R928 Other abnormal and inconclusive findings on diagnostic imaging of breast: Secondary | ICD-10-CM

## 2019-11-11 DIAGNOSIS — R599 Enlarged lymph nodes, unspecified: Secondary | ICD-10-CM

## 2019-11-11 DIAGNOSIS — R922 Inconclusive mammogram: Secondary | ICD-10-CM | POA: Diagnosis not present

## 2019-11-11 DIAGNOSIS — N63 Unspecified lump in unspecified breast: Secondary | ICD-10-CM

## 2019-11-11 DIAGNOSIS — N6489 Other specified disorders of breast: Secondary | ICD-10-CM | POA: Diagnosis not present

## 2019-11-11 DIAGNOSIS — N6320 Unspecified lump in the left breast, unspecified quadrant: Secondary | ICD-10-CM | POA: Diagnosis not present

## 2019-12-27 DIAGNOSIS — D259 Leiomyoma of uterus, unspecified: Secondary | ICD-10-CM | POA: Diagnosis not present

## 2019-12-27 DIAGNOSIS — Z30431 Encounter for routine checking of intrauterine contraceptive device: Secondary | ICD-10-CM | POA: Diagnosis not present

## 2020-01-02 MED FILL — AMOXICILLIN 500 MG CAPSULE: 500 | 10 days supply | Qty: 30 | Fill #0

## 2020-01-02 MED FILL — IBUPROFEN 800 MG TABS: 800 | 10 days supply | Qty: 30 | Fill #0

## 2020-02-09 ENCOUNTER — Other Ambulatory Visit: Payer: Self-pay

## 2020-02-09 ENCOUNTER — Ambulatory Visit
Admission: RE | Admit: 2020-02-09 | Discharge: 2020-02-09 | Disposition: A | Discharge status: Home / Self Care | Payer: 59 | Source: Ambulatory Visit | Attending: Obstetrics and Gynecology | Admitting: Obstetrics and Gynecology

## 2020-02-09 ENCOUNTER — Other Ambulatory Visit: Payer: Self-pay | Admitting: Obstetrics and Gynecology

## 2020-02-09 DIAGNOSIS — R599 Enlarged lymph nodes, unspecified: Secondary | ICD-10-CM

## 2020-02-09 DIAGNOSIS — N6489 Other specified disorders of breast: Secondary | ICD-10-CM | POA: Diagnosis not present

## 2020-04-16 ENCOUNTER — Other Ambulatory Visit: Payer: Self-pay

## 2020-04-19 ENCOUNTER — Encounter: Payer: Self-pay | Admitting: Internal Medicine

## 2020-04-19 ENCOUNTER — Ambulatory Visit: Payer: 59 | Admitting: Internal Medicine

## 2020-04-19 ENCOUNTER — Other Ambulatory Visit: Payer: Self-pay

## 2020-04-19 VITALS — BP 136/78 | HR 80 | Temp 98.7°F | Resp 16 | Ht 65.0 in | Wt 226.0 lb

## 2020-04-19 DIAGNOSIS — R739 Hyperglycemia, unspecified: Secondary | ICD-10-CM | POA: Diagnosis not present

## 2020-04-19 DIAGNOSIS — I10 Essential (primary) hypertension: Secondary | ICD-10-CM | POA: Diagnosis not present

## 2020-04-19 DIAGNOSIS — G43809 Other migraine, not intractable, without status migrainosus: Secondary | ICD-10-CM

## 2020-04-19 LAB — CBC WITH DIFFERENTIAL/PLATELET
Basophils Absolute: 0.1 10*3/uL (ref 0.0–0.1)
Basophils Relative: 0.9 % (ref 0.0–3.0)
Eosinophils Absolute: 0.4 10*3/uL (ref 0.0–0.7)
Eosinophils Relative: 6.6 % — ABNORMAL HIGH (ref 0.0–5.0)
HCT: 41.5 % (ref 36.0–46.0)
Hemoglobin: 13.8 g/dL (ref 12.0–15.0)
Lymphocytes Relative: 38.5 % (ref 12.0–46.0)
Lymphs Abs: 2.3 10*3/uL (ref 0.7–4.0)
MCHC: 33.2 g/dL (ref 30.0–36.0)
MCV: 90.5 fl (ref 78.0–100.0)
Monocytes Absolute: 0.4 10*3/uL (ref 0.1–1.0)
Monocytes Relative: 6.6 % (ref 3.0–12.0)
Neutro Abs: 2.8 10*3/uL (ref 1.4–7.7)
Neutrophils Relative %: 47.4 % (ref 43.0–77.0)
Platelets: 249 10*3/uL (ref 150.0–400.0)
RBC: 4.59 Mil/uL (ref 3.87–5.11)
RDW: 14.2 % (ref 11.5–15.5)
WBC: 5.9 10*3/uL (ref 4.0–10.5)

## 2020-04-19 LAB — COMPREHENSIVE METABOLIC PANEL
ALT: 11 U/L (ref 0–35)
AST: 15 U/L (ref 0–37)
Albumin: 4.4 g/dL (ref 3.5–5.2)
Alkaline Phosphatase: 61 U/L (ref 39–117)
BUN: 8 mg/dL (ref 6–23)
CO2: 27 mEq/L (ref 19–32)
Calcium: 9.8 mg/dL (ref 8.4–10.5)
Chloride: 103 mEq/L (ref 96–112)
Creatinine, Ser: 0.98 mg/dL (ref 0.40–1.20)
GFR: 75.7 mL/min (ref >60.00)
Glucose, Bld: 94 mg/dL (ref 70–99)
Potassium: 4.1 mEq/L (ref 3.5–5.1)
Sodium: 135 mEq/L (ref 135–145)
Total Bilirubin: 0.8 mg/dL (ref 0.2–1.2)
Total Protein: 7.7 g/dL (ref 6.0–8.3)

## 2020-04-19 LAB — LIPID PANEL
Cholesterol: 141 mg/dL (ref 0–200)
HDL: 42.9 mg/dL (ref >39.00)
LDL Cholesterol: 85 mg/dL (ref 0–99)
NonHDL: 97.91
Total CHOL/HDL Ratio: 3
Triglycerides: 64 mg/dL (ref 0.0–149.0)
VLDL: 12.8 mg/dL (ref 0.0–40.0)

## 2020-04-19 LAB — TSH: TSH: 1.18 u[IU]/mL (ref 0.35–4.50)

## 2020-04-19 LAB — HEMOGLOBIN A1C: Hgb A1c MFr Bld: 5.9 % (ref 4.6–6.5)

## 2020-04-19 NOTE — Progress Notes (Signed)
Patient ID: Ariel Perez, female   DOB: Apr 07, 1981, 39 y.o.   MRN: [id number]   Subjective:    Patient ID: Ariel Perez, female    DOB: 02-Feb-1981, 39 y.o.   MRN: [id number]  HPI This visit occurred during the SARS-CoV-2 public health emergency.  Safety protocols were in place, including screening questions prior to the visit, additional usage of staff PPE, and extensive cleaning of exam room while observing appropriate contact time as indicated for disinfecting solutions.  Patient here for a scheduled follow up.  She is doing well. Work going well.  Working from home.  Weight is down.  No chest pain or sob reported.  Discussed diet and exercise.  No abdominal pain.  Bowels moving.  Headaches - much improved.  Not a significant issue for her now.  Overall doing well.  Blood pressures doing well - averaging 120s/70s.  No acid reflux.    Past Medical History:  Diagnosis Date   Headache    Hypertension    Migraines    Osteoarthritis    Past Surgical History:  Procedure Laterality Date   WISDOM TOOTH EXTRACTION N/A 2008   Approx x4    Family History  Problem Relation Age of Onset   Cancer Father        unknown   Diabetes Paternal Grandmother    Hypertension Maternal Grandmother    Rheum arthritis Maternal Grandmother    Cancer Maternal Grandfather    Social History   Socioeconomic History   Marital status: Single    Spouse name: Not on file   Number of children: 0   Years of education: Not on file   Highest education level: Not on file  Occupational History   Occupation: Financial controller primary care  Tobacco Use   Smoking status: Never Smoker   Smokeless tobacco: Never Used  Substance and Sexual Activity   Alcohol use: Yes    Alcohol/week: 0.0 standard drinks    Comment: occ   Drug use: No   Sexual activity: Yes    Partners: Male    Comment: 1 partner  Other Topics Concern   Not on file  Social History Narrative   Works here at [employer] by herself    2 dogs live inside    Caffeine- Occasional    Right handed    Enjoys sleeping    Social Determinants of Health   Financial Resource Strain:    Difficulty of Paying Living Expenses:   Food Insecurity:    Worried About Charity fundraiser in the Last Year:    Arboriculturist in the Last Year:   Transportation Needs:    Film/video editor (Medical):    Lack of Transportation (Non-Medical):   Physical Activity:    Days of Exercise per Week:    Minutes of Exercise per Session:   Stress:    Feeling of Stress :   Social Connections:    Frequency of Communication with Friends and Family:    Frequency of Social Gatherings with Friends and Family:    Attends Religious Services:    Active Member of Clubs or Organizations:    Attends Archivist Meetings:    Marital Status:     Outpatient Encounter Medications as of 04/19/2020  Medication Sig   hydrochlorothiazide (MICROZIDE) 12.5 MG capsule Take 1 capsule (12.5 mg total) by mouth daily.   levonorgestrel (MIRENA, 52 MG,) 20 MCG/24HR IUD Mirena 20 mcg/24 hours (6 yrs) 6  mg intrauterine device  Take 1 device by intrauterine route.   No facility-administered encounter medications on file as of 04/19/2020.    Review of Systems  Constitutional: Negative for appetite change and unexpected weight change.  HENT: Negative for congestion and sinus pressure.   Respiratory: Negative for cough, chest tightness and shortness of breath.   Cardiovascular: Negative for chest pain, palpitations and leg swelling.  Gastrointestinal: Negative for abdominal pain, diarrhea, nausea and vomiting.  Genitourinary: Negative for difficulty urinating and dysuria.  Musculoskeletal: Negative for joint swelling and myalgias.  Skin: Negative for color change and rash.  Neurological: Negative for dizziness, light-headedness and headaches.  Psychiatric/Behavioral: Negative for agitation and dysphoric mood.         Objective:    Physical Exam Vitals reviewed.  Constitutional:      General: She is not in acute distress.    Appearance: Normal appearance.  HENT:     Head: Normocephalic and atraumatic.     Right Ear: External ear normal.     Left Ear: External ear normal.  Eyes:     General: No scleral icterus.       Right eye: No discharge.        Left eye: No discharge.     Conjunctiva/sclera: Conjunctivae normal.  Neck:     Thyroid: No thyromegaly.  Cardiovascular:     Rate and Rhythm: Normal rate and regular rhythm.  Pulmonary:     Effort: No respiratory distress.     Breath sounds: Normal breath sounds. No wheezing.  Abdominal:     General: Bowel sounds are normal.     Palpations: Abdomen is soft.     Tenderness: There is no abdominal tenderness.  Musculoskeletal:        General: No swelling or tenderness.     Cervical back: Neck supple. No tenderness.  Lymphadenopathy:     Cervical: No cervical adenopathy.  Skin:    Findings: No erythema or rash.  Neurological:     Mental Status: She is alert.  Psychiatric:        Mood and Affect: Mood normal.        Behavior: Behavior normal.     BP (!) 136/78    Pulse 80    Temp 98.7 F (37.1 C)    Resp 16    Ht 5' 5"  (1.651 m)    Wt (!) 226 lb (102.5 kg)    SpO2 99%    BMI 37.61 kg/m  Wt Readings from Last 3 Encounters:  04/19/20 (!) 226 lb (102.5 kg)  10/07/19 239 lb (108.4 kg)  11/23/18 239 lb 3.2 oz (108.5 kg)     Lab Results  Component Value Date   WBC 5.9 04/19/2020   HGB 13.8 04/19/2020   HCT 41.5 04/19/2020   PLT 249.0 04/19/2020   GLUCOSE 94 04/19/2020   CHOL 141 04/19/2020   TRIG 64.0 04/19/2020   HDL 42.90 04/19/2020   LDLCALC 85 04/19/2020   ALT 11 04/19/2020   AST 15 04/19/2020   NA 135 04/19/2020   K 4.1 04/19/2020   CL 103 04/19/2020   CREATININE 0.98 04/19/2020   BUN 8 04/19/2020   CO2 27 04/19/2020   TSH 1.18 04/19/2020   HGBA1C 5.9 04/19/2020    [place] AXILLA LEFT  Result Date: 02/09/2020 CLINICAL  DATA:  39 year old female presenting for short-term follow-up of probably benign lymph nodes in the bilateral axilla. EXAM: ULTRASOUND OF THE BILATERAL BREAST COMPARISON:  Previous exam(s). FINDINGS: Right axilla:  Targeted ultrasound is performed showing multiple normal lymph nodes with retained fatty hila and normal cortical thickness measuring up to 0.3 cm. These have decreased in size compared to the prior study. No suspicious lymph node identified. Left axilla: Targeted ultrasound is performed showing multiple normal lymph nodes with retained fatty hila and normal cortical thickness measuring up to 0.3 cm. These have decreased in size compared to the prior study. No suspicious lymph node identified. IMPRESSION: Normal bilateral axillary lymph nodes. RECOMMENDATION: Patient is scheduled for follow-up of a probably benign finding in the left breast in August 2021. I have discussed the findings and recommendations with the patient. If applicable, a reminder letter will be sent to the patient regarding the next appointment. BI-RADS CATEGORY  2: Benign. Electronically Signed   By: Audie Pinto M.D.   On: 02/09/2020 10:30   [place] AXILLA RIGHT  Result Date: 02/09/2020 CLINICAL DATA:  39 year old female presenting for short-term follow-up of probably benign lymph nodes in the bilateral axilla. EXAM: ULTRASOUND OF THE BILATERAL BREAST COMPARISON:  Previous exam(s). FINDINGS: Right axilla: Targeted ultrasound is performed showing multiple normal lymph nodes with retained fatty hila and normal cortical thickness measuring up to 0.3 cm. These have decreased in size compared to the prior study. No suspicious lymph node identified. Left axilla: Targeted ultrasound is performed showing multiple normal lymph nodes with retained fatty hila and normal cortical thickness measuring up to 0.3 cm. These have decreased in size compared to the prior study. No suspicious lymph node identified. IMPRESSION: Normal bilateral axillary  lymph nodes. RECOMMENDATION: Patient is scheduled for follow-up of a probably benign finding in the left breast in August 2021. I have discussed the findings and recommendations with the patient. If applicable, a reminder letter will be sent to the patient regarding the next appointment. BI-RADS CATEGORY  2: Benign. Electronically Signed   By: Audie Pinto M.D.   On: 02/09/2020 10:30       Assessment & Plan:   Problem List Items Addressed This Visit    Essential hypertension    On hctz.  Follow pressures.  Follow metabolic panel.       Relevant Orders   CBC with Differential/Platelet (Completed)   Comprehensive metabolic panel (Completed)   Lipid panel (Completed)   TSH (Completed)   Hyperglycemia - Primary    Low carb diet and exercise.  Follow met b and a1c.       Relevant Orders   Hemoglobin A1c (Completed)   Migraine, unspecified, not intractable, without status migrainosus    Doing well.  On no medication.  Follow.           Einar Pheasant, MD

## 2020-04-22 ENCOUNTER — Encounter: Payer: Self-pay | Admitting: Internal Medicine

## 2020-04-22 NOTE — Assessment & Plan Note (Signed)
Low carb diet and exercise.  Follow met b and a1c.  

## 2020-04-22 NOTE — Assessment & Plan Note (Signed)
On hctz.  Follow pressures.  Follow metabolic panel.

## 2020-04-22 NOTE — Assessment & Plan Note (Signed)
Doing well.  On no medication.  Follow.

## 2020-05-11 ENCOUNTER — Other Ambulatory Visit: Payer: Self-pay | Admitting: Obstetrics and Gynecology

## 2020-05-11 ENCOUNTER — Other Ambulatory Visit: Payer: Self-pay

## 2020-05-11 ENCOUNTER — Ambulatory Visit
Admission: RE | Admit: 2020-05-11 | Discharge: 2020-05-11 | Disposition: A | Discharge status: Home / Self Care | Payer: 59 | Source: Ambulatory Visit | Attending: Obstetrics and Gynecology | Admitting: Obstetrics and Gynecology

## 2020-05-11 DIAGNOSIS — N63 Unspecified lump in unspecified breast: Secondary | ICD-10-CM

## 2020-05-11 DIAGNOSIS — R922 Inconclusive mammogram: Secondary | ICD-10-CM | POA: Diagnosis not present

## 2020-05-13 IMAGING — MG DIGITAL DIAGNOSTIC BILAT W/ TOMO W/ CAD
6 of 10 series · 6 of 30 positions shown · non-contrast
Comparison: Previous exam(s).

CLINICAL DATA: Possible asymmetry in the medial right breast in the
craniocaudal projection, possible mass in the posterior central left
breast in the oblique projection and possible bilateral enlarged
axillary lymph nodes on a recent baseline screening mammogram.The
patient received the 1st dose of a [id number] vaccine in the right arm
on 10/10/2019 and the 2nd dose in the left arm on 10/31/2019.

EXAM:
DIGITAL DIAGNOSTIC BILATERAL MAMMOGRAM WITH CAD AND TOMO
ULTRASOUND LEFT BREAST
ULTRASOUND RIGHT AXILLA

[R ML synth-2D]
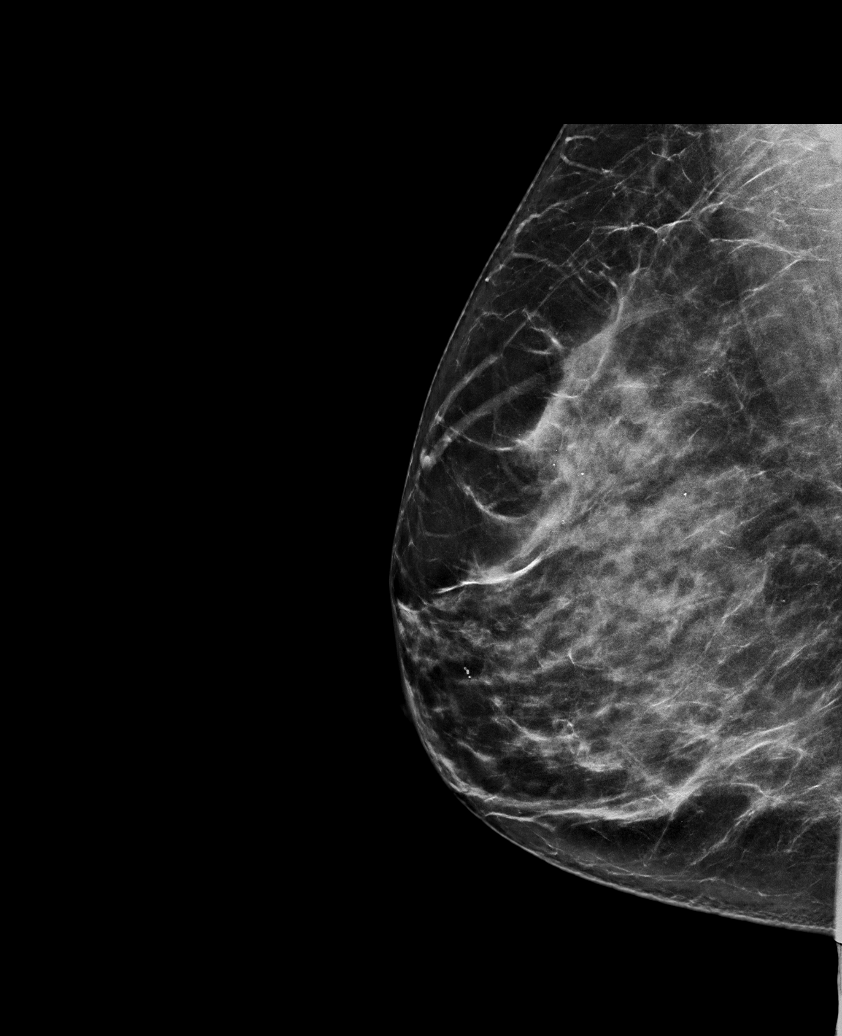

[L ML synth-2D]
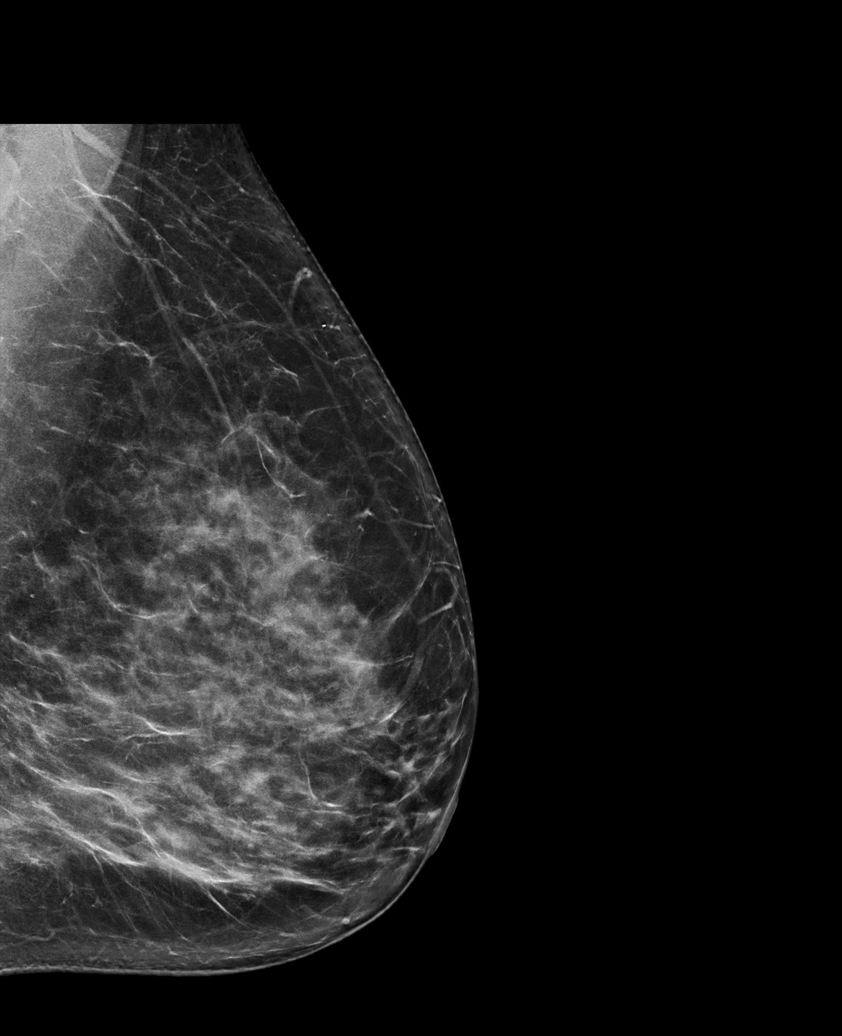

[L MLO synth-2D (1 of 2)]
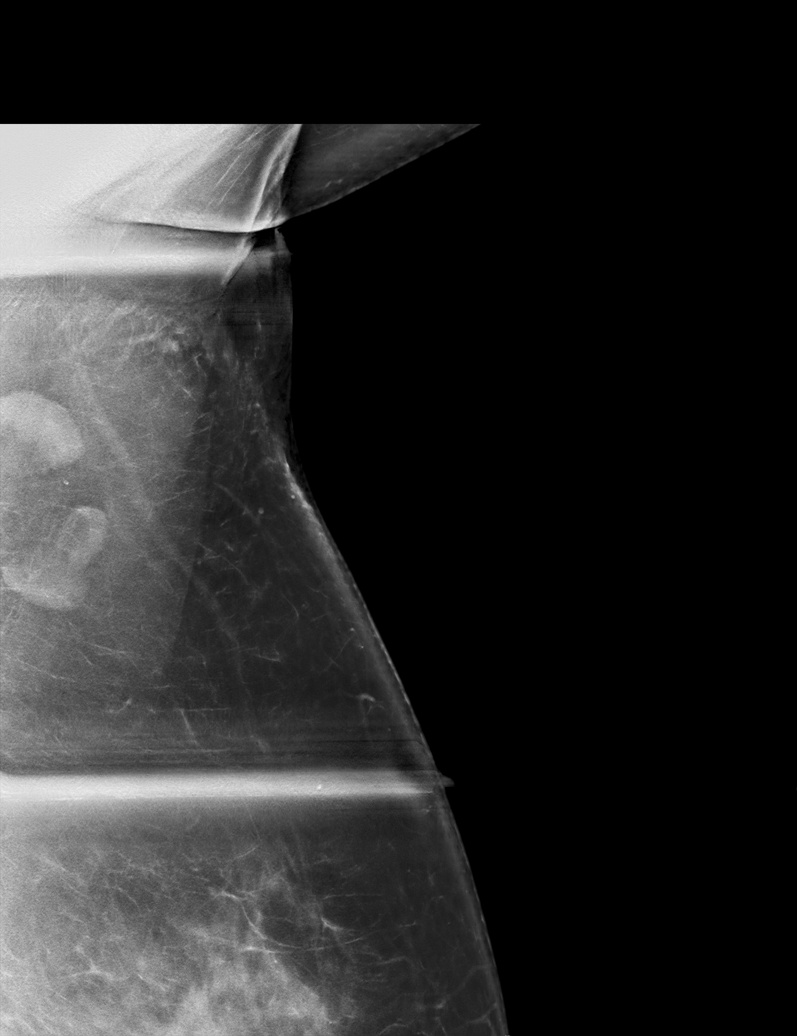

[R CC synth-2D]
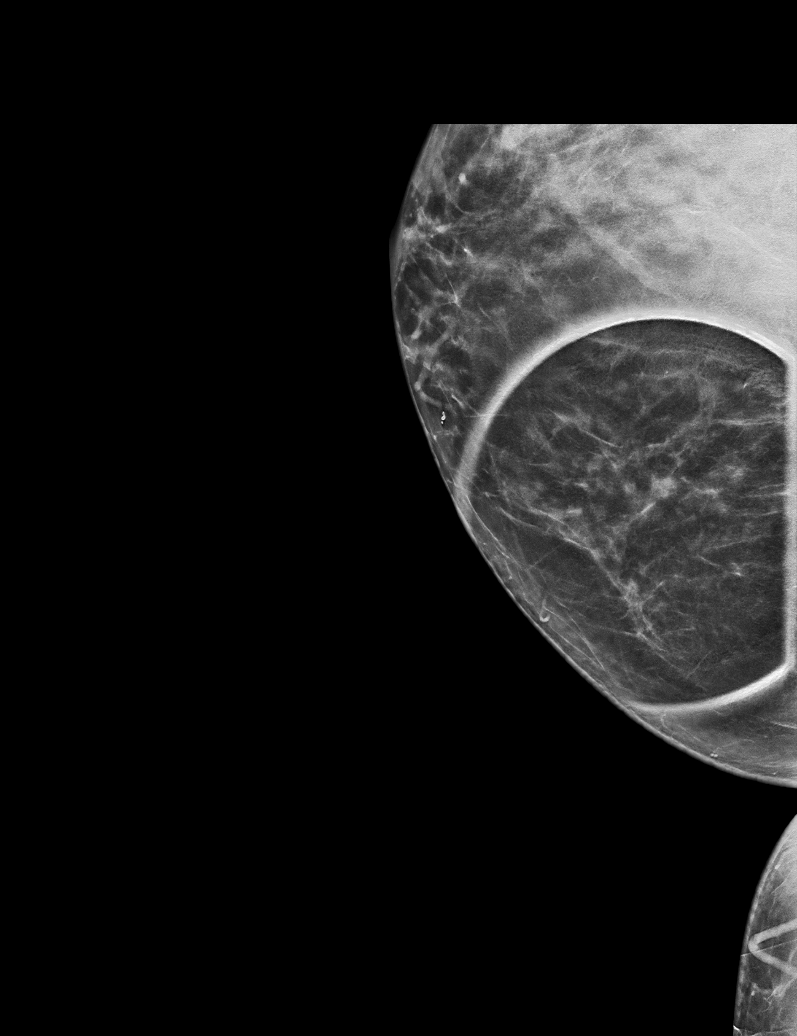

[L MLO synth-2D (2 of 2)]
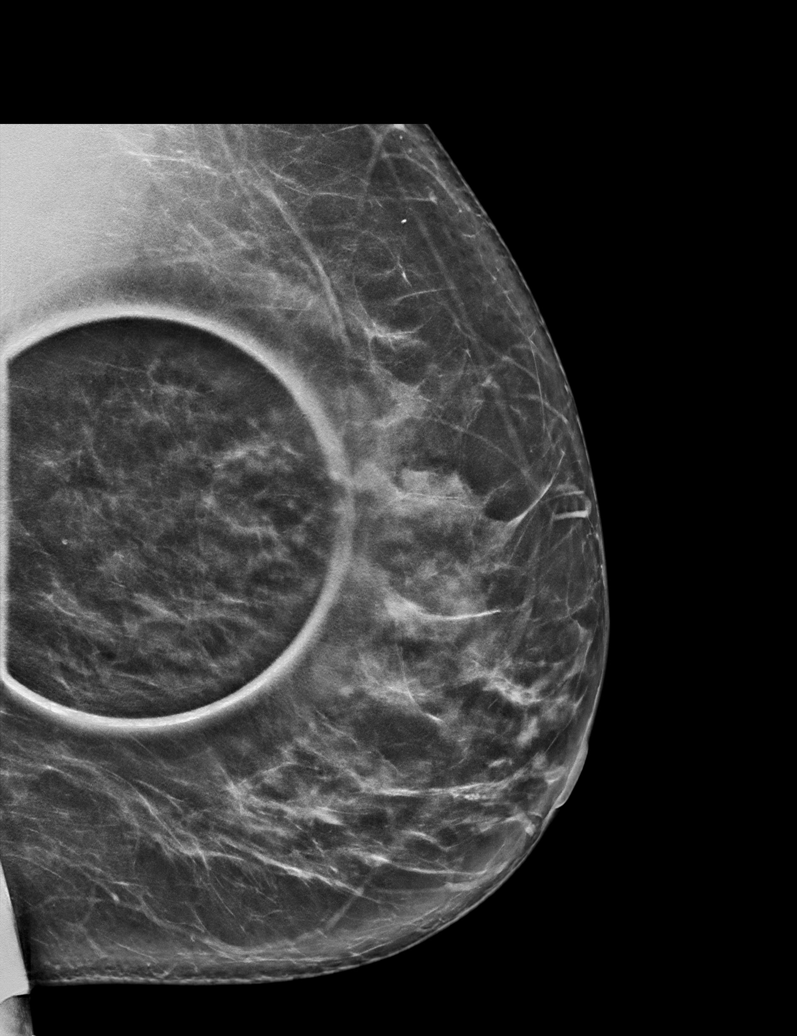

[L MLO tomo · tomo slice 45/88.0]
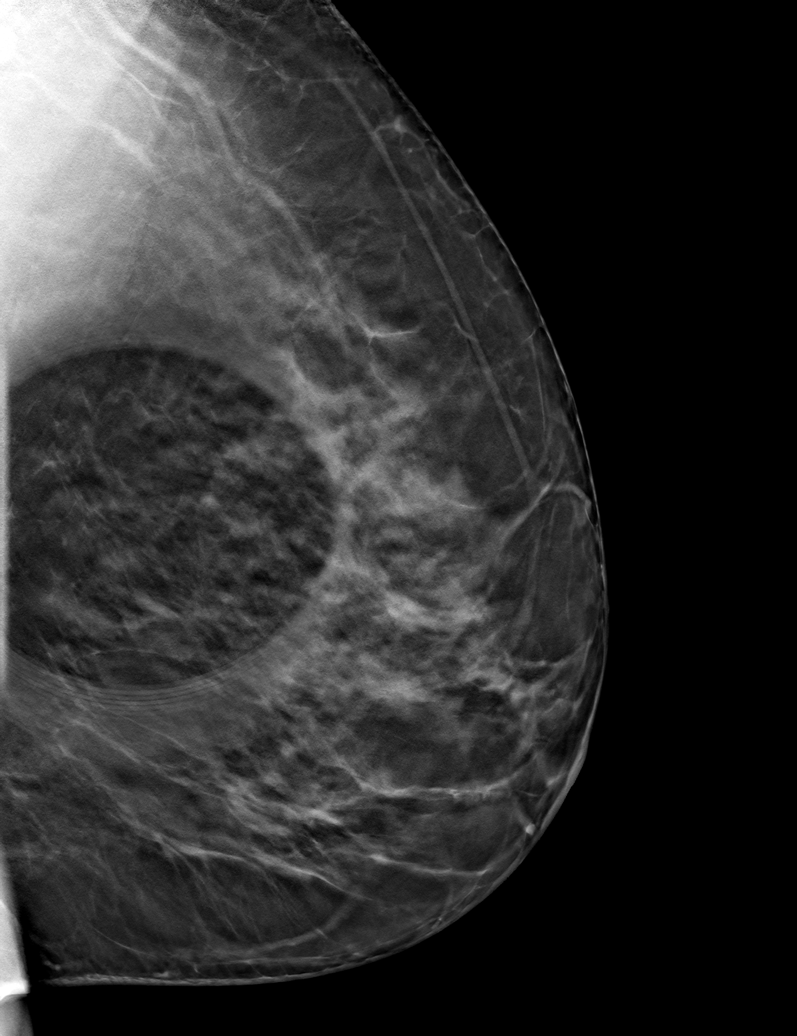

[6 of 30 positions shown; findings below may reference images not displayed]

ACR Breast Density Category c: The breast tissue is heterogeneously
dense, which may obscure small masses.
FINDINGS: 3D tomographic and 2D generated true lateral and spot compression
craniocaudal views of the right breast demonstrate normal appearing
fibroglandular tissue at the location of recently suspected
asymmetry.

3D tomographic and 2D generated true lateral and spot compression
oblique views of the left breast confirm a 1.7 cm oval,
circumscribed mass in the posterior aspect of the central portion of
the breast. This is low density mammographically and somewhat
difficult to see.

There are symmetrically prominent bilateral axillary lymph nodes
with normal fatty hila.

Mammographic images were processed with CAD.

On physical exam, there are no palpable axillary or left breast
masses.

Targeted ultrasound is performed, showing normal appearing dense
breast tissue throughout the central portions of the left breast
with no sonographically visible mass posteriorly.

There are mildly prominent bilateral axillary lymph nodes with mild
diffuse cortical thickening and normal fatty hila. The maximum
cortical thickness on the left is 5.0 mm in the maximum cortical
thickness on the right is 4.6 mm.
IMPRESSION: 1. 1.7 cm probably benign mass in the posterior central left breast
with no sonographic correlate.
2. Multiple bilateral axillary lymph nodes with mild diffuse
cortical thickening. These most likely represent reactive nodes.

RECOMMENDATION:
1. Bilateral axillary ultrasound in 3 months.
2. Left 3D diagnostic mammogram in 6 months.

I have discussed the findings and recommendations with the patient.
If applicable, a reminder letter will be sent to the patient
regarding the next appointment.

BI-RADS CATEGORY  3: Probably benign.

## 2020-08-17 ENCOUNTER — Other Ambulatory Visit (HOSPITAL_COMMUNITY): Payer: Self-pay | Admitting: Family Medicine

## 2020-08-17 ENCOUNTER — Other Ambulatory Visit: Payer: Self-pay

## 2020-08-17 ENCOUNTER — Encounter (HOSPITAL_COMMUNITY): Payer: Self-pay

## 2020-08-17 ENCOUNTER — Ambulatory Visit (HOSPITAL_COMMUNITY)
Admission: EM | Admit: 2020-08-17 | Discharge: 2020-08-17 | Disposition: A | Discharge status: Home / Self Care | Payer: 59 | Attending: Family Medicine | Admitting: Family Medicine

## 2020-08-17 DIAGNOSIS — J22 Unspecified acute lower respiratory infection: Secondary | ICD-10-CM | POA: Diagnosis not present

## 2020-08-17 MED ORDER — ALBUTEROL SULFATE HFA 108 (90 BASE) MCG/ACT IN AERS
INHALATION_SPRAY | RESPIRATORY_TRACT | Status: AC
  Filled 2020-08-17: qty 6.7

## 2020-08-17 MED ORDER — AMOXICILLIN-POT CLAVULANATE 875-125 MG PO TABS: 1.0000 | ORAL_TABLET | Freq: Two times a day (BID) | ORAL | 0 refills | Status: DC

## 2020-08-17 MED ORDER — ALBUTEROL SULFATE HFA 108 (90 BASE) MCG/ACT IN AERS: 1.0000 | INHALATION_SPRAY | Freq: Four times a day (QID) | RESPIRATORY_TRACT | 0 refills | Status: DC | PRN

## 2020-08-17 MED ORDER — PREDNISONE 20 MG PO TABS: 20.0000 mg | ORAL_TABLET | Freq: Two times a day (BID) | ORAL | 0 refills | Status: DC

## 2020-08-17 MED ORDER — AEROCHAMBER PLUS FLO-VU LARGE MISC
Status: AC
  Filled 2020-08-17: qty 1

## 2020-08-17 MED ORDER — BENZONATATE 200 MG PO CAPS: 200.0000 mg | ORAL_CAPSULE | Freq: Two times a day (BID) | ORAL | 0 refills | Status: DC | PRN

## 2020-08-17 MED ORDER — ALBUTEROL SULFATE HFA 108 (90 BASE) MCG/ACT IN AERS
2.0000 | INHALATION_SPRAY | Freq: Once | RESPIRATORY_TRACT | Status: AC
  Administered 2020-08-17: 2 via RESPIRATORY_TRACT

## 2020-08-17 MED ORDER — AEROCHAMBER PLUS FLO-VU LARGE MISC
1.0000 | Freq: Once | Status: AC
  Administered 2020-08-17: 1

## 2020-08-17 MED FILL — AMOX-CLAV 875-125 MG TABLET: 875-125 | 7 days supply | Qty: 14 | Fill #0

## 2020-08-17 MED FILL — predniSONE 20 MG TABS: 20 | 5 days supply | Qty: 10 | Fill #0

## 2020-08-17 MED FILL — BENZONATATE 200 MG CAPS: 200 | 10 days supply | Qty: 20 | Fill #0

## 2020-08-17 MED FILL — ALBUTEROL SULFATE HFA 108 (: 108 (90 BAS | 25 days supply | Qty: 18 | Fill #0

## 2020-08-17 NOTE — Discharge Instructions (Signed)
Continue to push fluids Take the prednisone and augmentin 2 x a day  Take with food Take 2 doses today Tessalon is for cough, take 2-3 a day Use inhaler as needed.  May choose to hold on picking up at pharmacy until the clinic inhaler runs out See your PCP if you fail to improve

## 2020-08-17 NOTE — ED Provider Notes (Signed)
[facility]    CSN: [id number] Arrival date & time: 08/17/20  0175      History   Chief Complaint Chief Complaint  Patient presents with  . Cough    x 2 weeks  . Nasal Congestion    x 2 weeks    HPI Ariel Perez is a 39 y.o. female.   HPI Patient is here with a cough for 2 weeks.  Sputum production.  She has sinus drainage and postnasal drip.  Some sore throat.  No fever chills.  Some fatigue.  She is having wheezing.  The wheezing is keeping her awake at night.  She has never had asthma or wheezing previously.  She is a non-smoker.  She has been vaccinated for Covid. Past Medical History:  Diagnosis Date  . Headache   . Hypertension   . Migraines   . Osteoarthritis     Patient Active Problem List   Diagnosis Date Noted  . Hyperglycemia 11/27/2018  . Persistent cough 06/14/2018  . Intractable migraine without aura and with status migrainosus 01/28/2016  . Nausea without vomiting 01/28/2016  . Allergic reaction 12/14/2015  . Snoring 11/16/2015  . Acute confusional migraine 11/16/2015  . Intractable migraine with aura with status migrainosus 11/16/2015  . Sleep related headaches 11/16/2015  . BMI 37.0-37.9, adult 11/16/2015  . Benign essential HTN 07/15/2015  . Chronic migraine without aura without status migrainosus, not intractable 05/09/2015  . Thoracic back pain 04/10/2015  . Hyponatremia 04/10/2015  . Synovitis of knee 02/20/2013  . Pap smear abnormality of cervix/human papillomavirus (HPV) positive 01/20/2012  . Pain in joint 11/19/2011  . Screening for diabetes mellitus 10/09/2011  . Cluster headache syndrome 10/09/2011  . 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 Essential hypertension 10/09/2011  . Migraine, unspecified, not intractable, without status migrainosus 10/09/2011  . Pain in joint involving lower leg 10/09/2011    Past Surgical History:  Procedure Laterality Date  . WISDOM TOOTH EXTRACTION N/A 2008   Approx x4     OB  History   No obstetric history on file.      Home Medications    Prior to Admission medications   Medication Sig Start Date End Date Taking? Authorizing Provider  levonorgestrel (MIRENA, 52 MG,) 20 MCG/24HR IUD Mirena 20 mcg/24 hours (6 yrs) 52 mg intrauterine device  Take 1 device by intrauterine route.   Yes [provider]  albuterol (VENTOLIN HFA) 108 (90 Base) MCG/ACT inhaler Inhale 1-2 puffs into the lungs every 6 (six) hours as needed for wheezing or shortness of breath. 08/17/20   Raylene Everts, MD  amoxicillin-clavulanate (AUGMENTIN) 875-125 MG tablet Take 1 tablet by mouth every 12 (twelve) hours. 08/17/20   Raylene Everts, MD  benzonatate (TESSALON) 200 MG capsule Take 1 capsule (200 mg total) by mouth 2 (two) times daily as needed for cough. 08/17/20   Raylene Everts, MD  hydrochlorothiazide (MICROZIDE) 12.5 MG capsule Take 1 capsule (12.5 mg total) by mouth daily. 03/31/19   Einar Pheasant, MD  predniSONE (DELTASONE) 20 MG tablet Take 1 tablet (20 mg total) by mouth 2 (two) times daily with a meal. 08/17/20   Raylene Everts, MD    Family History Family History  Problem Relation Age of Onset  . Cancer Father        unknown  . Diabetes Paternal Grandmother   . Hypertension Maternal Grandmother   . Rheum arthritis Maternal Grandmother   . Cancer Maternal Grandfather     Social  History Social History   Tobacco Use  . Smoking status: Never Smoker  . Smokeless tobacco: Never Used  Vaping Use  . Vaping Use: Never used  Substance Use Topics  . Alcohol use: Yes    Alcohol/week: 0.0 standard drinks    Comment: occ  . Drug use: No     Allergies   Patient has no known allergies.   Review of Systems Review of Systems  See HPI Physical Exam Triage Vital Signs ED Triage Vitals  Enc Vitals Group     BP 08/17/20 0818 (!) 145/95     Pulse Rate 08/17/20 0818 83     Resp 08/17/20 0818 18     Temp 08/17/20 0818 99 F (37.2 C)     Temp  Source 08/17/20 0818 Oral     SpO2 08/17/20 0818 98 %     Weight --      Height --      Head Circumference --      Peak Flow --      Pain Score 08/17/20 0820 0     Pain Loc --      Pain Edu? --      Excl. in [place]? --    No data found.  Updated Vital Signs BP (!) 145/95 (BP Location: Left Arm)   Pulse 83   Temp 99 F (37.2 C) (Oral)   Resp 18   SpO2 98%      Physical Exam Constitutional:      General: She is not in acute distress.    Appearance: She is well-developed.  HENT:     Head: Normocephalic and atraumatic.     Right Ear: Tympanic membrane, ear canal and external ear normal.     Left Ear: Tympanic membrane, ear canal and external ear normal.     Nose: Nose normal. No congestion.     Mouth/Throat:     Mouth: Mucous membranes are moist.  Eyes:     Conjunctiva/sclera: Conjunctivae normal.     Pupils: Pupils are equal, round, and reactive to light.  Cardiovascular:     Rate and Rhythm: Normal rate.  Pulmonary:     Effort: Pulmonary effort is normal. No respiratory distress.     Breath sounds: Wheezing present.     Comments: Scattered wheeze throughout both lungs.  Improved after albuterol Abdominal:     General: There is no distension.     Palpations: Abdomen is soft.  Musculoskeletal:        General: Normal range of motion.     Cervical back: Normal range of motion.  Skin:    General: Skin is warm and dry.  Neurological:     Mental Status: She is alert.  Psychiatric:        Behavior: Behavior normal.      UC Treatments / Results  Labs (all labs ordered are listed, but only abnormal results are displayed) Labs Reviewed - No data to display  EKG   Radiology No results found.  Procedures Procedures (including critical care time)  Medications Ordered in UC Medications  albuterol (VENTOLIN HFA) 108 (90 Base) MCG/ACT inhaler 2 puff (has no administration in time range)  AeroChamber Plus Flo-Vu Large MISC 1 each (has no administration in time  range)    Initial Impression / Assessment and Plan / UC Course  I have reviewed the triage vital signs and the nursing notes.  Pertinent labs & imaging results that were available during my care of the patient were  reviewed by me and considered in my medical decision making (see chart for details).     Final Clinical Impressions(s) / UC Diagnoses   Final diagnoses:  LRTI (lower respiratory tract infection)     Discharge Instructions     Continue to push fluids Take the prednisone and augmentin 2 x a day  Take with food Take 2 doses today Tessalon is for cough, take 2-3 a day Use inhaler as needed.  May choose to hold on picking up at pharmacy until the clinic inhaler runs out See your PCP if you fail to improve   ED Prescriptions    Medication Sig Dispense Auth. Provider   benzonatate (TESSALON) 200 MG capsule Take 1 capsule (200 mg total) by mouth 2 (two) times daily as needed for cough. 20 capsule Raylene Everts, MD   amoxicillin-clavulanate (AUGMENTIN) 875-125 MG tablet Take 1 tablet by mouth every 12 (twelve) hours. 14 tablet Raylene Everts, MD   predniSONE (DELTASONE) 20 MG tablet Take 1 tablet (20 mg total) by mouth 2 (two) times daily with a meal. 10 tablet Raylene Everts, MD   albuterol (VENTOLIN HFA) 108 (90 Base) MCG/ACT inhaler Inhale 1-2 puffs into the lungs every 6 (six) hours as needed for wheezing or shortness of breath. 18 g Raylene Everts, MD     PDMP not reviewed this encounter.   Raylene Everts, MD 08/17/20 [phone]

## 2020-08-17 NOTE — ED Triage Notes (Signed)
Pt states she has had cough, congestion and wheezing beginning 2 weeks ago and it is unimproved with otc meds. Pt is aox4 and ambulatory.

## 2020-09-11 ENCOUNTER — Ambulatory Visit: Payer: 59 | Attending: Internal Medicine

## 2020-09-11 ENCOUNTER — Other Ambulatory Visit: Payer: Self-pay | Admitting: Internal Medicine

## 2020-09-11 DIAGNOSIS — Z23 Encounter for immunization: Secondary | ICD-10-CM

## 2020-09-11 NOTE — Progress Notes (Signed)
   Covid-19 Vaccination Clinic  Name:  Ariel Perez    MRN: [id number] DOB: 13-Nov-1980  09/11/2020  Ariel Perez was observed post Covid-19 immunization for 15 minutes without incident. She was provided with Vaccine Information Sheet and instruction to access the V-Safe system.   Ariel Perez was instructed to call 911 with any severe reactions post vaccine: Marland Kitchen Difficulty breathing  . Swelling of face and throat  . A fast heartbeat  . A bad rash all over body  . Dizziness and weakness   Immunizations Administered    Name Date Dose VIS Date Route   Pfizer COVID-19 Vaccine 09/11/2020  9:59 AM 0.3 mL 07/11/2020 Intramuscular   Manufacturer: [place]   Lot: [id number]   [place]: [id number]

## 2020-09-24 ENCOUNTER — Other Ambulatory Visit: Payer: 59

## 2020-09-24 DIAGNOSIS — Z20822 Contact with and (suspected) exposure to covid-19: Secondary | ICD-10-CM | POA: Diagnosis not present

## 2020-09-25 LAB — NOVEL CORONAVIRUS, NAA: SARS-CoV-2, NAA: NOT DETECTED

## 2020-09-25 LAB — SARS-COV-2, NAA 2 DAY TAT

## 2020-09-28 DIAGNOSIS — Z01419 Encounter for gynecological examination (general) (routine) without abnormal findings: Secondary | ICD-10-CM | POA: Diagnosis not present

## 2020-09-28 DIAGNOSIS — Z6838 Body mass index (BMI) 38.0-38.9, adult: Secondary | ICD-10-CM | POA: Diagnosis not present

## 2020-10-25 ENCOUNTER — Encounter: Payer: Self-pay | Admitting: Internal Medicine

## 2020-10-25 ENCOUNTER — Other Ambulatory Visit: Payer: Self-pay | Admitting: Internal Medicine

## 2020-10-25 ENCOUNTER — Other Ambulatory Visit: Payer: Self-pay

## 2020-10-25 ENCOUNTER — Ambulatory Visit: Payer: 59 | Admitting: Internal Medicine

## 2020-10-25 ENCOUNTER — Ambulatory Visit (INDEPENDENT_AMBULATORY_CARE_PROVIDER_SITE_OTHER): Payer: 59

## 2020-10-25 DIAGNOSIS — I10 Essential (primary) hypertension: Secondary | ICD-10-CM | POA: Diagnosis not present

## 2020-10-25 DIAGNOSIS — D219 Benign neoplasm of connective and other soft tissue, unspecified: Secondary | ICD-10-CM | POA: Diagnosis not present

## 2020-10-25 DIAGNOSIS — R062 Wheezing: Secondary | ICD-10-CM | POA: Diagnosis not present

## 2020-10-25 DIAGNOSIS — R739 Hyperglycemia, unspecified: Secondary | ICD-10-CM

## 2020-10-25 DIAGNOSIS — G43809 Other migraine, not intractable, without status migrainosus: Secondary | ICD-10-CM | POA: Diagnosis not present

## 2020-10-25 MED ORDER — LEVALBUTEROL TARTRATE 45 MCG/ACT IN AERO: 2.0000 | INHALATION_SPRAY | Freq: Four times a day (QID) | RESPIRATORY_TRACT | 1 refills | Status: DC | PRN

## 2020-10-25 MED ORDER — PREDNISONE 10 MG PO TABS: ORAL_TABLET | ORAL | 0 refills | Status: DC

## 2020-10-25 MED FILL — LEVALBUTEROL TAR HFA 45MCG: 45 | 25 days supply | Qty: 15 | Fill #0

## 2020-10-25 MED FILL — HYDROCHLOROTHIAZIDE 12.5 MG: 12.5 | 90 days supply | Qty: 90 | Fill #0

## 2020-10-25 MED FILL — predniSONE 10 MG TABS: 10 | 12 days supply | Qty: 39 | Fill #0

## 2020-10-25 NOTE — Progress Notes (Signed)
Patient ID: Ariel Perez, female   DOB: 27-Apr-1981, 40 y.o.   MRN: [id number]   Subjective:    Patient ID: Ariel Perez, female    DOB: 11-17-1980, 40 y.o.   MRN: [id number]  HPI This visit occurred during the SARS-CoV-2 public health emergency.  Safety protocols were in place, including screening questions prior to the visit, additional usage of staff PPE, and extensive cleaning of exam room while observing appropriate contact time as indicated for disinfecting solutions.  Patient here for a scheduled follow up.  Here to follow up regarding her blood pressure. Was seen 08/17/20 (urgent care) for cough, congestion and wheezing.  Prescribed prednisone, augmentin and tessalon perles.  Appears to have improved some, but symptoms continue.  Was informed by her gyn - still with wheezing.  Was checked 09/24/20 - negative covid swab.  No fever.  No nasal congestion or sinus pressure.  Persistent wheezing.  No nausea or vomiting.  Bowels moving.  Using humidifier.  Also has albuterol inhaler.  Makes her a little jittery.  Has noticed some sob with exertion, but relates this to not exercising.    Past Medical History:  Diagnosis Date  . Headache   . Hypertension   . Migraines   . Osteoarthritis    Past Surgical History:  Procedure Laterality Date  . WISDOM TOOTH EXTRACTION N/A 2008   Approx x4    Family History  Problem Relation Age of Onset  . Cancer Father        unknown  . Diabetes Paternal Grandmother   . Hypertension Maternal Grandmother   . Rheum arthritis Maternal Grandmother   . Cancer Maternal Grandfather    Social History   Socioeconomic History  . Marital status: Single    Spouse name: Not on file  . Number of children: 0  . Years of education: Not on file  . Highest education level: Not on file  Occupational History  . Occupation: Financial controller primary care  Tobacco Use  . Smoking status: Never Smoker  . Smokeless tobacco: Never Used  Vaping Use  . Vaping Use:  Never used  Substance and Sexual Activity  . Alcohol use: Yes    Alcohol/week: 0.0 standard drinks    Comment: occ  . Drug use: No  . Sexual activity: Yes    Partners: Male    Comment: 1 partner  Other Topics Concern  . Not on file  Social History Narrative   Works here at [employer] by herself    2 dogs live inside    Caffeine- Occasional    Right handed    Enjoys sleeping    Social Determinants of Health   Financial Resource Strain: Not on file  Food Insecurity: Not on file  Transportation Needs: Not on file  Physical Activity: Not on file  Stress: Not on file  Social Connections: Not on file    Outpatient Encounter Medications as of 10/25/2020  Medication Sig  . levalbuterol (XOPENEX HFA) 45 MCG/ACT inhaler Inhale 2 puffs into the lungs every 6 (six) hours as needed for wheezing.  . predniSONE (DELTASONE) 10 MG tablet Take 6 tablets x 1 day and then decrease by 1/2 tablet per day until down to zero mg..  . albuterol (VENTOLIN HFA) 108 (90 Base) MCG/ACT inhaler Inhale 1-2 puffs into the lungs every 6 (six) hours as needed for wheezing or shortness of breath.  . benzonatate (TESSALON) 200 MG capsule Take 1 capsule (200 mg total) by mouth 2 (  two) times daily as needed for cough.  Marland Kitchen levonorgestrel (MIRENA, 52 MG,) 20 MCG/24HR IUD Mirena 20 mcg/24 hours (6 yrs) 52 mg intrauterine device  Take 1 device by intrauterine route.  . [DISCONTINUED] amoxicillin-clavulanate (AUGMENTIN) 875-125 MG tablet Take 1 tablet by mouth every 12 (twelve) hours.  . [DISCONTINUED] hydrochlorothiazide (MICROZIDE) 12.5 MG capsule Take 1 capsule (12.5 mg total) by mouth daily.  . [DISCONTINUED] predniSONE (DELTASONE) 20 MG tablet Take 1 tablet (20 mg total) by mouth 2 (two) times daily with a meal.   No facility-administered encounter medications on file as of 10/25/2020.    Review of Systems  Constitutional: Negative for appetite change and unexpected weight change.  HENT: Negative for  congestion and sinus pressure.   Respiratory: Positive for wheezing. Negative for cough and chest tightness.        SOB with exertion.   Cardiovascular: Negative for chest pain, palpitations and leg swelling.  Gastrointestinal: Negative for abdominal pain, diarrhea, nausea and vomiting.  Genitourinary: Negative for difficulty urinating and dysuria.  Musculoskeletal: Negative for joint swelling and myalgias.  Skin: Negative for color change and rash.  Neurological: Negative for dizziness, light-headedness and headaches.  Psychiatric/Behavioral: Negative for agitation and dysphoric mood.       Objective:    Physical Exam Vitals reviewed.  Constitutional:      General: She is not in acute distress.    Appearance: Normal appearance.  HENT:     Head: Normocephalic and atraumatic.     Right Ear: External ear normal.     Left Ear: External ear normal.     Mouth/Throat:     Mouth: Oropharynx is clear and moist.  Eyes:     General: No scleral icterus.       Right eye: No discharge.        Left eye: No discharge.     Conjunctiva/sclera: Conjunctivae normal.  Neck:     Thyroid: No thyromegaly.  Cardiovascular:     Rate and Rhythm: Normal rate and regular rhythm.  Pulmonary:     Effort: No respiratory distress.     Breath sounds: Wheezing present.     Comments: No increased cough with forced expiration.  Abdominal:     General: Bowel sounds are normal.     Palpations: Abdomen is soft.     Tenderness: There is no abdominal tenderness.  Musculoskeletal:        General: No swelling, tenderness or edema.     Cervical back: Neck supple. No tenderness.  Lymphadenopathy:     Cervical: No cervical adenopathy.  Skin:    Findings: No erythema or rash.  Neurological:     Mental Status: She is alert.  Psychiatric:        Mood and Affect: Mood normal.        Behavior: Behavior normal.     BP 128/70   Pulse 88   Temp 98.2 F (36.8 C) (Oral)   Resp 16   Ht 5' 5"  (1.651 m)   Wt  229 lb (103.9 kg)   SpO2 98%   BMI 38.11 kg/m  Wt Readings from Last 3 Encounters:  10/25/20 229 lb (103.9 kg)  04/19/20 (!) 226 lb (102.5 kg)  10/07/19 239 lb (108.4 kg)     Lab Results  Component Value Date   WBC 5.9 04/19/2020   HGB 13.8 04/19/2020   HCT 41.5 04/19/2020   PLT 249.0 04/19/2020   GLUCOSE 94 04/19/2020   CHOL 141 04/19/2020   TRIG  64.0 04/19/2020   HDL 42.90 04/19/2020   LDLCALC 85 04/19/2020   ALT 11 04/19/2020   AST 15 04/19/2020   NA 135 04/19/2020   K 4.1 04/19/2020   CL 103 04/19/2020   CREATININE 0.98 04/19/2020   BUN 8 04/19/2020   CO2 27 04/19/2020   TSH 1.18 04/19/2020   HGBA1C 5.9 04/19/2020      Assessment & Plan:   Problem List Items Addressed This Visit    Essential hypertension    On hctz.  Blood pressure doing well.  Follow pressures.  Follow metabolic panel.       Fibroids    Saw Dr Julien Girt gyn. Pelvic ultrasound - two fibroids.  F/u ultrasound 11/2020.  GYN - pelvic and pap smears.       Hyperglycemia    Low carb diet and exercise.  Follow met b and a1c.       Migraine, unspecified, not intractable, without status migrainosus    Doing well.  Follow.       Wheezing    Persistent wheezing as outlined.  Does not appear to have acute infection.  Continue humidifier.  Change albuterol inhaler to xopenex - given side effects of albuterol.  Hold abx.  Prednisone taper as directed.  No new exposures or known triggers. Check cxr.  If persistent, will require further evaluation and testing (for example PFTs, pulmonary referral, etc).       Relevant Orders   DG Chest 2 View (Completed)       Einar Pheasant, MD

## 2020-10-27 ENCOUNTER — Encounter: Payer: Self-pay | Admitting: Internal Medicine

## 2020-10-27 DIAGNOSIS — D219 Benign neoplasm of connective and other soft tissue, unspecified: Secondary | ICD-10-CM | POA: Insufficient documentation

## 2020-10-27 NOTE — Assessment & Plan Note (Signed)
Low carb diet and exercise.  Follow met b and a1c.  

## 2020-10-27 NOTE — Assessment & Plan Note (Signed)
On hctz.  Blood pressure doing well.  Follow pressures.  Follow metabolic panel.

## 2020-10-27 NOTE — Assessment & Plan Note (Signed)
Persistent wheezing as outlined.  Does not appear to have acute infection.  Continue humidifier.  Change albuterol inhaler to xopenex - given side effects of albuterol.  Hold abx.  Prednisone taper as directed.  No new exposures or known triggers. Check cxr.  If persistent, will require further evaluation and testing (for example PFTs, pulmonary referral, etc).

## 2020-10-27 NOTE — Assessment & Plan Note (Signed)
Doing well.  Follow.  

## 2020-10-27 NOTE — Assessment & Plan Note (Signed)
Saw Dr Julien Girt gyn. Pelvic ultrasound - two fibroids.  F/u ultrasound 11/2020.  GYN - pelvic and pap smears.

## 2020-11-12 ENCOUNTER — Ambulatory Visit
Admission: RE | Admit: 2020-11-12 | Discharge: 2020-11-12 | Disposition: A | Discharge status: Home / Self Care | Payer: 59 | Source: Ambulatory Visit | Attending: Obstetrics and Gynecology | Admitting: Obstetrics and Gynecology

## 2020-11-12 ENCOUNTER — Other Ambulatory Visit: Payer: Self-pay | Admitting: Obstetrics and Gynecology

## 2020-11-12 ENCOUNTER — Other Ambulatory Visit: Payer: Self-pay

## 2020-11-12 DIAGNOSIS — N63 Unspecified lump in unspecified breast: Secondary | ICD-10-CM

## 2020-11-12 DIAGNOSIS — N6001 Solitary cyst of right breast: Secondary | ICD-10-CM | POA: Diagnosis not present

## 2020-11-12 DIAGNOSIS — R922 Inconclusive mammogram: Secondary | ICD-10-CM | POA: Diagnosis not present

## 2020-12-14 DIAGNOSIS — D252 Subserosal leiomyoma of uterus: Secondary | ICD-10-CM | POA: Diagnosis not present

## 2021-02-22 ENCOUNTER — Other Ambulatory Visit (HOSPITAL_COMMUNITY): Payer: Self-pay

## 2021-02-22 ENCOUNTER — Other Ambulatory Visit: Payer: Self-pay

## 2021-02-22 ENCOUNTER — Ambulatory Visit: Payer: 59 | Admitting: Internal Medicine

## 2021-02-22 ENCOUNTER — Encounter: Payer: Self-pay | Admitting: Internal Medicine

## 2021-02-22 VITALS — BP 132/94 | HR 68 | Temp 98.3°F | Ht 65.0 in | Wt 234.0 lb

## 2021-02-22 DIAGNOSIS — R928 Other abnormal and inconclusive findings on diagnostic imaging of breast: Secondary | ICD-10-CM

## 2021-02-22 DIAGNOSIS — R062 Wheezing: Secondary | ICD-10-CM | POA: Diagnosis not present

## 2021-02-22 DIAGNOSIS — I10 Essential (primary) hypertension: Secondary | ICD-10-CM | POA: Diagnosis not present

## 2021-02-22 DIAGNOSIS — G43809 Other migraine, not intractable, without status migrainosus: Secondary | ICD-10-CM

## 2021-02-22 DIAGNOSIS — R739 Hyperglycemia, unspecified: Secondary | ICD-10-CM

## 2021-02-22 LAB — CBC WITH DIFFERENTIAL/PLATELET
Basophils Absolute: 0.1 10*3/uL (ref 0.0–0.1)
Basophils Relative: 1 % (ref 0.0–3.0)
Eosinophils Absolute: 0.5 10*3/uL (ref 0.0–0.7)
Eosinophils Relative: 7.9 % — ABNORMAL HIGH (ref 0.0–5.0)
HCT: 42.6 % (ref 36.0–46.0)
Hemoglobin: 14.2 g/dL (ref 12.0–15.0)
Lymphocytes Relative: 42.1 % (ref 12.0–46.0)
Lymphs Abs: 2.4 10*3/uL (ref 0.7–4.0)
MCHC: 33.3 g/dL (ref 30.0–36.0)
MCV: 90.6 fl (ref 78.0–100.0)
Monocytes Absolute: 0.3 10*3/uL (ref 0.1–1.0)
Monocytes Relative: 5.2 % (ref 3.0–12.0)
Neutro Abs: 2.5 10*3/uL (ref 1.4–7.7)
Neutrophils Relative %: 43.8 % (ref 43.0–77.0)
Platelets: 260 10*3/uL (ref 150.0–400.0)
RBC: 4.7 Mil/uL (ref 3.87–5.11)
RDW: 14.2 % (ref 11.5–15.5)
WBC: 5.7 10*3/uL (ref 4.0–10.5)

## 2021-02-22 LAB — HEMOGLOBIN A1C: Hgb A1c MFr Bld: 5.9 % (ref 4.6–6.5)

## 2021-02-22 LAB — TSH: TSH: 1.83 u[IU]/mL (ref 0.35–4.50)

## 2021-02-22 MED ORDER — PREDNISONE 10 MG PO TABS
ORAL_TABLET | ORAL | 0 refills | Status: DC
  Filled 2021-02-22: qty 39, 11d supply, fill #0
  Filled 2021-02-22: qty 39, 12d supply, fill #0

## 2021-02-22 NOTE — Progress Notes (Signed)
Patient ID: Ariel Perez, female   DOB: 1981/02/23, 40 y.o.   MRN: [id number]   Subjective:    Patient ID: Ariel Perez Overall, female    DOB: September 19, 1981, 40 y.o.   MRN: [id number]  HPI This visit occurred during the SARS-CoV-2 public health emergency.  Safety protocols were in place, including screening questions prior to the visit, additional usage of staff PPE, and extensive cleaning of exam room while observing appropriate contact time as indicated for disinfecting solutions.  Patient here for a scheduled follow up. Here to follow up regarding her blood pressure.  She reports blood pressure has been ok - 841L systolic.  No chest pain reported.  Is still noticing wheezing.  Resolved with previous prednisone.  Returned - no known exposures.  Some sob with exertion/wheezing.  No increased heart rate or palpitations.  No acid reflux.  No abdominal pain.  Bowels moving.  Recent cxr - ok. Sees GYN for pelvic and pap smears.   Past Medical History:  Diagnosis Date  . Headache   . Hypertension   . Migraines   . Osteoarthritis    Past Surgical History:  Procedure Laterality Date  . WISDOM TOOTH EXTRACTION N/A 2008   Approx x4    Family History  Problem Relation Age of Onset  . Cancer Father        unknown  . Diabetes Paternal Grandmother   . Hypertension Maternal Grandmother   . Rheum arthritis Maternal Grandmother   . Cancer Maternal Grandfather    Social History   Socioeconomic History  . Marital status: Single    Spouse name: Not on file  . Number of children: 0  . Years of education: Not on file  . Highest education level: Not on file  Occupational History  . Occupation: Financial controller primary care  Tobacco Use  . Smoking status: Never Smoker  . Smokeless tobacco: Never Used  Vaping Use  . Vaping Use: Never used  Substance and Sexual Activity  . Alcohol use: Yes    Alcohol/week: 0.0 standard drinks    Comment: occ  . Drug use: No  . Sexual activity: Yes    Partners:  Male    Comment: 1 partner  Other Topics Concern  . Not on file  Social History Narrative   Works here at [employer] by herself    2 dogs live inside    Caffeine- Occasional    Right handed    Enjoys sleeping    Social Determinants of Health   Financial Resource Strain: Not on file  Food Insecurity: Not on file  Transportation Needs: Not on file  Physical Activity: Not on file  Stress: Not on file  Social Connections: Not on file    Outpatient Encounter Medications as of 02/22/2021  Medication Sig  . albuterol (VENTOLIN HFA) 108 (90 Base) MCG/ACT inhaler INHALE 1-2 PUFFS INTO THE LUNGS EVERY 6 (SIX) HOURS AS NEEDED FOR WHEEZING OR SHORTNESS OF BREATH.  . benzonatate (TESSALON) 200 MG capsule TAKE 1 CAPSULE (200 MG TOTAL) BY MOUTH 2 (TWO) TIMES DAILY AS NEEDED FOR COUGH.  Marland Kitchen COVID-19 mRNA vaccine, Pfizer, 30 MCG/0.3ML injection USE AS DIRECTED  . hydrochlorothiazide (MICROZIDE) 12.5 MG capsule TAKE 1 CAPSULE (12.5 MG TOTAL) BY MOUTH DAILY.  Marland Kitchen levalbuterol (XOPENEX HFA) 45 MCG/ACT inhaler INHALE 2 PUFFS INTO THE LUNGS EVERY 6 (SIX) HOURS AS NEEDED FOR WHEEZING.  Marland Kitchen levonorgestrel (MIRENA, 52 MG,) 20 MCG/24HR IUD Mirena 20 mcg/24 hours (6 yrs) 52 mg intrauterine  device  Take 1 device by intrauterine route.  . [DISCONTINUED] predniSONE (DELTASONE) 10 MG tablet TAKE 6 TABLETS X 1 DAY AND THEN DECREASE BY 1/2 TABLET PER DAY UNTIL DOWN TO ZERO MG..  . predniSONE (DELTASONE) 10 MG tablet Take 6 tablet by mouth on day 1 then decrease by 1/2 tablet every day until it is gone.   No facility-administered encounter medications on file as of 02/22/2021.    Review of Systems  Constitutional: Negative for appetite change and unexpected weight change.  HENT: Negative for congestion and sinus pressure.   Respiratory: Positive for shortness of breath and wheezing. Negative for cough and chest tightness.   Cardiovascular: Negative for chest pain, palpitations and leg swelling.  Gastrointestinal:  Negative for abdominal pain, diarrhea, nausea and vomiting.  Genitourinary: Negative for difficulty urinating and dysuria.  Musculoskeletal: Negative for joint swelling and myalgias.  Skin: Negative for color change and rash.  Neurological: Negative for dizziness, light-headedness and headaches.  Psychiatric/Behavioral: Negative for agitation and dysphoric mood.       Objective:    Physical Exam Vitals reviewed.  Constitutional:      General: She is not in acute distress.    Appearance: Normal appearance.  HENT:     Head: Normocephalic and atraumatic.     Right Ear: External ear normal.     Left Ear: External ear normal.  Eyes:     General: No scleral icterus.       Right eye: No discharge.        Left eye: No discharge.     Conjunctiva/sclera: Conjunctivae normal.  Neck:     Thyroid: No thyromegaly.  Cardiovascular:     Rate and Rhythm: Normal rate and regular rhythm.  Pulmonary:     Effort: No respiratory distress.     Breath sounds: Normal breath sounds. No wheezing.  Abdominal:     General: Bowel sounds are normal.     Palpations: Abdomen is soft.     Tenderness: There is no abdominal tenderness.  Musculoskeletal:        General: No swelling or tenderness.     Cervical back: Neck supple. No tenderness.  Lymphadenopathy:     Cervical: No cervical adenopathy.  Skin:    Findings: No erythema or rash.  Neurological:     Mental Status: She is alert.  Psychiatric:        Mood and Affect: Mood normal.        Behavior: Behavior normal.     BP (!) 132/94   Pulse 68   Temp 98.3 F (36.8 C)   Ht 5' 5" (1.651 m)   Wt 234 lb (106.1 kg)   SpO2 97%   BMI 38.94 kg/m  Wt Readings from Last 3 Encounters:  02/22/21 234 lb (106.1 kg)  10/25/20 229 lb (103.9 kg)  04/19/20 (!) 226 lb (102.5 kg)     Lab Results  Component Value Date   WBC 5.9 04/19/2020   HGB 13.8 04/19/2020   HCT 41.5 04/19/2020   PLT 249.0 04/19/2020   GLUCOSE 94 04/19/2020   CHOL 141  04/19/2020   TRIG 64.0 04/19/2020   HDL 42.90 04/19/2020   LDLCALC 85 04/19/2020   ALT 11 04/19/2020   AST 15 04/19/2020   NA 135 04/19/2020   K 4.1 04/19/2020   CL 103 04/19/2020   CREATININE 0.98 04/19/2020   BUN 8 04/19/2020   CO2 27 04/19/2020   TSH 1.18 04/19/2020   HGBA1C 5.9 04/19/2020  US BREAST LTD UNI RIGHT INC AXILLA  Result Date: 11/12/2020 CLINICAL DATA:  40 year old female presenting for annual exam as well as follow-up of a probably benign left breast mass. EXAM: DIGITAL DIAGNOSTIC BILATERAL MAMMOGRAM WITH TOMOSYNTHESIS AND CAD; ULTRASOUND RIGHT BREAST LIMITED TECHNIQUE: Bilateral digital diagnostic mammography and breast tomosynthesis was performed. The images were evaluated with computer-aided detection.; Targeted ultrasound examination of the right breast was performed COMPARISON:  Previous exam(s). ACR Breast Density Category c: The breast tissue is heterogeneously dense, which may obscure small masses. FINDINGS: Mammogram: Right breast: There is an oval circumscribed mass in the upper outer quadrant of the right breast posterior depth. No additional findings are identified in the right breast. Left breast: There is a stable possible oval mass in the medial left breast posterior depth. No new findings identified in the left breast. Ultrasound: Targeted ultrasound performed in the right breast at 9:30 o'clock 7 cm from the nipple demonstrating an oval circumscribed anechoic mass measuring 1.7 x 0.8 x 1.2 cm, consistent with a benign simple cyst. No internal vascularity. This corresponds to the mammographic finding. IMPRESSION: 1. Stable probably benign left breast mass without sonographic correlate. 2.  Benign simple cyst in the right breast at 9:30 o'clock. RECOMMENDATION: Diagnostic bilateral mammogram and possible left ultrasound in 1 year. I have discussed the findings and recommendations with the patient. If applicable, a reminder letter will be sent to the patient  regarding the next appointment. BI-RADS CATEGORY  3: Probably benign. Electronically Signed   By: Audie Pinto M.D.   On: 11/12/2020 11:32   MM DIAG BREAST TOMO BILATERAL  Result Date: 11/12/2020 CLINICAL DATA:  41 year old female presenting for annual exam as well as follow-up of a probably benign left breast mass. EXAM: DIGITAL DIAGNOSTIC BILATERAL MAMMOGRAM WITH TOMOSYNTHESIS AND CAD; ULTRASOUND RIGHT BREAST LIMITED TECHNIQUE: Bilateral digital diagnostic mammography and breast tomosynthesis was performed. The images were evaluated with computer-aided detection.; Targeted ultrasound examination of the right breast was performed COMPARISON:  Previous exam(s). ACR Breast Density Category c: The breast tissue is heterogeneously dense, which may obscure small masses. FINDINGS: Mammogram: Right breast: There is an oval circumscribed mass in the upper outer quadrant of the right breast posterior depth. No additional findings are identified in the right breast. Left breast: There is a stable possible oval mass in the medial left breast posterior depth. No new findings identified in the left breast. Ultrasound: Targeted ultrasound performed in the right breast at 9:30 o'clock 7 cm from the nipple demonstrating an oval circumscribed anechoic mass measuring 1.7 x 0.8 x 1.2 cm, consistent with a benign simple cyst. No internal vascularity. This corresponds to the mammographic finding. IMPRESSION: 1. Stable probably benign left breast mass without sonographic correlate. 2.  Benign simple cyst in the right breast at 9:30 o'clock. RECOMMENDATION: Diagnostic bilateral mammogram and possible left ultrasound in 1 year. I have discussed the findings and recommendations with the patient. If applicable, a reminder letter will be sent to the patient regarding the next appointment. BI-RADS CATEGORY  3: Probably benign. Electronically Signed   By: Audie Pinto M.D.   On: 11/12/2020 11:32       Assessment & Plan:    Problem List Items Addressed This Visit    Abnormal mammogram    Mammogram 11/12/20 - [facility].  Recommended f/u diagnostic mammogram and possible left breast ultrasound in one year.       Essential hypertension    Blood pressure elevated above goal.  Increase hctz to 64m  q day.  Follow pressures.  Follow metabolic panel.  Get her back in earlier to reevaluate.       Relevant Orders   CBC with Differential/Platelet   Lipid panel   TSH   Comp Met (CMET)   Hyperglycemia - Primary    Low carb diet and exercise.  Follow met b and a1c.       Relevant Orders   Hemoglobin A1c   Migraine, unspecified, not intractable, without status migrainosus    Stable.  Follow.  Overall doing well.       Wheezing    Wheezing as outlined.  Recent cxr - ok.  Did well with previous prednisone.  Unclear trigger.  Does not smoke.  Prednisone taper as directed.  Refer to pulmonary - for further evaluation and question of need for PFTs.  No increased acid reflux.  Has rescue inhaler.           Einar Pheasant, MD

## 2021-02-24 ENCOUNTER — Encounter: Payer: Self-pay | Admitting: Internal Medicine

## 2021-02-24 DIAGNOSIS — R928 Other abnormal and inconclusive findings on diagnostic imaging of breast: Secondary | ICD-10-CM | POA: Insufficient documentation

## 2021-02-24 NOTE — Assessment & Plan Note (Signed)
Low carb diet and exercise.  Follow met b and a1c.  

## 2021-02-24 NOTE — Assessment & Plan Note (Signed)
Mammogram 11/12/20 - [facility] III.  Recommended f/u diagnostic mammogram and possible left breast ultrasound in one year.

## 2021-02-24 NOTE — Assessment & Plan Note (Signed)
Wheezing as outlined.  Recent cxr - ok.  Did well with previous prednisone.  Unclear trigger.  Does not smoke.  Prednisone taper as directed.  Refer to pulmonary - for further evaluation and question of need for PFTs.  No increased acid reflux.  Has rescue inhaler.

## 2021-02-24 NOTE — Assessment & Plan Note (Signed)
Blood pressure elevated above goal.  Increase hctz to 25mg  q day.  Follow pressures.  Follow metabolic panel.  Get her back in earlier to reevaluate.

## 2021-02-24 NOTE — Assessment & Plan Note (Signed)
Stable.  Follow.  Overall doing well.

## 2021-02-25 LAB — COMPREHENSIVE METABOLIC PANEL
ALT: 11 U/L (ref 0–35)
AST: 16 U/L (ref 0–37)
Albumin: 4.4 g/dL (ref 3.5–5.2)
Alkaline Phosphatase: 58 U/L (ref 39–117)
BUN: 13 mg/dL (ref 6–23)
CO2: 25 mEq/L (ref 19–32)
Calcium: 9.8 mg/dL (ref 8.4–10.5)
Chloride: 103 mEq/L (ref 96–112)
Creatinine, Ser: 1.01 mg/dL (ref 0.40–1.20)
GFR: 69.01 mL/min (ref >60.00)
Glucose, Bld: 79 mg/dL (ref 70–99)
Potassium: 4.2 mEq/L (ref 3.5–5.1)
Sodium: 138 mEq/L (ref 135–145)
Total Bilirubin: 0.6 mg/dL (ref 0.2–1.2)
Total Protein: 7.6 g/dL (ref 6.0–8.3)

## 2021-02-25 LAB — LIPID PANEL
Cholesterol: 149 mg/dL (ref 0–200)
HDL: 43.4 mg/dL (ref >39.00)
LDL Cholesterol: 88 mg/dL (ref 0–99)
NonHDL: 105.55
Total CHOL/HDL Ratio: 3
Triglycerides: 87 mg/dL (ref 0.0–149.0)
VLDL: 17.4 mg/dL (ref 0.0–40.0)

## 2021-03-20 ENCOUNTER — Encounter: Payer: Self-pay | Admitting: Internal Medicine

## 2021-03-20 DIAGNOSIS — Z0000 Encounter for general adult medical examination without abnormal findings: Secondary | ICD-10-CM | POA: Insufficient documentation

## 2021-03-20 MED FILL — Hydrochlorothiazide Cap 12.5 MG: ORAL | 90 days supply | Qty: 90 | Fill #0 | Status: AC

## 2021-03-21 ENCOUNTER — Other Ambulatory Visit (HOSPITAL_COMMUNITY): Payer: Self-pay

## 2021-04-22 ENCOUNTER — Encounter: Payer: Self-pay | Admitting: Internal Medicine

## 2021-04-23 ENCOUNTER — Other Ambulatory Visit: Payer: Self-pay | Admitting: Internal Medicine

## 2021-04-23 MED ORDER — HYDROCHLOROTHIAZIDE 25 MG PO TABS
25.0000 mg | ORAL_TABLET | Freq: Every day | ORAL | 1 refills | Status: DC
  Filled 2021-04-23: qty 90, 90d supply, fill #0
  Filled 2021-08-27: qty 90, 90d supply, fill #1

## 2021-04-23 NOTE — Progress Notes (Signed)
Rx ok'd for hctz '25mg'$  #90 with one refill.

## 2021-04-24 ENCOUNTER — Other Ambulatory Visit (HOSPITAL_COMMUNITY): Payer: Self-pay

## 2021-04-27 IMAGING — DX DG CHEST 2V
2 series · 2 of 2 positions shown · non-contrast
Comparison: None.

CLINICAL DATA: Wheezing

EXAM:
CHEST - 2 VIEW

[chest pa]
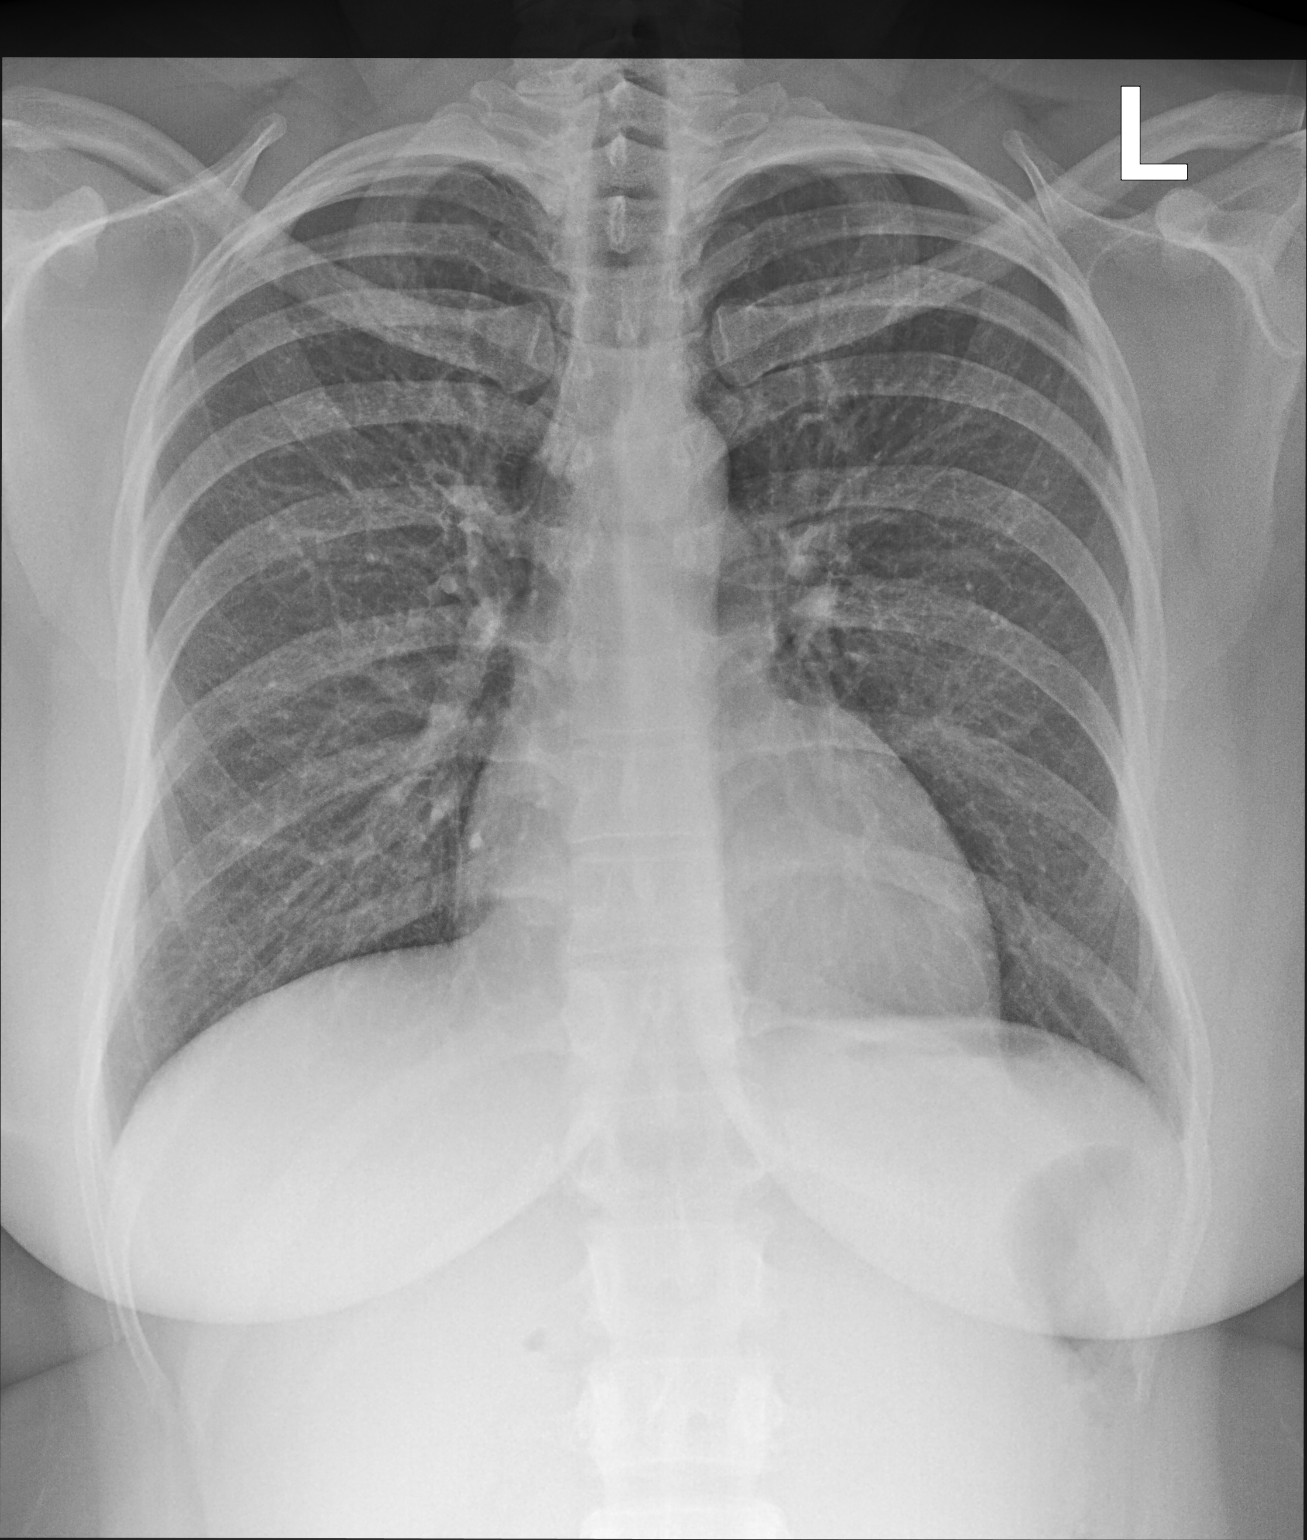

[chest lat]
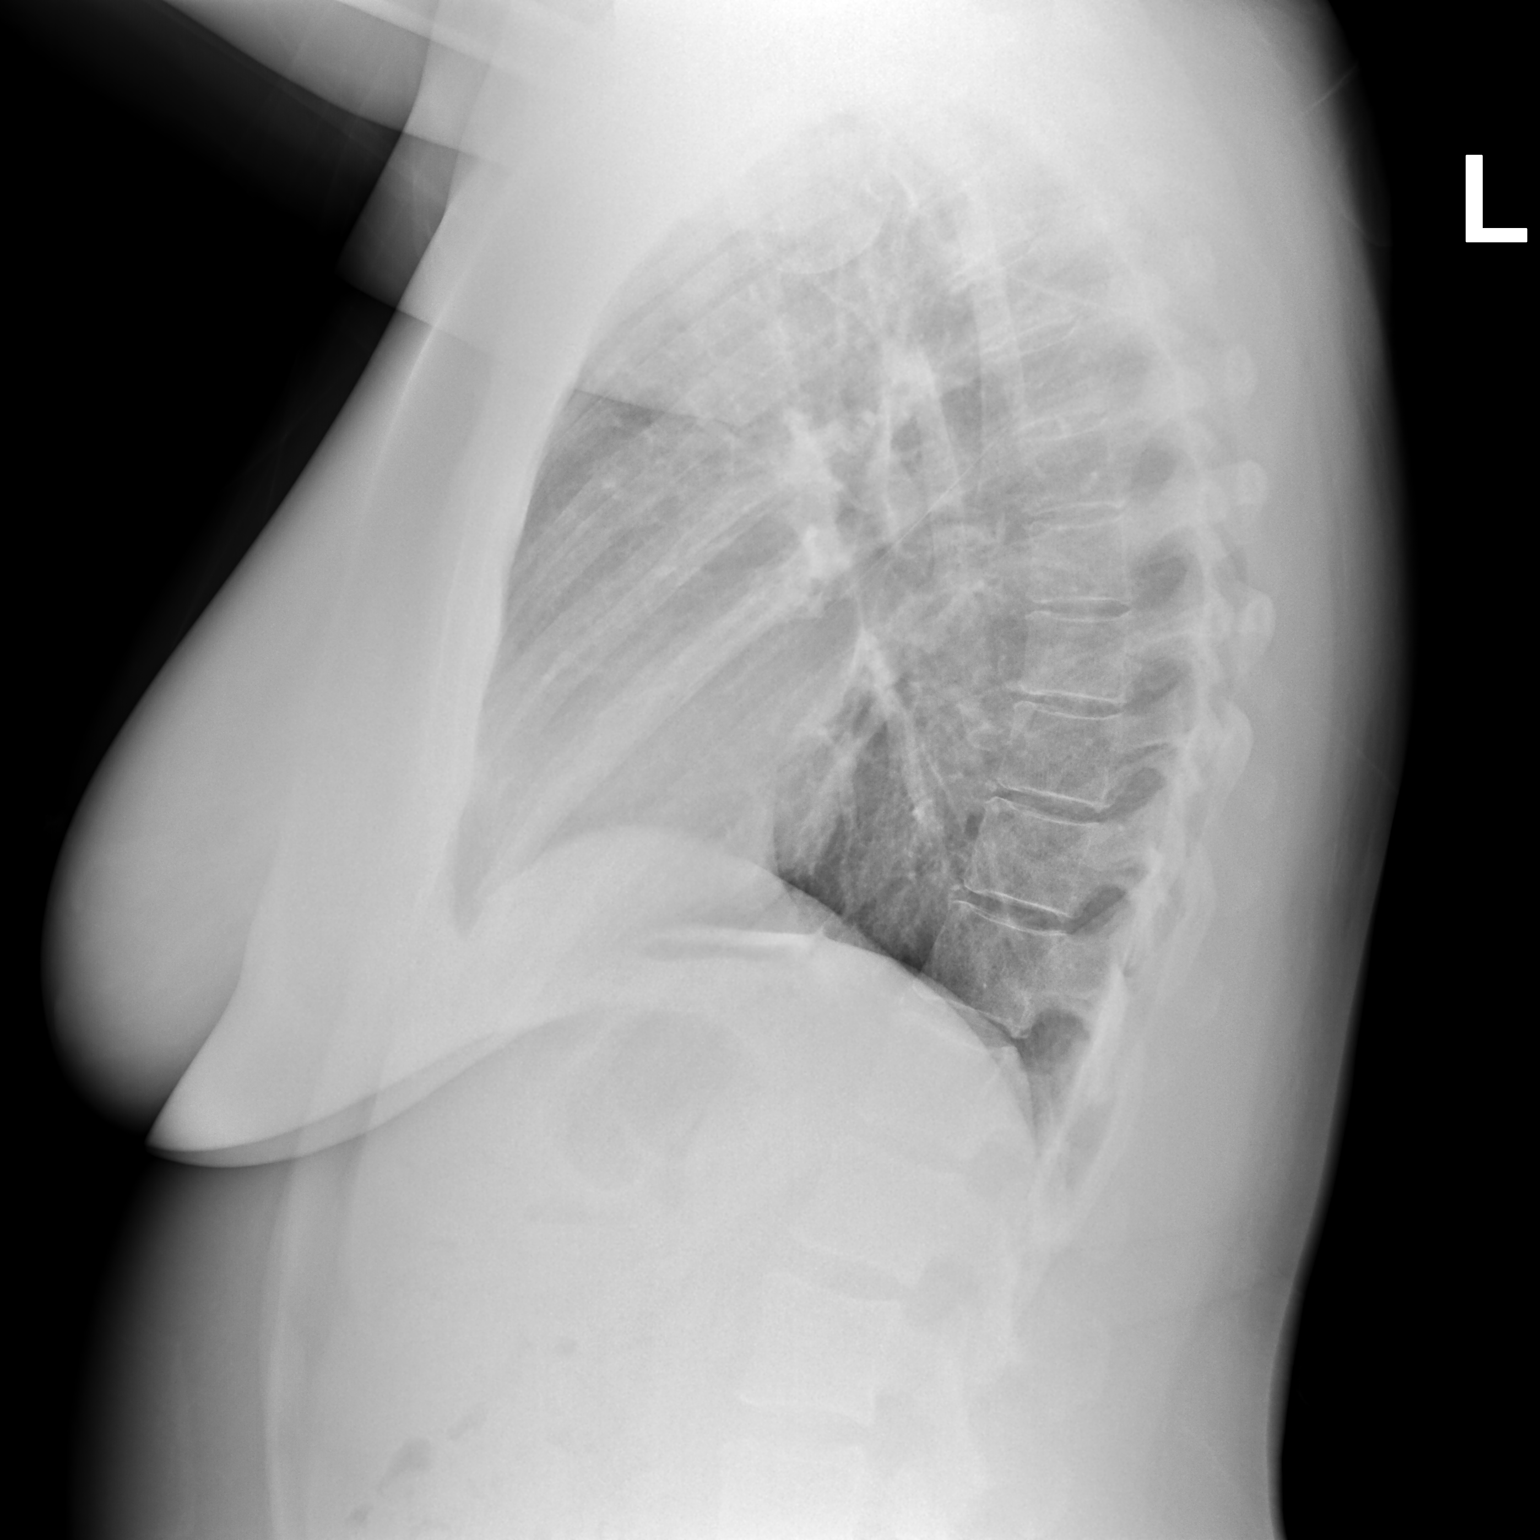

[2 of 2 positions shown; findings below may reference images not displayed]

FINDINGS: The cardiomediastinal silhouette is normal in contour. No pleural
effusion. No pneumothorax. No acute pleuroparenchymal abnormality.
Visualized abdomen is unremarkable. No acute osseous abnormality
noted.
IMPRESSION: No active cardiopulmonary disease.

## 2021-05-08 ENCOUNTER — Encounter: Payer: Self-pay | Admitting: Internal Medicine

## 2021-05-08 ENCOUNTER — Ambulatory Visit: Payer: 59 | Admitting: Internal Medicine

## 2021-05-08 ENCOUNTER — Other Ambulatory Visit: Payer: Self-pay

## 2021-05-08 DIAGNOSIS — R062 Wheezing: Secondary | ICD-10-CM

## 2021-05-08 DIAGNOSIS — I10 Essential (primary) hypertension: Secondary | ICD-10-CM

## 2021-05-08 DIAGNOSIS — R739 Hyperglycemia, unspecified: Secondary | ICD-10-CM | POA: Diagnosis not present

## 2021-05-08 DIAGNOSIS — Z0184 Encounter for antibody response examination: Secondary | ICD-10-CM | POA: Diagnosis not present

## 2021-05-08 DIAGNOSIS — Z1159 Encounter for screening for other viral diseases: Secondary | ICD-10-CM

## 2021-05-08 DIAGNOSIS — Z114 Encounter for screening for human immunodeficiency virus [HIV]: Secondary | ICD-10-CM | POA: Diagnosis not present

## 2021-05-08 LAB — BASIC METABOLIC PANEL
BUN: 9 mg/dL (ref 6–23)
CO2: 29 mEq/L (ref 19–32)
Calcium: 9.9 mg/dL (ref 8.4–10.5)
Chloride: 100 mEq/L (ref 96–112)
Creatinine, Ser: 0.96 mg/dL (ref 0.40–1.20)
GFR: 73.25 mL/min (ref >60.00)
Glucose, Bld: 104 mg/dL — ABNORMAL HIGH (ref 70–99)
Potassium: 3.9 mEq/L (ref 3.5–5.1)
Sodium: 138 mEq/L (ref 135–145)

## 2021-05-08 NOTE — Progress Notes (Signed)
Patient ID: Jakaylee Sasaki, female   DOB: Jun 20, 1981, 40 y.o.   MRN: [id number]   Subjective:    Patient ID: Arelyn Gauer, female    DOB: 05-Oct-1980, 40 y.o.   MRN: [id number]  HPI This visit occurred during the SARS-CoV-2 public health emergency.  Safety protocols were in place, including screening questions prior to the visit, additional usage of staff PPE, and extensive cleaning of exam room while observing appropriate contact time as indicated for disinfecting solutions.   Patient here for a scheduled follow up. Here to follow up regarding her blood pressure and breathing issues.  Last visit, hctz was increased to 42m q day.  Blood pressures have been doing better.  Tolerating the medication.  Trying to stay active.  Started walking - 2 days per week.  Plans to increase.  No chest pain with exertion.  Did have episode yesterday after drinking a milkshake - wheezing.  Better today.  Had to use her rescue inhaler last night - for the first time in a while.    Past Medical History:  Diagnosis Date   Headache    Hypertension    Migraines    Osteoarthritis    Past Surgical History:  Procedure Laterality Date   WISDOM TOOTH EXTRACTION N/A 2008   Approx x4    Family History  Problem Relation Age of Onset   Cancer Father        unknown   Diabetes Paternal Grandmother    Hypertension Maternal Grandmother    Rheum arthritis Maternal Grandmother    Cancer Maternal Grandfather    Social History   Socioeconomic History   Marital status: Single    Spouse name: Not on file   Number of children: 0   Years of education: Not on file   Highest education level: Not on file  Occupational History   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  Tobacco Use   Smoking status: Never   Smokeless tobacco: Never  Vaping Use   Vaping Use: Never used  Substance and Sexual Activity   Alcohol use: Yes    Alcohol/week: 0.0 standard drinks    Comment: occ   Drug use: No   Sexual activity: Yes     Partners: Male    Comment: 1 partner  Other Topics Concern   Not on file  Social History Narrative   Works here at [employer]by herself    2 dogs live inside    Caffeine- Occasional    Right handed    Enjoys sleeping    Social Determinants of Health   Financial Resource Strain: Not on file  Food Insecurity: Not on file  Transportation Needs: Not on file  Physical Activity: Not on file  Stress: Not on file  Social Connections: Not on file    Review of Systems  Constitutional:  Negative for appetite change and unexpected weight change.  HENT:  Negative for congestion and sinus pressure.   Respiratory:  Negative for cough, chest tightness and shortness of breath.        Previous wheezing.   Cardiovascular:  Negative for chest pain, palpitations and leg swelling.  Gastrointestinal:  Negative for abdominal pain, diarrhea, nausea and vomiting.  Genitourinary:  Negative for difficulty urinating and dysuria.  Musculoskeletal:  Negative for joint swelling and myalgias.  Skin:  Negative for color change and rash.  Neurological:  Negative for dizziness, light-headedness and headaches.  Psychiatric/Behavioral:  Negative for agitation and dysphoric mood.  Objective:    Physical Exam Vitals reviewed.  Constitutional:      General: She is not in acute distress.    Appearance: Normal appearance.  HENT:     Head: Normocephalic and atraumatic.     Right Ear: External ear normal.     Left Ear: External ear normal.  Eyes:     General: No scleral icterus.       Right eye: No discharge.        Left eye: No discharge.     Conjunctiva/sclera: Conjunctivae normal.  Neck:     Thyroid: No thyromegaly.  Cardiovascular:     Rate and Rhythm: Normal rate and regular rhythm.  Pulmonary:     Effort: No respiratory distress.     Breath sounds: Normal breath sounds. No wheezing.  Abdominal:     General: Bowel sounds are normal.     Palpations: Abdomen is soft.     Tenderness:  There is no abdominal tenderness.  Musculoskeletal:        General: No swelling or tenderness.     Cervical back: Neck supple. No tenderness.  Lymphadenopathy:     Cervical: No cervical adenopathy.  Skin:    Findings: No erythema or rash.  Neurological:     Mental Status: She is alert.  Psychiatric:        Mood and Affect: Mood normal.        Behavior: Behavior normal.    BP 120/78   Pulse 84   Temp 98 F (36.7 C)   Resp 16   Ht _0  (1.651 m)   Wt 231 lb 6.4 oz (105 kg)   SpO2 99%   BMI 38.51 kg/m  Wt Readings from Last 3 Encounters:  05/08/21 231 lb 6.4 oz (105 kg)  02/22/21 234 lb (106.1 kg)  10/25/20 229 lb (103.9 kg)    Outpatient Encounter Medications as of 05/08/2021  Medication Sig   albuterol (VENTOLIN HFA) 108 (90 Base) MCG/ACT inhaler INHALE 1-2 PUFFS INTO THE LUNGS EVERY 6 (SIX) HOURS AS NEEDED FOR WHEEZING OR SHORTNESS OF BREATH.   hydrochlorothiazide (HYDRODIURIL) 25 MG tablet Take 1 tablet (25 mg total) by mouth daily.   levalbuterol (XOPENEX HFA) 45 MCG/ACT inhaler INHALE 2 PUFFS INTO THE LUNGS EVERY 6 (SIX) HOURS AS NEEDED FOR WHEEZING.   levonorgestrel (MIRENA, 52 MG,) 20 MCG/24HR IUD Mirena 20 mcg/24 hours (6 yrs) 52 mg intrauterine device  Take 1 device by intrauterine route.   [DISCONTINUED] benzonatate (TESSALON) 200 MG capsule TAKE 1 CAPSULE (200 MG TOTAL) BY MOUTH 2 (TWO) TIMES DAILY AS NEEDED FOR COUGH. (Patient not taking: Reported on 05/08/2021)   [DISCONTINUED] COVID-19 mRNA vaccine, Pfizer, 30 MCG/0.3ML injection USE AS DIRECTED   [DISCONTINUED] predniSONE (DELTASONE) 10 MG tablet Take 6 tablet by mouth on day 1 then decrease by 1/2 tablet every day until it is gone. (Patient not taking: Reported on 05/08/2021)   No facility-administered encounter medications on file as of 05/08/2021.     Lab Results  Component Value Date   WBC 5.7 02/22/2021   HGB 14.2 02/22/2021   HCT 42.6 02/22/2021   PLT 260.0 02/22/2021   GLUCOSE 104 (H) 05/08/2021    CHOL 149 02/22/2021   TRIG 87.0 02/22/2021   HDL 43.40 02/22/2021   LDLCALC 88 02/22/2021   ALT 11 02/22/2021   AST 16 02/22/2021   NA 138 05/08/2021   K 3.9 05/08/2021   CL 100 05/08/2021   CREATININE 0.96 05/08/2021   BUN  9 05/08/2021   CO2 29 05/08/2021   TSH 1.83 02/22/2021   HGBA1C 5.9 02/22/2021    US BREAST LTD UNI RIGHT INC AXILLA  Result Date: 11/12/2020 CLINICAL DATA:  40 year old female presenting for annual exam as well as follow-up of a probably benign left breast mass. EXAM: DIGITAL DIAGNOSTIC BILATERAL MAMMOGRAM WITH TOMOSYNTHESIS AND CAD; ULTRASOUND RIGHT BREAST LIMITED TECHNIQUE: Bilateral digital diagnostic mammography and breast tomosynthesis was performed. The images were evaluated with computer-aided detection.; Targeted ultrasound examination of the right breast was performed COMPARISON:  Previous exam(s). ACR Breast Density Category c: The breast tissue is heterogeneously dense, which may obscure small masses. FINDINGS: Mammogram: Right breast: There is an oval circumscribed mass in the upper outer quadrant of the right breast posterior depth. No additional findings are identified in the right breast. Left breast: There is a stable possible oval mass in the medial left breast posterior depth. No new findings identified in the left breast. Ultrasound: Targeted ultrasound performed in the right breast at 9:30 o'clock 7 cm from the nipple demonstrating an oval circumscribed anechoic mass measuring 1.7 x 0.8 x 1.2 cm, consistent with a benign simple cyst. No internal vascularity. This corresponds to the mammographic finding. IMPRESSION: 1. Stable probably benign left breast mass without sonographic correlate. 2.  Benign simple cyst in the right breast at 9:30 o'clock. RECOMMENDATION: Diagnostic bilateral mammogram and possible left ultrasound in 1 year. I have discussed the findings and recommendations with the patient. If applicable, a reminder letter will be sent to the patient  regarding the next appointment. BI-RADS CATEGORY  3: Probably benign. Electronically Signed   By: Audie Pinto M.D.   On: 11/12/2020 11:32   MM DIAG BREAST TOMO BILATERAL  Result Date: 11/12/2020 CLINICAL DATA:  40 year old female presenting for annual exam as well as follow-up of a probably benign left breast mass. EXAM: DIGITAL DIAGNOSTIC BILATERAL MAMMOGRAM WITH TOMOSYNTHESIS AND CAD; ULTRASOUND RIGHT BREAST LIMITED TECHNIQUE: Bilateral digital diagnostic mammography and breast tomosynthesis was performed. The images were evaluated with computer-aided detection.; Targeted ultrasound examination of the right breast was performed COMPARISON:  Previous exam(s). ACR Breast Density Category c: The breast tissue is heterogeneously dense, which may obscure small masses. FINDINGS: Mammogram: Right breast: There is an oval circumscribed mass in the upper outer quadrant of the right breast posterior depth. No additional findings are identified in the right breast. Left breast: There is a stable possible oval mass in the medial left breast posterior depth. No new findings identified in the left breast. Ultrasound: Targeted ultrasound performed in the right breast at 9:30 o'clock 7 cm from the nipple demonstrating an oval circumscribed anechoic mass measuring 1.7 x 0.8 x 1.2 cm, consistent with a benign simple cyst. No internal vascularity. This corresponds to the mammographic finding. IMPRESSION: 1. Stable probably benign left breast mass without sonographic correlate. 2.  Benign simple cyst in the right breast at 9:30 o'clock. RECOMMENDATION: Diagnostic bilateral mammogram and possible left ultrasound in 1 year. I have discussed the findings and recommendations with the patient. If applicable, a reminder letter will be sent to the patient regarding the next appointment. BI-RADS CATEGORY  3: Probably benign. Electronically Signed   By: Audie Pinto M.D.   On: 11/12/2020 11:32       Assessment & Plan:    Problem List Items Addressed This Visit     Essential hypertension    Blood pressure better on hctz 9m q day.  Continue to follow pressures.  Follow metabolic panel.  Relevant Orders   Basic metabolic panel (Completed)   Hyperglycemia    Low carb diet and exercise.  Follow met b and a1c.       Wheezing    Has had intermittent issues with wheezing as outlined.  See previous note.  cxr - ok.  Has had intermittent prednisone tapers.  Does not smoke.  Have discussed referral to pulmonary - for further evaluation and question of need for further w/up, including PFTs, etc.  No increased acid reflux.        Relevant Orders   Ambulatory referral to Pulmonology   Other Visit Diagnoses     Encounter for hepatitis C screening test for low risk patient       Relevant Orders   Hepatitis C antibody (Completed)   Screening for HIV without presence of risk factors       Relevant Orders   HIV antibody (with reflex) (Completed)   Immunity status testing       Relevant Orders   Varicella zoster antibody, IgG (Completed)   Measles/Mumps/Rubella Immunity (Completed)        Einar Pheasant, MD

## 2021-05-09 LAB — HEPATITIS C ANTIBODY
Hepatitis C Ab: NONREACTIVE
SIGNAL TO CUT-OFF: 0.02 (ref <1.00)

## 2021-05-09 LAB — MEASLES/MUMPS/RUBELLA IMMUNITY
Mumps IgG: >300 AU/mL
Rubella: 8.76 Index
Rubeola IgG: >300 AU/mL

## 2021-05-09 LAB — HIV ANTIBODY (ROUTINE TESTING W REFLEX): HIV 1&2 Ab, 4th Generation: NONREACTIVE

## 2021-05-09 LAB — VARICELLA ZOSTER ANTIBODY, IGG: Varicella IgG: 807.2 index

## 2021-05-13 ENCOUNTER — Encounter: Payer: Self-pay | Admitting: Internal Medicine

## 2021-05-13 NOTE — Assessment & Plan Note (Signed)
Blood pressure better on hctz 25mg q day.  Continue to follow pressures.  Follow metabolic panel.   

## 2021-05-13 NOTE — Assessment & Plan Note (Signed)
Low carb diet and exercise.  Follow met b and a1c.  

## 2021-05-13 NOTE — Assessment & Plan Note (Signed)
Has had intermittent issues with wheezing as outlined.  See previous note.  cxr - ok.  Has had intermittent prednisone tapers.  Does not smoke.  Have discussed referral to pulmonary - for further evaluation and question of need for further w/up, including PFTs, etc.  No increased acid reflux.

## 2021-06-05 DIAGNOSIS — Z111 Encounter for screening for respiratory tuberculosis: Secondary | ICD-10-CM | POA: Diagnosis not present

## 2021-06-07 ENCOUNTER — Other Ambulatory Visit (HOSPITAL_COMMUNITY): Payer: Self-pay

## 2021-06-07 ENCOUNTER — Ambulatory Visit: Payer: 59 | Admitting: Internal Medicine

## 2021-06-07 ENCOUNTER — Other Ambulatory Visit: Payer: Self-pay

## 2021-06-07 ENCOUNTER — Other Ambulatory Visit
Admission: RE | Admit: 2021-06-07 | Discharge: 2021-06-07 | Disposition: A | Discharge status: Home / Self Care | Payer: 59 | Source: Ambulatory Visit | Attending: Internal Medicine | Admitting: Internal Medicine

## 2021-06-07 ENCOUNTER — Encounter: Payer: Self-pay | Admitting: Internal Medicine

## 2021-06-07 DIAGNOSIS — Z111 Encounter for screening for respiratory tuberculosis: Secondary | ICD-10-CM | POA: Diagnosis not present

## 2021-06-07 DIAGNOSIS — J45991 Cough variant asthma: Secondary | ICD-10-CM

## 2021-06-07 DIAGNOSIS — J4530 Mild persistent asthma, uncomplicated: Secondary | ICD-10-CM | POA: Insufficient documentation

## 2021-06-07 DIAGNOSIS — Z23 Encounter for immunization: Secondary | ICD-10-CM | POA: Diagnosis not present

## 2021-06-07 LAB — CBC WITH DIFFERENTIAL/PLATELET
Abs Immature Granulocytes: 0.01 10*3/uL (ref 0.00–0.07)
Basophils Absolute: 0.1 10*3/uL (ref 0.0–0.1)
Basophils Relative: 1 %
Eosinophils Absolute: 0.7 10*3/uL — ABNORMAL HIGH (ref 0.0–0.5)
Eosinophils Relative: 11 %
HCT: 42.9 % (ref 36.0–46.0)
Hemoglobin: 14.8 g/dL (ref 12.0–15.0)
Immature Granulocytes: 0 %
Lymphocytes Relative: 37 %
Lymphs Abs: 2.4 10*3/uL (ref 0.7–4.0)
MCH: 30.6 pg (ref 26.0–34.0)
MCHC: 34.5 g/dL (ref 30.0–36.0)
MCV: 88.8 fL (ref 80.0–100.0)
Monocytes Absolute: 0.4 10*3/uL (ref 0.1–1.0)
Monocytes Relative: 7 %
Neutro Abs: 2.9 10*3/uL (ref 1.7–7.7)
Neutrophils Relative %: 44 %
Platelets: 281 10*3/uL (ref 150–400)
RBC: 4.83 MIL/uL (ref 3.87–5.11)
RDW: 13.5 % (ref 11.5–15.5)
WBC: 6.6 10*3/uL (ref 4.0–10.5)
nRBC: 0 % (ref 0.0–0.2)

## 2021-06-07 MED ORDER — PREDNISONE 10 MG PO TABS
ORAL_TABLET | ORAL | 0 refills | Status: AC
  Filled 2021-06-07: qty 14, 6d supply, fill #0

## 2021-06-07 MED ORDER — MONTELUKAST SODIUM 10 MG PO TABS
10.0000 mg | ORAL_TABLET | Freq: Every day | ORAL | 11 refills | Status: DC
  Filled 2021-06-07: qty 30, 30d supply, fill #0
  Filled 2021-07-03: qty 30, 30d supply, fill #1
  Filled 2021-08-03: qty 30, 30d supply, fill #2
  Filled 2021-09-20: qty 30, 30d supply, fill #3
  Filled 2021-11-20: qty 30, 30d supply, fill #4
  Filled 2022-02-12: qty 30, 30d supply, fill #5
  Filled 2022-03-31: qty 30, 30d supply, fill #6
  Filled 2022-05-08: qty 30, 30d supply, fill #7
  Filled 2022-06-03: qty 30, 30d supply, fill #8

## 2021-06-07 MED ORDER — AMOXICILLIN-POT CLAVULANATE 875-125 MG PO TABS
1.0000 | ORAL_TABLET | Freq: Two times a day (BID) | ORAL | 0 refills | Status: AC
  Filled 2021-06-07: qty 20, 10d supply, fill #0

## 2021-06-07 NOTE — Progress Notes (Signed)
Ariel Perez, female    DOB: January 03, 1981    MRN: [id number]   Brief patient profile:  85 yobf never smoker  referred to pulmonary clinic in [facility]  06/07/2021 by Dr Nicki Reaper  for wheezing onset  Nov 2021 p URI with a background of recurrent sinus infections x 2017 rx abx / prednisone usually works but the last "sinus infection"  rx abx only and  " wheezing ever since"     History of Present Illness  06/07/2021  Pulmonary/ 1st office eval/ Melvyn Novas / [facility]  Chief Complaint  Patient presents with   Consult    Pt states wheezing since November last year.   Dyspnea:  wheezes more with walking but not really limited  Cough: esp p supper / slt yellow assoc with nasal drainage and congestion  Sleep: 3 x per week gets up and uses inhaler / xopenex helps some  SABA use: 4 puffs avg saba  q 24 h  Moved to present house 3 y prior to OV  with basement  No obvious day to day or daytime variability or assoc excess/ purulent sputum or mucus plugs or hemoptysis or cp or chest tightness,   or overt  hb symptoms.    . Also denies any obvious fluctuation of symptoms with weather or environmental changes or other aggravating or alleviating factors except as outlined above   No unusual exposure hx or h/o childhood pna/ asthma or knowledge of premature birth.  Current Allergies, Complete Past Medical History, Past Surgical History, Family History, and Social History were reviewed in [facility] record.  ROS  The following are not active complaints unless bolded Hoarseness, sore throat, dysphagia, dental problems, itching, sneezing,  nasal congestion or discharge of excess mucus or purulent secretions, ear ache,   fever, chills, sweats, unintended wt loss or wt gain, classically pleuritic or exertional cp,  orthopnea pnd or arm/hand swelling  or leg swelling, presyncope, palpitations, abdominal pain, anorexia, nausea, vomiting, diarrhea  or change in bowel habits or  change in bladder habits, change in stools or change in urine, dysuria, hematuria,  rash, arthralgias, visual complaints, headache, numbness, weakness or ataxia or problems with walking or coordination,  change in mood or  memory.             Past Medical History:  Diagnosis Date   Headache    Hypertension    Migraines    Osteoarthritis     Outpatient Medications Prior to Visit  Medication Sig Dispense Refill   hydrochlorothiazide (HYDRODIURIL) 25 MG tablet Take 1 tablet (25 mg total) by mouth daily. 90 tablet 1   levalbuterol (XOPENEX HFA) 45 MCG/ACT inhaler INHALE 2 PUFFS INTO THE LUNGS EVERY 6 (SIX) HOURS AS NEEDED FOR WHEEZING. 15 g 1   levonorgestrel (MIRENA, 52 MG,) 20 MCG/24HR IUD Mirena 20 mcg/24 hours (6 yrs) 52 mg intrauterine device  Take 1 device by intrauterine route.     albuterol (VENTOLIN HFA) 108 (90 Base) MCG/ACT inhaler INHALE 1-2 PUFFS INTO THE LUNGS EVERY 6 (SIX) HOURS AS NEEDED FOR WHEEZING OR SHORTNESS OF BREATH. (Patient not taking: Reported on 06/07/2021) 18 g 0   No facility-administered medications prior to visit.     Objective:     BP 138/80 (BP Location: Left Arm, Patient Position: Sitting, Cuff Size: Large)   Pulse 77   Temp (!) 96.9 F (36.1 C) (Oral)   Ht '5\' 5"'$  (1.651 m)   Wt 227 lb 3.2 oz (103.1  kg)   SpO2 97%   BMI 37.81 kg/m   SpO2: 97 %  Amb pleasant bf nad   HEENT : pt wearing mask not removed for exam due to covid -19 concerns.    NECK :  without JVD/Nodes/TM/ nl carotid upstrokes bilaterally   LUNGS: no acc muscle use,  Nl contour chest with scattered insp squeaks/ pops and late exp wheeze bilaterally     CV:  RRR  no s3 or murmur or increase in P2, and no edema   ABD:  soft and nontender with nl inspiratory excursion in the supine position. No bruits or organomegaly appreciated, bowel sounds nl  MS:  Nl gait/ ext warm without deformities, calf tenderness, cyanosis or clubbing No obvious joint restrictions   SKIN: warm  and dry without lesions    NEURO:  alert, approp, nl sensorium with  no motor or cerebellar deficits apparent.    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  If allergy profile neg low threshold for sinus CT            Each maintenance medication was reviewed in detail including emphasizing most importantly the difference between maintenance and prns and under what circumstances the prns are to be triggered using an action plan format where appropriate.  Total time for H and P, chart review, counseling, reviewing hfa  device(s) and generating customized AVS unique to this office visit / same day charting = 45 min      Christinia Gully, MD 06/07/2021

## 2021-06-07 NOTE — Patient Instructions (Addendum)
Augmentin 875 mg take one pill twice daily  X 10 days - take at breakfast and supper with large glass of water.  It would help reduce the usual side effects (diarrhea and yeast infections) if you ate cultured yogurt at lunch.   Prednisone 10 mg take  4 each am x 2 days,   2 each am x 2 days,  1 each am x 2 days and stop   Singulair 10 mg one each evening  maintenance   Only use your levoalbuterol as a rescue medication to be used if you can't catch your breath by resting or doing a relaxed purse lip breathing pattern.  - The less you use it, the better it will work when you need it. - Ok to use up to 2 puffs  every 4 hours if you must but call for immediate appointment if use goes up over your usual need - Don't leave home without it !!  (think of it like the spare tire for your car)   Work on inhaler technique:  relax and gently blow all the way out then take a nice smooth full deep breath back in, triggering the inhaler at same time you start breathing in.  Blow out thru nose. Rinse and gargle with water when done.  If mouth or throat bother you at all,  try brushing teeth/gums/tongue with arm and hammer toothpaste/ make a slurry and gargle and spit out.      Please remember to go to the lab department   for your tests - we will call you with the results when they are available.    Please schedule a follow up office visit in 6 weeks, call sooner if needed

## 2021-06-07 NOTE — Assessment & Plan Note (Signed)
Onset ? 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  If allergy profile neg low threshold for sinus CT            Each maintenance medication was reviewed in detail including emphasizing most importantly the difference between maintenance and prns and under what circumstances the prns are to be triggered using an action plan format where appropriate.  Total time for H and P, chart review, counseling, reviewing hfa  device(s) and generating customized AVS unique to this office visit / same day charting = 45 min

## 2021-06-13 LAB — IGE: IgE (Immunoglobulin E), Serum: 63 IU/mL (ref 6–495)

## 2021-06-14 DIAGNOSIS — Z23 Encounter for immunization: Secondary | ICD-10-CM | POA: Diagnosis not present

## 2021-06-14 DIAGNOSIS — Z111 Encounter for screening for respiratory tuberculosis: Secondary | ICD-10-CM | POA: Diagnosis not present

## 2021-06-16 DIAGNOSIS — Z111 Encounter for screening for respiratory tuberculosis: Secondary | ICD-10-CM | POA: Diagnosis not present

## 2021-07-03 ENCOUNTER — Other Ambulatory Visit (HOSPITAL_COMMUNITY): Payer: Self-pay

## 2021-07-29 ENCOUNTER — Ambulatory Visit: Payer: 59 | Admitting: Internal Medicine

## 2021-07-29 ENCOUNTER — Other Ambulatory Visit: Payer: Self-pay

## 2021-07-29 ENCOUNTER — Other Ambulatory Visit (HOSPITAL_COMMUNITY): Payer: Self-pay

## 2021-07-29 ENCOUNTER — Encounter: Payer: Self-pay | Admitting: Internal Medicine

## 2021-07-29 DIAGNOSIS — E010 Iodine-deficiency related diffuse (endemic) goiter: Secondary | ICD-10-CM | POA: Insufficient documentation

## 2021-07-29 DIAGNOSIS — J45991 Cough variant asthma: Secondary | ICD-10-CM

## 2021-07-29 MED ORDER — BUDESONIDE-FORMOTEROL FUMARATE 80-4.5 MCG/ACT IN AERO
INHALATION_SPRAY | RESPIRATORY_TRACT | 12 refills | Status: DC
  Filled 2021-07-29: qty 10.2, 30d supply, fill #0
  Filled 2021-08-27: qty 10.2, 30d supply, fill #1
  Filled 2021-11-20: qty 10.2, 30d supply, fill #2
  Filled 2022-04-09: qty 10.2, 30d supply, fill #3

## 2021-07-29 NOTE — Patient Instructions (Signed)
Plan A = Automatic = Always=    Symbicort 80 Take 2 puffs first thing in am and then another 2 puffs about 12 hours later.    Work on inhaler technique:  relax and gently blow all the way out then take a nice smooth full deep breath back in, triggering the inhaler at same time you start breathing in.  Hold for up to 5 seconds if you can. Blow out thru nose. Rinse and gargle with water when done.  If mouth or throat bother you at all,  try brushing teeth/gums/tongue with arm and hammer toothpaste/ make a slurry and gargle and spit out.       Plan B = Backup (to supplement plan A, not to replace it) Only use your albuterol inhaler as a rescue medication to be used if you can't catch your breath by resting or doing a relaxed purse lip breathing pattern.  - The less you use it, the better it will work when you need it. - Ok to use the inhaler up to 2 puffs  every 4 hours if you must but call for appointment if use goes up over your usual need - Don't leave home without it !!  (think of it like the spare tire for your car)      We will be scheduling you a scan of your thyroid and I will call you the results   Please schedule a follow up office visit in 6 weeks, call sooner if needed

## 2021-07-29 NOTE — Assessment & Plan Note (Signed)
Onset ? 2017 assoc with sinus dz / worse since Nov 2021  - 06/07/2021  After extensive coaching inhaler device,  effectiveness =    75%  - 06/07/2021 trial of singulair - Allergy profile 06/07/2021 >  Eos 0.6 /  IgE  63 - 07/29/2021  After extensive coaching inhaler device,  effectiveness =    80% start symbicort 80   Elimination of symptoms while on prednisone relapsed on singulair though most of the wheezing is upper airway and I suspect she may have a suprasternal TM palp on exam so rec  Continue singulair Start Symbicort 80 2bid  Proceed with thryoid w/u  F/u in 6 weeks         Each maintenance medication was reviewed in detail including emphasizing most importantly the difference between maintenance and prns and under what circumstances the prns are to be triggered using an action plan format where appropriate.  Total time for H and P, chart review, counseling, reviewing hfa  device(s) and generating customized AVS unique to this office visit / same day charting  > 38min

## 2021-07-29 NOTE — Progress Notes (Signed)
Ariel Perez, female    DOB: 06-Oct-1980    MRN: [id number]   Brief patient profile:  28 yobf never smoker  referred to pulmonary clinic in [facility]  06/07/2021 by Dr Nicki Reaper  for wheezing onset  Nov 2021 p URI with a background of recurrent sinus infections x 2017 rx abx / prednisone usually works but the last "sinus infection"  rx abx only and  " wheezing ever since"     History of Present Illness  06/07/2021  Pulmonary/ 1st office eval/ Melvyn Novas / [facility]  Chief Complaint  Patient presents with   Consult    Pt states wheezing since November last year.   Dyspnea:  wheezes more with walking but not really limited  Cough: esp p supper / slt yellow assoc with nasal drainage and congestion  Sleep: 3 x per week gets up and uses inhaler / xopenex helps some  SABA use: 4 puffs avg saba  q 24 h  Moved to present house 3 y prior to OV  with basement Rec Augmentin 875 mg take one pill twice daily  X 10 days  Prednisone 10 mg take  4 each am x 2 days,   2 each am x 2 days,  1 each am x 2 days and stop  Singulair 10 mg one each evening  maintenance Only use your levoalbuterol as a rescue medication  Work on inhaler technique:   Lab: Allergy profile 06/07/2021 >  Eos 0.6 /  IgE  63   07/29/2021  f/u ov/Mazie Fencl/ [facility] re: cough variant asthm/ rhinosinusitis   maint on singulair   ? Needs sinus ct  Chief Complaint  Patient presents with   Follow-up    Cough is better however wheezing started aprox 2 weeks ago.    Dyspnea:  power walk x 45 x 5 days a week no trouble  Cough: gone  Sleeping: bed is flat/ one pillow SABA use: started back 2 week prior to OV - did not need it while on pred - wheezing is daytime and sporadic not with activity  02: none  Covid status:   vax x 2    No obvious day to day or daytime variability or assoc excess/ purulent sputum or mucus plugs or hemoptysis or cp or chest tightness,  or overt sinus or hb symptoms.   Sleeping  without nocturnal   or early am exacerbation  of respiratory  c/o's or need for noct saba. Also denies any obvious fluctuation of symptoms with weather or environmental changes or other aggravating or alleviating factors except as outlined above   No unusual exposure hx or h/o childhood pna/ asthma or knowledge of premature birth.  Current Allergies, Complete Past Medical History, Past Surgical History, Family History, and Social History were reviewed in [employer] [facility] record.  ROS  The following are not active complaints unless bolded Hoarseness, sore throat, dysphagia, dental problems, itching, sneezing,  nasal congestion or discharge of excess mucus or purulent secretions, ear ache,   fever, chills, sweats, unintended wt loss or wt gain, classically pleuritic or exertional cp,  orthopnea pnd or arm/hand swelling  or leg swelling, presyncope, palpitations, abdominal pain, anorexia, nausea, vomiting, diarrhea  or change in bowel habits or change in bladder habits, change in stools or change in urine, dysuria, hematuria,  rash, arthralgias, visual complaints, headache, numbness, weakness or ataxia or problems with walking or coordination,  change in mood or  memory.  Current Meds  Medication Sig   albuterol (VENTOLIN HFA) 108 (90 Base) MCG/ACT inhaler INHALE 1-2 PUFFS INTO THE LUNGS EVERY 6 (SIX) HOURS AS NEEDED FOR WHEEZING OR SHORTNESS OF BREATH.   hydrochlorothiazide (HYDRODIURIL) 25 MG tablet Take 1 tablet (25 mg total) by mouth daily.   levalbuterol (XOPENEX HFA) 45 MCG/ACT inhaler INHALE 2 PUFFS INTO THE LUNGS EVERY 6 (SIX) HOURS AS NEEDED FOR WHEEZING.   levonorgestrel (MIRENA, 52 MG,) 20 MCG/24HR IUD Mirena 20 mcg/24 hours (6 yrs) 52 mg intrauterine device  Take 1 device by intrauterine route.   montelukast (SINGULAIR) 10 MG tablet Take 1 tablet (10 mg total) by mouth at bedtime.               Objective:       Wt Readings from Last 3 Encounters:  07/29/21 227 lb (103 kg)   06/07/21 227 lb 3.2 oz (103.1 kg)  05/08/21 231 lb 6.4 oz (105 kg)   Vital signs reviewed  07/29/2021  - Note at rest 02 sats  100% on RA   General appearance:    Pos proptosis and upper airway wheeze      HEENT : pt wearing mask not removed for exam due to covid -19 concerns.    NECK :  without JVD/Nodes/TM/ nl carotid upstrokes bilaterally   LUNGS: no acc muscle use,  Nl contour chest with transmitted upper airway "wheeze" mostly    CV:  RRR  no s3 or murmur or increase in P2, and no edema   ABD:  soft and nontender with nl inspiratory excursion in the supine position. No bruits or organomegaly appreciated, bowel sounds nl  MS:  Nl gait/ ext warm without deformities, calf tenderness, cyanosis or clubbing No obvious joint restrictions   SKIN: warm and dry without lesions    NEURO:  alert, approp, nl sensorium with  no motor or cerebellar deficits apparent.     Assessment

## 2021-07-31 ENCOUNTER — Ambulatory Visit (HOSPITAL_COMMUNITY)
Admission: RE | Admit: 2021-07-31 | Discharge: 2021-07-31 | Disposition: A | Discharge status: Home / Self Care | Payer: 59 | Source: Ambulatory Visit | Attending: Internal Medicine | Admitting: Internal Medicine

## 2021-07-31 ENCOUNTER — Other Ambulatory Visit: Payer: Self-pay

## 2021-07-31 DIAGNOSIS — E010 Iodine-deficiency related diffuse (endemic) goiter: Secondary | ICD-10-CM | POA: Diagnosis not present

## 2021-08-05 ENCOUNTER — Other Ambulatory Visit (HOSPITAL_COMMUNITY): Payer: Self-pay

## 2021-08-07 ENCOUNTER — Other Ambulatory Visit (HOSPITAL_COMMUNITY): Payer: Self-pay

## 2021-08-28 ENCOUNTER — Other Ambulatory Visit (HOSPITAL_COMMUNITY): Payer: Self-pay

## 2021-09-09 ENCOUNTER — Other Ambulatory Visit: Payer: Self-pay

## 2021-09-09 ENCOUNTER — Ambulatory Visit: Payer: 59 | Admitting: Internal Medicine

## 2021-09-09 ENCOUNTER — Encounter: Payer: Self-pay | Admitting: Internal Medicine

## 2021-09-09 VITALS — BP 126/80 | HR 86 | Temp 98.0°F | Resp 16 | Ht 65.0 in | Wt 227.0 lb

## 2021-09-09 DIAGNOSIS — R739 Hyperglycemia, unspecified: Secondary | ICD-10-CM | POA: Diagnosis not present

## 2021-09-09 DIAGNOSIS — I10 Essential (primary) hypertension: Secondary | ICD-10-CM | POA: Diagnosis not present

## 2021-09-09 DIAGNOSIS — R062 Wheezing: Secondary | ICD-10-CM

## 2021-09-09 DIAGNOSIS — R053 Chronic cough: Secondary | ICD-10-CM | POA: Diagnosis not present

## 2021-09-09 DIAGNOSIS — D219 Benign neoplasm of connective and other soft tissue, unspecified: Secondary | ICD-10-CM | POA: Diagnosis not present

## 2021-09-09 LAB — BASIC METABOLIC PANEL
BUN: 10 mg/dL (ref 6–23)
CO2: 29 mEq/L (ref 19–32)
Calcium: 9.9 mg/dL (ref 8.4–10.5)
Chloride: 102 mEq/L (ref 96–112)
Creatinine, Ser: 0.89 mg/dL (ref 0.40–1.20)
GFR: 80.02 mL/min (ref >60.00)
Glucose, Bld: 88 mg/dL (ref 70–99)
Potassium: 3.7 mEq/L (ref 3.5–5.1)
Sodium: 137 mEq/L (ref 135–145)

## 2021-09-09 NOTE — Progress Notes (Signed)
Patient ID: Ariel Perez, female   DOB: Nov 18, 1980, 40 y.o.   MRN: [id number]   Subjective:    Patient ID: Ariel Perez, female    DOB: Jul 08, 1981, 40 y.o.   MRN: [id number]  This visit occurred during the SARS-CoV-2 public health emergency.  Safety protocols were in place, including screening questions prior to the visit, additional usage of staff PPE, and extensive cleaning of exam room while observing appropriate contact time as indicated for disinfecting solutions.   Patient here for a scheduled follow up.   Chief Complaint  Patient presents with   Hypertension   .   HPI Here to follow up regarding her blood pressure and her breathing.  Doing well on hctz.  States outside blood pressures are under control.  Recently received a promotion.  Doing well.  Handling stress.  No chest pain or sob reported.  No abdominal pain reported.  Saw pulmonary.  Currently on singulair and using symbicort.  Has not noticed as much wheezing.  Cough is better.  Recently had normal thyroid ultrasound.     Past Medical History:  Diagnosis Date   Headache    Hypertension    Migraines    Osteoarthritis    Past Surgical History:  Procedure Laterality Date   WISDOM TOOTH EXTRACTION N/A 2008   Approx x4    Family History  Problem Relation Age of Onset   Cancer Father        unknown   Diabetes Paternal Grandmother    Hypertension Maternal Grandmother    Rheum arthritis Maternal Grandmother    Cancer Maternal Grandfather    Social History   Socioeconomic History   Marital status: Single    Spouse name: Not on file   Number of children: 0   Years of education: Not on file   Highest education level: Not on file  Occupational History   Occupation: Financial controller primary care  Tobacco Use   Smoking status: Never   Smokeless tobacco: Never  Vaping Use   Vaping Use: Never used  Substance and Sexual Activity   Alcohol use: Yes    Alcohol/week: 0.0 standard drinks    Comment: occ   Drug  use: No   Sexual activity: Yes    Partners: Male    Comment: 1 partner  Other Topics Concern   Not on file  Social History Narrative   Works here at [employer] by herself    2 dogs live inside    Caffeine- Occasional    Right handed    Enjoys sleeping    Social Determinants of Health   Financial Resource Strain: Not on file  Food Insecurity: Not on file  Transportation Needs: Not on file  Physical Activity: Not on file  Stress: Not on file  Social Connections: Not on file     Review of Systems  Constitutional:  Negative for appetite change and unexpected weight change.  HENT:  Negative for congestion and sinus pressure.   Respiratory:  Negative for cough, chest tightness and shortness of breath.   Cardiovascular:  Negative for chest pain, palpitations and leg swelling.  Gastrointestinal:  Negative for abdominal pain, diarrhea, nausea and vomiting.  Genitourinary:  Negative for difficulty urinating and dysuria.  Musculoskeletal:  Negative for joint swelling and myalgias.  Skin:  Negative for color change and rash.  Neurological:  Negative for dizziness, light-headedness and headaches.  Psychiatric/Behavioral:  Negative for agitation and dysphoric mood.       Objective:  BP 126/80    Pulse 86    Temp 98 F (36.7 C)    Resp 16    Ht 5' 5"  (1.651 m)    Wt 227 lb (103 kg)    SpO2 98%    BMI 37.77 kg/m  Wt Readings from Last 3 Encounters:  09/09/21 227 lb (103 kg)  07/29/21 227 lb (103 kg)  06/07/21 227 lb 3.2 oz (103.1 kg)    Physical Exam Vitals reviewed.  Constitutional:      General: She is not in acute distress.    Appearance: Normal appearance.  HENT:     Head: Normocephalic and atraumatic.     Right Ear: External ear normal.     Left Ear: External ear normal.  Eyes:     General: No scleral icterus.       Right eye: No discharge.        Left eye: No discharge.     Conjunctiva/sclera: Conjunctivae normal.  Neck:     Thyroid: No thyromegaly.   Cardiovascular:     Rate and Rhythm: Normal rate and regular rhythm.  Pulmonary:     Effort: No respiratory distress.     Breath sounds: Normal breath sounds. No wheezing.  Abdominal:     General: Bowel sounds are normal.     Palpations: Abdomen is soft.     Tenderness: There is no abdominal tenderness.  Musculoskeletal:        General: No swelling or tenderness.     Cervical back: Neck supple. No tenderness.  Lymphadenopathy:     Cervical: No cervical adenopathy.  Skin:    Findings: No erythema or rash.  Neurological:     Mental Status: She is alert.  Psychiatric:        Mood and Affect: Mood normal.        Behavior: Behavior normal.     Outpatient Encounter Medications as of 09/09/2021  Medication Sig   albuterol (VENTOLIN HFA) 108 (90 Base) MCG/ACT inhaler INHALE 1-2 PUFFS INTO THE LUNGS EVERY 6 (SIX) HOURS AS NEEDED FOR WHEEZING OR SHORTNESS OF BREATH.   budesonide-formoterol (SYMBICORT) 80-4.5 MCG/ACT inhaler Take 2 puffs first thing in the morning and then another 2 puffs about 12 hours later.   hydrochlorothiazide (HYDRODIURIL) 25 MG tablet Take 1 tablet (25 mg total) by mouth daily.   levonorgestrel (MIRENA, 52 MG,) 20 MCG/24HR IUD Mirena 20 mcg/24 hours (6 yrs) 52 mg intrauterine device  Take 1 device by intrauterine route.   montelukast (SINGULAIR) 10 MG tablet Take 1 tablet (10 mg total) by mouth at bedtime.   No facility-administered encounter medications on file as of 09/09/2021.     Lab Results  Component Value Date   WBC 6.6 06/07/2021   HGB 14.8 06/07/2021   HCT 42.9 06/07/2021   PLT 281 06/07/2021   GLUCOSE 88 09/09/2021   CHOL 149 02/22/2021   TRIG 87.0 02/22/2021   HDL 43.40 02/22/2021   LDLCALC 88 02/22/2021   ALT 11 02/22/2021   AST 16 02/22/2021   NA 137 09/09/2021   K 3.7 09/09/2021   CL 102 09/09/2021   CREATININE 0.89 09/09/2021   BUN 10 09/09/2021   CO2 29 09/09/2021   TSH 1.83 02/22/2021   HGBA1C 5.9 02/22/2021    US  THYROID  Result Date: 07/31/2021 CLINICAL DATA:  Palpable abnormality. Thyromegaly on physical examination. EXAM: THYROID ULTRASOUND TECHNIQUE: Ultrasound examination of the thyroid gland and adjacent soft tissues was performed. COMPARISON:  None. FINDINGS: Parenchymal  Echotexture: Normal - no definitive evidence of glandular hyperemia. Isthmus: Normal in size measuring 0.4 cm in diameter Right lobe: Normal in size measuring 4.9 x 1.8 x 1.5 cm Left lobe: Normal in size measuring 4.7 x 1.5 x 1.6 cm _________________________________________________________ Estimated total number of nodules >/= 1 cm: 0 Number of spongiform nodules >/=  2 cm not described below (TR1): 0 Number of mixed cystic and solid nodules >/= 1.5 cm not described below (TR2): 0 _________________________________________________________ No discrete nodules are seen within the thyroid gland. No regional cervical lymphadenopathy. IMPRESSION: Normal thyroid ultrasound. Specifically, no evidence of thyromegaly or discrete thyroid nodule or mass. Electronically Signed   By: Sandi Mariscal M.D.   On: 07/31/2021 08:45       Assessment & Plan:   Problem List Items Addressed This Visit     Essential hypertension - Primary    Blood pressure better on hctz 69m q day.  Continue to follow pressures.  Follow metabolic panel.        Relevant Orders   Basic metabolic panel (Completed)   Fibroids    Saw Dr AJulien Girtgyn. Pelvic ultrasound - two fibroids.  GYN - pelvic and pap smears.  States has f/u soon to reassess.       Hyperglycemia    Low carb diet and exercise. Will follow met b and a1c.       Persistent cough    Has improved on current regimen - singulair and symbicort.  Follow.        Wheezing    Using symbicort and on singulair.  Wheezing is better.  Cough is better.  Follow.          CEinar Pheasant MD

## 2021-09-10 ENCOUNTER — Encounter: Payer: Self-pay | Admitting: Internal Medicine

## 2021-09-10 NOTE — Assessment & Plan Note (Signed)
Saw Dr Julien Girt gyn. Pelvic ultrasound - two fibroids.  GYN - pelvic and pap smears.  States has f/u soon to reassess.

## 2021-09-10 NOTE — Assessment & Plan Note (Signed)
Low carb diet and exercise. Will follow met b and a1c.

## 2021-09-10 NOTE — Assessment & Plan Note (Signed)
Using symbicort and on singulair.  Wheezing is better.  Cough is better.  Follow.

## 2021-09-10 NOTE — Assessment & Plan Note (Signed)
Blood pressure better on hctz 25mg q day.  Continue to follow pressures.  Follow metabolic panel.   

## 2021-09-10 NOTE — Assessment & Plan Note (Signed)
Has improved on current regimen - singulair and symbicort.  Follow.

## 2021-09-20 ENCOUNTER — Other Ambulatory Visit (HOSPITAL_COMMUNITY): Payer: Self-pay

## 2021-09-27 ENCOUNTER — Other Ambulatory Visit: Payer: Self-pay

## 2021-09-27 ENCOUNTER — Encounter: Payer: Self-pay | Admitting: Primary Care

## 2021-09-27 ENCOUNTER — Ambulatory Visit: Payer: 59 | Admitting: Primary Care

## 2021-09-27 DIAGNOSIS — J45991 Cough variant asthma: Secondary | ICD-10-CM

## 2021-09-27 DIAGNOSIS — R490 Dysphonia: Secondary | ICD-10-CM | POA: Diagnosis not present

## 2021-09-27 NOTE — Progress Notes (Signed)
@Patient  ID: Ariel Perez, female    DOB: 10/21/1980, 41 y.o.   MRN: [id number]  Chief Complaint  Patient presents with   Follow-up    Referring provider: Einar Pheasant, MD  HPI: 41 year old female, never smoked. PMH significant for HTN, cough variant asthma, thyromegaly. Patient of Dr. Melvyn Novas, last seen in November 2022. She had HST in 2017.   09/27/2021 Patient presents today for 6 week follow-up. She is doing well without acute complaints. She has noticed improvement in wheezing and cough with addition of Symbicort 32mcg two puffs BID. She has new voice hoarseness since starting maintenance inhaler. She used spacer today. She is taking Singulair as prescribed. She has not needed to use albuterol. Thyroid u/s was negative for thyroidmegaly.     Allergies  Allergen Reactions   Garcinia Mangostana Extract [Garcinia Cambogia]     Itchy throat     Immunization History  Administered Date(s) Administered   Influenza,inj,Quad PF,6+ Mos 06/14/2018   Influenza-Unspecified 08/23/2011, 07/07/2013, 06/27/2016, 06/16/2017, 07/06/2019, 06/21/2020   PFIZER(Purple Top)SARS-COV-2 Vaccination 10/10/2019, 10/31/2019, 09/11/2020   Td 04/22/2021   Tdap 09/22/2009    Past Medical History:  Diagnosis Date   Headache    Hypertension    Migraines    Osteoarthritis     Tobacco History: Social History   Tobacco Use  Smoking Status Never  Smokeless Tobacco Never   Counseling given: Not Answered   Outpatient Medications Prior to Visit  Medication Sig Dispense Refill   albuterol (VENTOLIN HFA) 108 (90 Base) MCG/ACT inhaler INHALE 1-2 PUFFS INTO THE LUNGS EVERY 6 (SIX) HOURS AS NEEDED FOR WHEEZING OR SHORTNESS OF BREATH. 18 g 0   budesonide-formoterol (SYMBICORT) 80-4.5 MCG/ACT inhaler Take 2 puffs first thing in the morning and then another 2 puffs about 12 hours later. 10.2 g 12   hydrochlorothiazide (HYDRODIURIL) 25 MG tablet Take 1 tablet (25 mg total) by mouth daily. 90 tablet 1    levonorgestrel (MIRENA, 52 MG,) 20 MCG/24HR IUD Mirena 20 mcg/24 hours (6 yrs) 52 mg intrauterine device  Take 1 device by intrauterine route.     montelukast (SINGULAIR) 10 MG tablet Take 1 tablet (10 mg total) by mouth at bedtime. 30 tablet 11   No facility-administered medications prior to visit.   Review of Systems  Review of Systems  HENT:  Positive for voice change.   Respiratory:  Negative for cough, chest tightness, shortness of breath and wheezing.     Physical Exam  BP 120/80 (BP Location: Left Arm, Patient Position: Sitting, Cuff Size: Normal)    Pulse 73    Temp (!) 97.3 F (36.3 C) (Oral)    Ht 5\' 5"  (1.651 m)    Wt 223 lb 12.8 oz (101.5 kg)    SpO2 100%    BMI 37.24 kg/m  Physical Exam Constitutional:      Appearance: Normal appearance.  HENT:     Mouth/Throat:     Mouth: Mucous membranes are moist.     Pharynx: Oropharynx is clear.     Comments: No thrush symptoms  Cardiovascular:     Rate and Rhythm: Normal rate and regular rhythm.  Pulmonary:     Effort: Pulmonary effort is normal.     Breath sounds: Normal breath sounds. No wheezing, rhonchi or rales.  Musculoskeletal:        Perez: Normal range of motion.     Cervical back: Normal range of motion and neck supple. No tenderness.  Lymphadenopathy:  Cervical: No cervical adenopathy.  Skin:    Perez: Skin is warm and dry.  Neurological:     Perez: No focal deficit present.     Mental Status: She is alert and oriented to person, place, and time. Mental status is at baseline.  Psychiatric:        Mood and Affect: Mood normal.        Behavior: Behavior normal.        Thought Content: Thought content normal.        Judgment: Judgment normal.     Lab Results:  CBC    Component Value Date/Time   WBC 6.6 06/07/2021 1215   RBC 4.83 06/07/2021 1215   HGB 14.8 06/07/2021 1215   HCT 42.9 06/07/2021 1215   PLT 281 06/07/2021 1215   MCV 88.8 06/07/2021 1215   MCH 30.6 06/07/2021 1215   MCHC  34.5 06/07/2021 1215   RDW 13.5 06/07/2021 1215   LYMPHSABS 2.4 06/07/2021 1215   MONOABS 0.4 06/07/2021 1215   EOSABS 0.7 (H) 06/07/2021 1215   BASOSABS 0.1 06/07/2021 1215    BMET    Component Value Date/Time   NA 137 09/09/2021 1054   NA 135 (A) 10/09/2011 0000   K 3.7 09/09/2021 1054   CL 102 09/09/2021 1054   CO2 29 09/09/2021 1054   GLUCOSE 88 09/09/2021 1054   BUN 10 09/09/2021 1054   BUN 15 10/09/2011 0000   CREATININE 0.89 09/09/2021 1054   CALCIUM 9.9 09/09/2021 1054    BNP No results found for: BNP  ProBNP No results found for: PROBNP  Imaging: No results found.   Assessment & Plan:   Cough variant asthma - Patient reports improvement in cough and wheezing since adding ICS/LABA - Continue Symbicort 59mcg 2 puffs morning and evening ( use with spacer and rinse mouth after use); albuterol rescue inhaler 1-2 puffs every 4-6 hours for breakthrough sob/wheezing/cough symptoms - Continue Singulair 10mg  at bedtime - Follow-up 6 months with Dr. Melvyn Novas or sooner if needed   Voice hoarseness - Likely from ICS in maintenance inhaler. Recommend using with spacer and rinsing mouth after use. If voice hoarseness persists or patient develops thrush advised she contact office - would refer to ENT for evaluation.    Martyn Ehrich, NP 10/29/2021

## 2021-09-27 NOTE — Patient Instructions (Signed)
Recommendations: - Continue Symbicort 72mcg 2 puffs morning and evening ( use with spacer and rinse mouth after use) - Use albuterol rescue inhaler 1-2 puffs every 4-6 hours for breakthrough sob/wheezing/cough symptoms - Continue Singulair 10mg  at bedtime - If voice hoarseness persists or you develop thrush please contact office   Follow-up: - 6 months with Dr. Melvyn Novas or sooner if needed

## 2021-10-29 DIAGNOSIS — R490 Dysphonia: Secondary | ICD-10-CM | POA: Insufficient documentation

## 2021-10-29 NOTE — Assessment & Plan Note (Signed)
-   Patient reports improvement in cough and wheezing since adding ICS/LABA - Continue Symbicort 18mcg 2 puffs morning and evening ( use with spacer and rinse mouth after use); albuterol rescue inhaler 1-2 puffs every 4-6 hours for breakthrough sob/wheezing/cough symptoms - Continue Singulair 10mg  at bedtime - Follow-up 6 months with Dr. Melvyn Novas or sooner if needed

## 2021-10-29 NOTE — Assessment & Plan Note (Signed)
-   Likely from ICS in maintenance inhaler. Recommend using with spacer and rinsing mouth after use. If voice hoarseness persists or patient develops thrush advised she contact office - would refer to ENT for evaluation.

## 2021-11-06 ENCOUNTER — Other Ambulatory Visit: Payer: Self-pay | Admitting: Obstetrics and Gynecology

## 2021-11-06 DIAGNOSIS — N6310 Unspecified lump in the right breast, unspecified quadrant: Secondary | ICD-10-CM

## 2021-11-06 DIAGNOSIS — Z124 Encounter for screening for malignant neoplasm of cervix: Secondary | ICD-10-CM | POA: Diagnosis not present

## 2021-11-06 DIAGNOSIS — N6320 Unspecified lump in the left breast, unspecified quadrant: Secondary | ICD-10-CM

## 2021-11-06 DIAGNOSIS — Z6836 Body mass index (BMI) 36.0-36.9, adult: Secondary | ICD-10-CM | POA: Diagnosis not present

## 2021-11-06 DIAGNOSIS — Z1151 Encounter for screening for human papillomavirus (HPV): Secondary | ICD-10-CM | POA: Diagnosis not present

## 2021-11-06 DIAGNOSIS — Z01419 Encounter for gynecological examination (general) (routine) without abnormal findings: Secondary | ICD-10-CM | POA: Diagnosis not present

## 2021-11-07 DIAGNOSIS — Z23 Encounter for immunization: Secondary | ICD-10-CM | POA: Diagnosis not present

## 2021-11-20 ENCOUNTER — Other Ambulatory Visit (HOSPITAL_COMMUNITY): Payer: Self-pay

## 2021-11-29 ENCOUNTER — Ambulatory Visit: Payer: 59

## 2021-11-29 ENCOUNTER — Ambulatory Visit
Admission: RE | Admit: 2021-11-29 | Discharge: 2021-11-29 | Disposition: A | Discharge status: Home / Self Care | Payer: 59 | Source: Ambulatory Visit | Attending: Obstetrics and Gynecology | Admitting: Obstetrics and Gynecology

## 2021-11-29 DIAGNOSIS — R922 Inconclusive mammogram: Secondary | ICD-10-CM | POA: Diagnosis not present

## 2021-11-29 DIAGNOSIS — N6320 Unspecified lump in the left breast, unspecified quadrant: Secondary | ICD-10-CM

## 2022-01-07 ENCOUNTER — Encounter: Payer: Self-pay | Admitting: Internal Medicine

## 2022-01-07 ENCOUNTER — Ambulatory Visit (INDEPENDENT_AMBULATORY_CARE_PROVIDER_SITE_OTHER): Payer: 59 | Admitting: Internal Medicine

## 2022-01-07 VITALS — BP 130/78 | HR 95 | Temp 97.6°F | Resp 17 | Ht 65.0 in | Wt 224.4 lb

## 2022-01-07 DIAGNOSIS — G43809 Other migraine, not intractable, without status migrainosus: Secondary | ICD-10-CM | POA: Diagnosis not present

## 2022-01-07 DIAGNOSIS — R928 Other abnormal and inconclusive findings on diagnostic imaging of breast: Secondary | ICD-10-CM

## 2022-01-07 DIAGNOSIS — J45991 Cough variant asthma: Secondary | ICD-10-CM | POA: Diagnosis not present

## 2022-01-07 DIAGNOSIS — R739 Hyperglycemia, unspecified: Secondary | ICD-10-CM

## 2022-01-07 DIAGNOSIS — I10 Essential (primary) hypertension: Secondary | ICD-10-CM

## 2022-01-07 DIAGNOSIS — D219 Benign neoplasm of connective and other soft tissue, unspecified: Secondary | ICD-10-CM

## 2022-01-07 LAB — LIPID PANEL
Cholesterol: 151 mg/dL (ref 0–200)
HDL: 46.1 mg/dL (ref >39.00)
LDL Cholesterol: 91 mg/dL (ref 0–99)
NonHDL: 104.81
Total CHOL/HDL Ratio: 3
Triglycerides: 69 mg/dL (ref 0.0–149.0)
VLDL: 13.8 mg/dL (ref 0.0–40.0)

## 2022-01-07 LAB — BASIC METABOLIC PANEL
BUN: 11 mg/dL (ref 6–23)
CO2: 30 mEq/L (ref 19–32)
Calcium: 9.8 mg/dL (ref 8.4–10.5)
Chloride: 98 mEq/L (ref 96–112)
Creatinine, Ser: 1 mg/dL (ref 0.40–1.20)
GFR: 69.42 mL/min (ref >60.00)
Glucose, Bld: 94 mg/dL (ref 70–99)
Potassium: 3.7 mEq/L (ref 3.5–5.1)
Sodium: 136 mEq/L (ref 135–145)

## 2022-01-07 LAB — HEPATIC FUNCTION PANEL
ALT: 12 U/L (ref 0–35)
AST: 16 U/L (ref 0–37)
Albumin: 4.6 g/dL (ref 3.5–5.2)
Alkaline Phosphatase: 59 U/L (ref 39–117)
Bilirubin, Direct: 0.2 mg/dL (ref 0.0–0.3)
Total Bilirubin: 0.8 mg/dL (ref 0.2–1.2)
Total Protein: 7.9 g/dL (ref 6.0–8.3)

## 2022-01-07 LAB — CORTISOL: Cortisol, Plasma: 6.2 ug/dL

## 2022-01-07 LAB — TSH: TSH: 1.55 u[IU]/mL (ref 0.35–5.50)

## 2022-01-07 LAB — FOLLICLE STIMULATING HORMONE: FSH: 2.4 m[IU]/mL

## 2022-01-07 LAB — HEMOGLOBIN A1C: Hgb A1c MFr Bld: 5.9 % (ref 4.6–6.5)

## 2022-01-07 NOTE — Progress Notes (Signed)
Patient ID: Ariel Perez, female   DOB: Mar 28, 1981, 41 y.o.   MRN: [id number] ? ? ?Subjective:  ? ? Patient ID: Ariel Perez, female    DOB: Jul 04, 1981, 41 y.o.   MRN: [id number] ? ?This visit occurred during the SARS-CoV-2 public health emergency.  Safety protocols were in place, including screening questions prior to the visit, additional usage of staff PPE, and extensive cleaning of exam room while observing appropriate contact time as indicated for disinfecting solutions.  ? ? ?HPI ?Was scheduled for physical.  Gets breasts, pelvic and pap smears through gyn. Appt changed to f/u appt.  Here to f/u on her blood pressure and breathing.  Has seen pulmonary.  Was started on symbicort.  Felt helped symptoms.  Concerned regarding eye issues.  Not using symbicort now.  She is taking singulair and zyrtec and using flonase.  Has albuterol - uses prn.  Still with intermittent wheezing.  Request appt [facility] [place].  She is exercising.  Watching diet.  No chest pain or sob reported with increased activity.  No acid reflux.  No abdominal pain reported.  Request to have "hormones checked".   ? ? ?Past Medical History:  ?Diagnosis Date  ? Headache   ? Hypertension   ? Migraines   ? Osteoarthritis   ? ?Past Surgical History:  ?Procedure Laterality Date  ? WISDOM TOOTH EXTRACTION N/A 2008  ? Approx x4   ? ?Family History  ?Problem Relation Age of Onset  ? Cancer Father   ?     unknown  ? Diabetes Paternal Grandmother   ? Hypertension Maternal Grandmother   ? Rheum arthritis Maternal Grandmother   ? Cancer Maternal Grandfather   ? ?Social History  ? ?Socioeconomic History  ? Marital status: Single  ?  Spouse name: Not on file  ? Number of children: 0  ? Years of education: Not on file  ? Highest education level: Not on file  ?Occupational History  ? Occupation: Financial controller primary care  ?Tobacco Use  ? Smoking status: Never  ? Smokeless tobacco: Never  ?Vaping Use  ? Vaping Use: Never used  ?Substance and Sexual  Activity  ? Alcohol use: Yes  ?  Alcohol/week: 0.0 standard drinks  ?  Comment: occ  ? Drug use: No  ? Sexual activity: Yes  ?  Partners: Male  ?  Comment: 1 partner  ?Other Topics Concern  ? Not on file  ?Social History Narrative  ? Works here at [employer]  ? Lives by herself   ? 2 dogs live inside   ? Caffeine- Occasional   ? Right handed   ? Enjoys sleeping   ? ?Social Determinants of Health  ? ?Financial Resource Strain: Not on file  ?Food Insecurity: Not on file  ?Transportation Needs: Not on file  ?Physical Activity: Not on file  ?Stress: Not on file  ?Social Connections: Not on file  ? ? ? ?Review of Systems  ?Constitutional:  Negative for appetite change and unexpected weight change.  ?HENT:  Negative for congestion and sinus pressure.   ?Respiratory:  Positive for wheezing. Negative for cough, chest tightness and shortness of breath.   ?Cardiovascular:  Negative for chest pain, palpitations and leg swelling.  ?Gastrointestinal:  Negative for abdominal pain, diarrhea, nausea and vomiting.  ?Genitourinary:  Negative for difficulty urinating and dysuria.  ?Musculoskeletal:  Negative for joint swelling and myalgias.  ?Skin:  Negative for color change and rash.  ?Neurological:  Negative for dizziness, light-headedness  and headaches.  ?Psychiatric/Behavioral:  Negative for agitation and dysphoric mood.   ? ?   ?Objective:  ?  ? ?BP 130/78 (BP Location: Left Arm, Patient Position: Sitting, Cuff Size: Large)   Pulse 95   Temp 97.6 ?F (36.4 ?C) (Temporal)   Resp 17   Ht '5\' 5"'$  (1.651 m)   Wt 224 lb 6.4 oz (101.8 kg)   SpO2 96%   BMI 37.34 kg/m?  ?Wt Readings from Last 3 Encounters:  ?01/07/22 224 lb 6.4 oz (101.8 kg)  ?09/27/21 223 lb 12.8 oz (101.5 kg)  ?09/09/21 227 lb (103 kg)  ? ? ?Physical Exam ?Vitals reviewed.  ?Constitutional:   ?   General: She is not in acute distress. ?   Appearance: Normal appearance.  ?HENT:  ?   Head: Normocephalic and atraumatic.  ?   Right Ear: External ear normal.  ?   Left Ear:  External ear normal.  ?Eyes:  ?   General: No scleral icterus.    ?   Right eye: No discharge.     ?   Left eye: No discharge.  ?   Conjunctiva/sclera: Conjunctivae normal.  ?Neck:  ?   Thyroid: No thyromegaly.  ?Cardiovascular:  ?   Rate and Rhythm: Normal rate and regular rhythm.  ?Pulmonary:  ?   Effort: No respiratory distress.  ?   Breath sounds: Normal breath sounds.  ?   Comments: Minimal wheezing ?Abdominal:  ?   General: Bowel sounds are normal.  ?   Palpations: Abdomen is soft.  ?   Tenderness: There is no abdominal tenderness.  ?   Comments: Palpable fullness on exam - stable - pt followed by gyn.   ?Musculoskeletal:     ?   General: No swelling or tenderness.  ?   Cervical back: Neck supple. No tenderness.  ?Lymphadenopathy:  ?   Cervical: No cervical adenopathy.  ?Skin: ?   Findings: No erythema or rash.  ?Neurological:  ?   Mental Status: She is alert.  ?Psychiatric:     ?   Mood and Affect: Mood normal.     ?   Behavior: Behavior normal.  ? ? ? ?Outpatient Encounter Medications as of 01/07/2022  ?Medication Sig  ? budesonide-formoterol (SYMBICORT) 80-4.5 MCG/ACT inhaler Take 2 puffs first thing in the morning and then another 2 puffs about 12 hours later.  ? hydrochlorothiazide (HYDRODIURIL) 25 MG tablet Take 1 tablet (25 mg total) by mouth daily.  ? levonorgestrel (MIRENA, 52 MG,) 20 MCG/24HR IUD Mirena 20 mcg/24 hours (6 yrs) 52 mg intrauterine device ? Take 1 device by intrauterine route.  ? montelukast (SINGULAIR) 10 MG tablet Take 1 tablet (10 mg total) by mouth at bedtime.  ? albuterol (VENTOLIN HFA) 108 (90 Base) MCG/ACT inhaler INHALE 1-2 PUFFS INTO THE LUNGS EVERY 6 (SIX) HOURS AS NEEDED FOR WHEEZING OR SHORTNESS OF BREATH.  ? ?No facility-administered encounter medications on file as of 01/07/2022.  ?  ? ?Lab Results  ?Component Value Date  ? WBC 6.6 06/07/2021  ? HGB 14.8 06/07/2021  ? HCT 42.9 06/07/2021  ? PLT 281 06/07/2021  ? GLUCOSE 94 01/07/2022  ? CHOL 151 01/07/2022  ? TRIG 69.0  01/07/2022  ? HDL 46.10 01/07/2022  ? Peters 91 01/07/2022  ? ALT 12 01/07/2022  ? AST 16 01/07/2022  ? NA 136 01/07/2022  ? K 3.7 01/07/2022  ? CL 98 01/07/2022  ? CREATININE 1.00 01/07/2022  ? BUN 11 01/07/2022  ? CO2  30 01/07/2022  ? TSH 1.55 01/07/2022  ? HGBA1C 5.9 01/07/2022  ? ? ?MM DIAG BREAST TOMO BILATERAL ? ?Result Date: 11/29/2021 ?CLINICAL DATA:  Follow-up probably benign left breast mass with no previous ultrasound correlate. EXAM: DIGITAL DIAGNOSTIC BILATERAL MAMMOGRAM WITH TOMOSYNTHESIS AND CAD TECHNIQUE: Bilateral digital diagnostic mammography and breast tomosynthesis was performed. The images were evaluated with computer-aided detection. COMPARISON:  Previous exam(s). ACR Breast Density Category c: The breast tissue is heterogeneously dense, which may obscure small masses. FINDINGS: The cyst seen in the 9:30 o'clock position of the right breast 11/12/2020 is no longer visualized. The previously demonstrated oval, circumscribed mass in the posteromedial left breast, containing a coarse calcification, has not changed significantly since 11/12/2020 and 11/11/2019. Interval findings suspicious for malignancy in either breast. IMPRESSION: 1. The previously demonstrated probably benign mass in the posteromedial left breast has not changed significantly in more than 2 years, compatible with a benign mass. This does not need further follow-up. 2. No evidence of malignancy in either breast. RECOMMENDATION: Bilateral screening mammogram in 1 year. I have discussed the findings and recommendations with the patient. If applicable, a reminder letter will be sent to the patient regarding the next appointment. BI-RADS CATEGORY  2: Benign. Electronically Signed   By: Claudie Revering M.D.   On: 11/29/2021 10:59  ? ? ?   ?Assessment & Plan:  ? ?Problem List Items Addressed This Visit   ? ? Abnormal mammogram  ?  Mammogram 11/12/20 - Birads III.  Recommended f/u diagnostic mammogram and possible left breast ultrasound  in one year.  Mammogram 11/29/21 - The previously demonstrated probably benign mass in the posteromedial left breast has not changed significantly in more than 2 years, compatible with a benign mass. This does no

## 2022-01-08 ENCOUNTER — Encounter: Payer: Self-pay | Admitting: Internal Medicine

## 2022-01-08 ENCOUNTER — Telehealth: Payer: Self-pay | Admitting: Internal Medicine

## 2022-01-08 NOTE — Assessment & Plan Note (Signed)
Blood pressure better on hctz 25mg q day.  Continue to follow pressures.  Follow metabolic panel.   

## 2022-01-08 NOTE — Assessment & Plan Note (Signed)
Saw pulmonary.  Started on symbicort.  Some improvement documented.  Off now.  Concerned regarding eye issues.  Some minimal wheezing on exam.  Still notices intermittent wheezing. No sob reported.  Exercising.  Discussed using albuterol inhaler.  Request referral to [facility] in [place] - Dr Elsworth Soho.  She continues on singulair and zyrtec.   ?

## 2022-01-08 NOTE — Telephone Encounter (Signed)
I called pulmonary and explained Ariel Perez wanted to see Dr Elsworth Soho.  States will have pulmonary review.  To call back with decision.  Pt notified via my chart of above.  ?

## 2022-01-08 NOTE — Assessment & Plan Note (Signed)
Saw Dr Julien Girt gyn. Pelvic ultrasound - two fibroids.  GYN - pelvic and pap smears.  Reports stable. Continue f/u with gyn.  ?

## 2022-01-08 NOTE — Assessment & Plan Note (Signed)
Low carb diet and exercise. Will follow met b and a1c.  ?

## 2022-01-08 NOTE — Assessment & Plan Note (Signed)
Stable.  Follow.  Overall doing well.  ?

## 2022-01-08 NOTE — Telephone Encounter (Signed)
Fine with me, should bring all active meds/ inhalers with her ?

## 2022-01-08 NOTE — Telephone Encounter (Signed)
Ariel Perez has been seen at [facility] here in [place].  She lives in [place].  She request to see Dr Elsworth Soho - pulmonary in [place].  Can we schedule her an appt.  Does she need a new referral?  Just let me know what we need to do.   ?

## 2022-01-08 NOTE — Assessment & Plan Note (Signed)
Mammogram 11/12/20 - Birads III.  Recommended f/u diagnostic mammogram and possible left breast ultrasound in one year.  Mammogram 11/29/21 - The previously demonstrated probably benign mass in the posteromedial left breast has not changed significantly in more than 2 years, compatible with a benign mass. This does not need further follow-up.  No evidence of malignancy in either breast. ?? ?RECOMMENDATION: ?Bilateral screening mammogram in 1 year. ?

## 2022-01-08 NOTE — Telephone Encounter (Signed)
Dr. Nicki Reaper states patient would like to switch to Dr. Elsworth Soho. Dr. Nicki Reaper phone number is [phone]. Patient needs appointment. Patient phone number is [phone].  ? ?Dr. Melvyn Novas and Dr. Elsworth Soho please advise if youre both okay with this. This is a [place] patient. Thanks!  ?

## 2022-01-09 NOTE — Telephone Encounter (Signed)
ATC patient to ask if she would be willing to see someone besides Dr. Elsworth Soho. LMTCB ?

## 2022-01-09 NOTE — Telephone Encounter (Signed)
Find out what clinic is closest to her work or home and she can go to next available if doesn't want to return to see me ?

## 2022-01-10 NOTE — Telephone Encounter (Signed)
Called and spoke with pt letting her know that MW said he was okay with her switching and stated to her that Dr. Bari Mantis next avail for new pt is not until 6/1 and she said that was fine. Appt has been scheduled for pt with Dr. Elsworth Soho 6/1. Nothing further needed. ?

## 2022-01-31 IMAGING — US US THYROID
1 series · 14 of 25 positions shown · non-contrast
Comparison: None.

CLINICAL DATA: Palpable abnormality. Thyromegaly on physical
examination.

EXAM:
THYROID ULTRASOUND
TECHNIQUE: Ultrasound examination of the thyroid gland and adjacent soft
tissues was performed.

[Series 1: us thyroid · 14 of 54 slices shown]
[im 1/54]
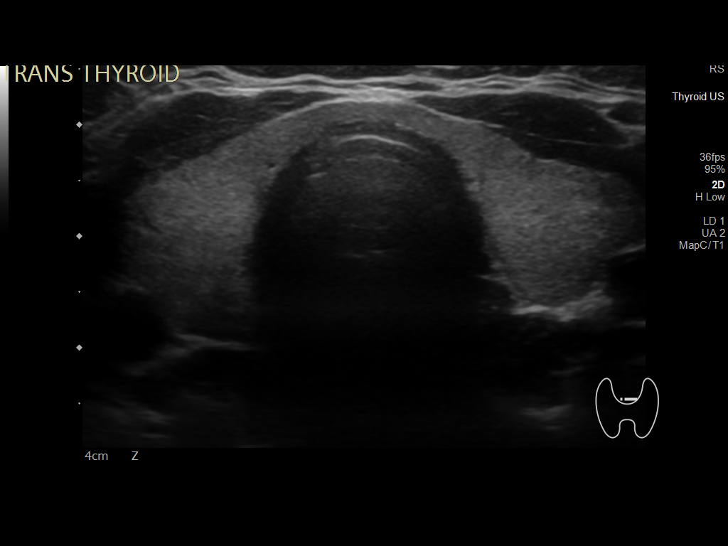
[im 5/54]
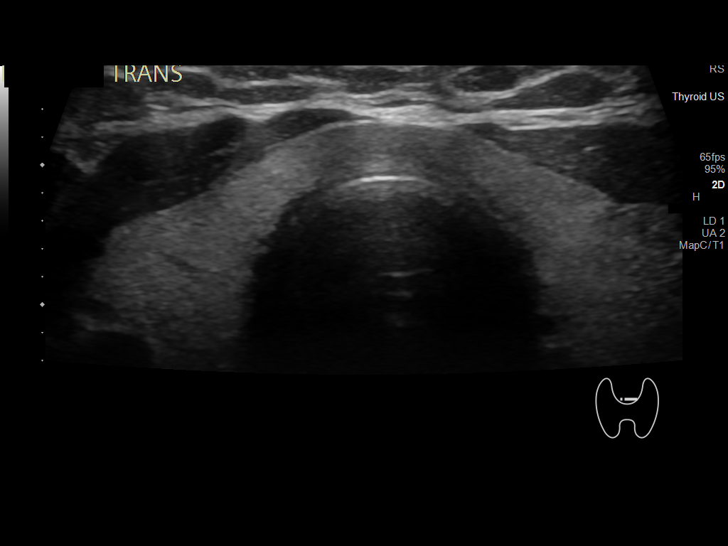
[im 9/54]
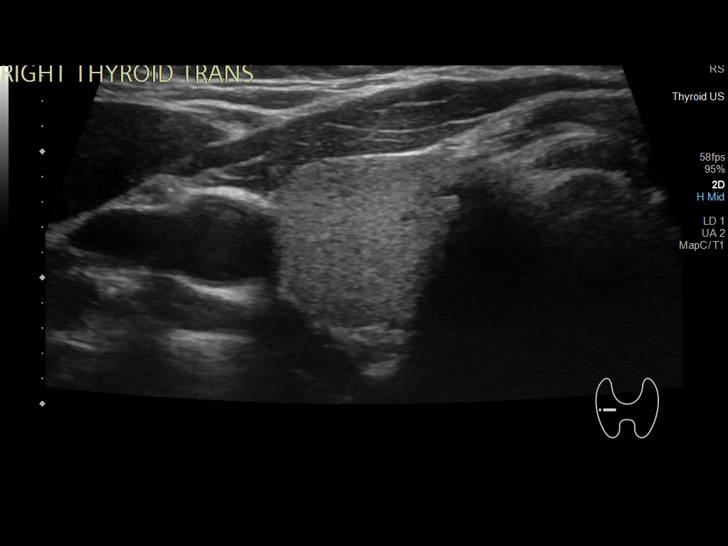
[im 14/54]
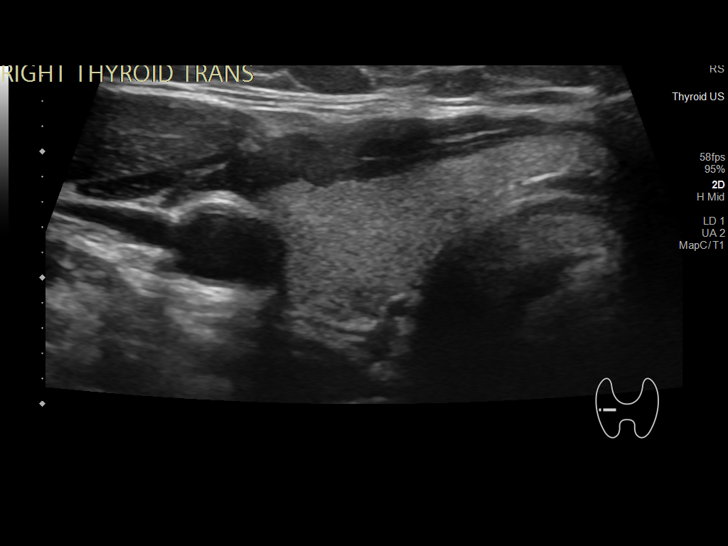
[im 18/54]
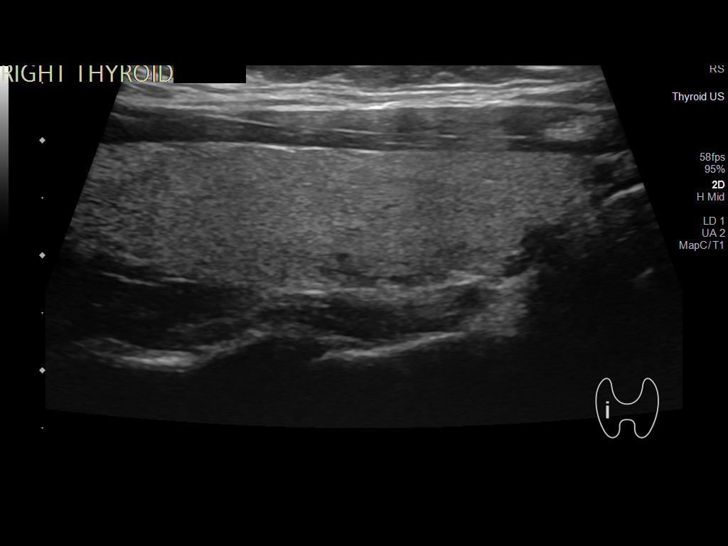
[im 20/54]
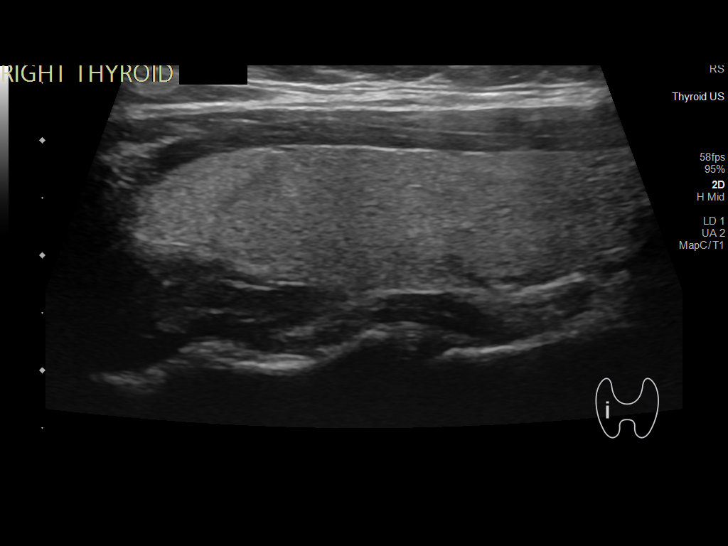
[im 25/54]
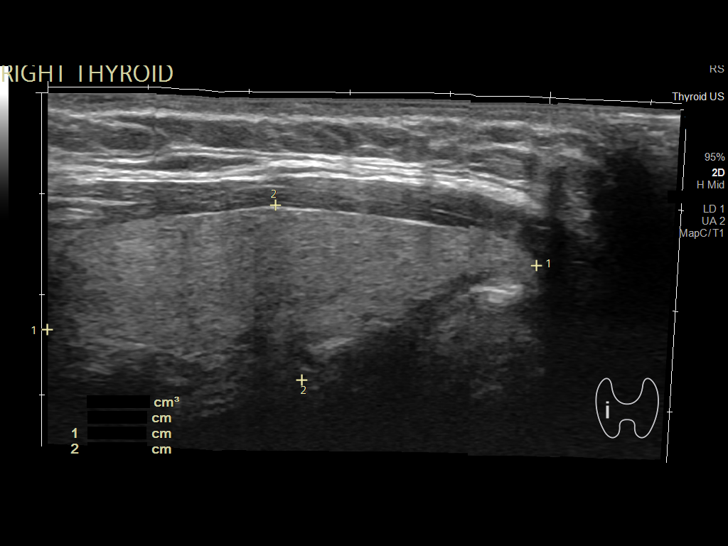
[im 29/54]
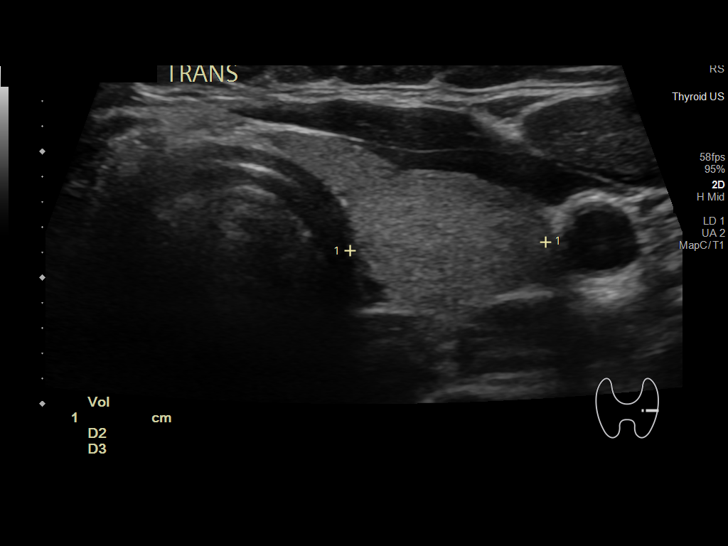
[im 34/54]
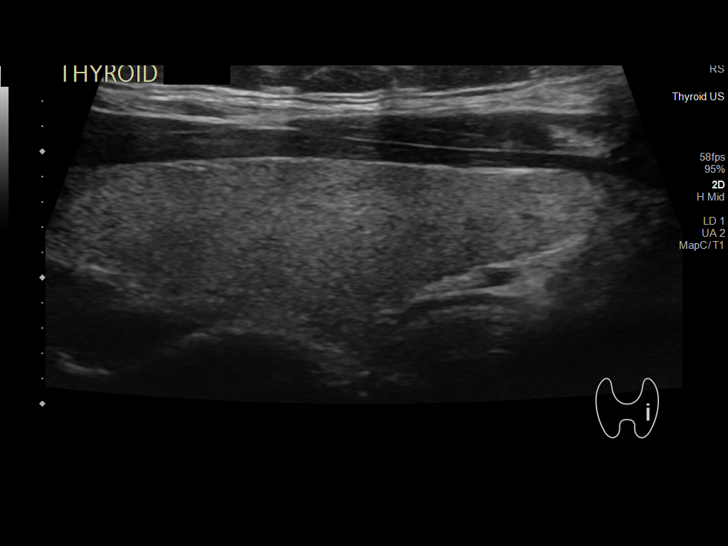
[im 36/54]
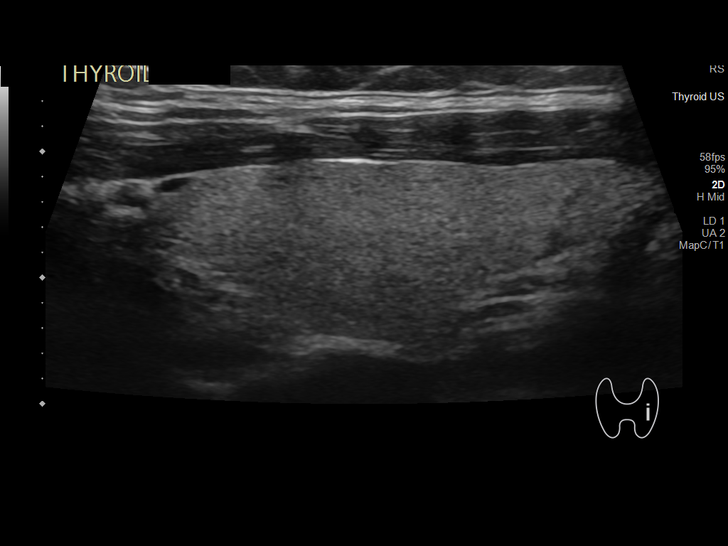
[im 40/54]
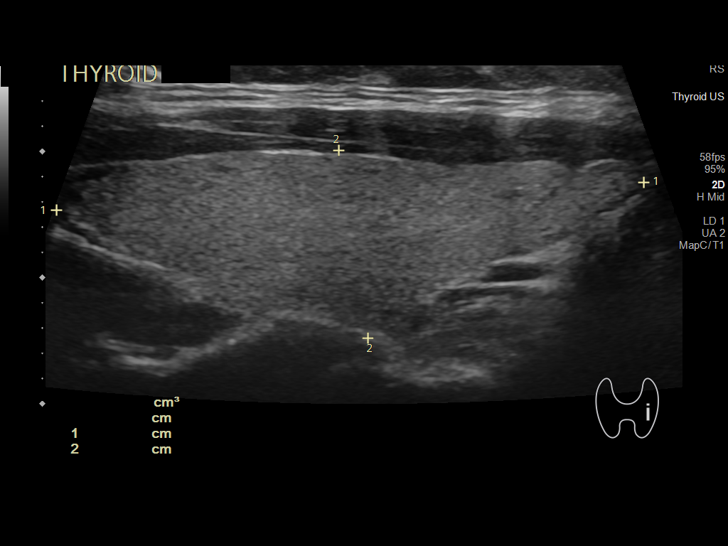
[im 45/54]
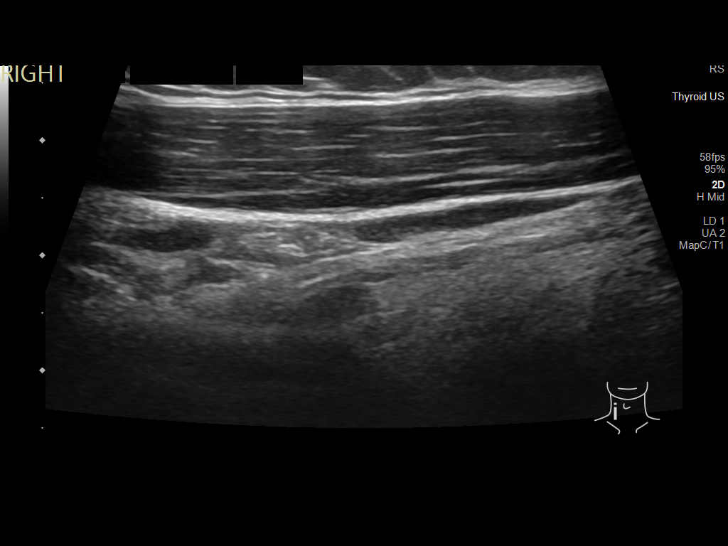
[im 49/54]
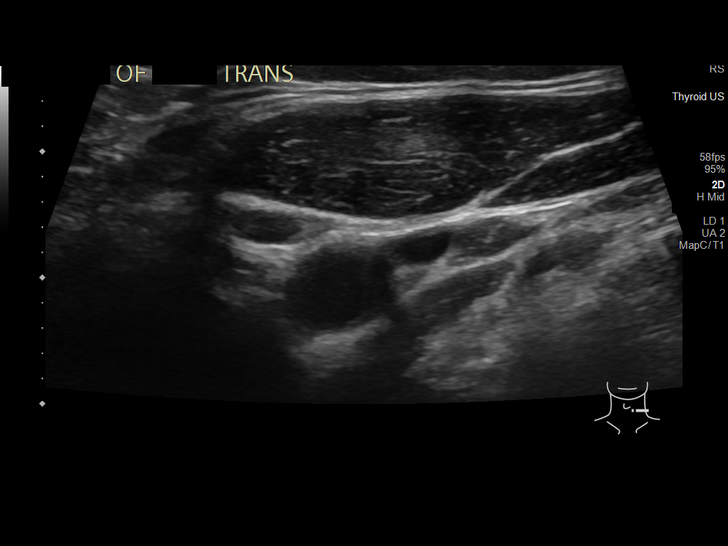
[im 54/54]
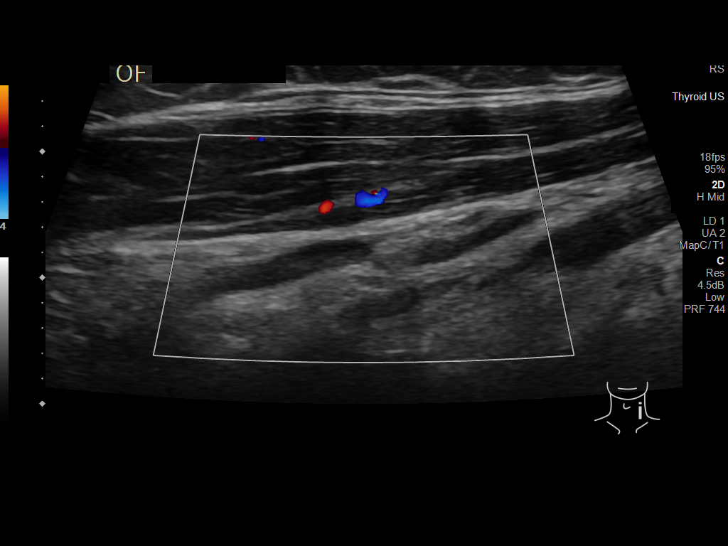

[14 of 25 positions shown; findings below may reference images not displayed]

FINDINGS: Parenchymal Echotexture: Normal - no definitive evidence of
glandular hyperemia.

Isthmus: Normal in size measuring 0.4 cm in diameter

Right lobe: Normal in size measuring 4.9 x 1.8 x 1.5 cm

Left lobe: Normal in size measuring 4.7 x 1.5 x 1.6 cm

_________________________________________________________

Estimated total number of nodules >/= 1 cm: 0

Number of spongiform nodules >/=  2 cm not described below (TR1): 0

Number of mixed cystic and solid nodules >/= 1.5 cm not described
below (TR2): 0

_________________________________________________________

No discrete nodules are seen within the thyroid gland.

No regional cervical lymphadenopathy.
IMPRESSION: Normal thyroid ultrasound. Specifically, no evidence of thyromegaly
or discrete thyroid nodule or mass.

## 2022-02-12 ENCOUNTER — Other Ambulatory Visit: Payer: Self-pay | Admitting: Internal Medicine

## 2022-02-12 ENCOUNTER — Other Ambulatory Visit (HOSPITAL_COMMUNITY): Payer: Self-pay

## 2022-02-13 ENCOUNTER — Other Ambulatory Visit (HOSPITAL_COMMUNITY): Payer: Self-pay

## 2022-02-13 MED ORDER — HYDROCHLOROTHIAZIDE 25 MG PO TABS
25.0000 mg | ORAL_TABLET | Freq: Every day | ORAL | 1 refills | Status: DC
  Filled 2022-02-13: qty 90, 90d supply, fill #0
  Filled 2022-06-03: qty 90, 90d supply, fill #1

## 2022-02-20 ENCOUNTER — Other Ambulatory Visit (HOSPITAL_COMMUNITY): Payer: Self-pay

## 2022-02-20 ENCOUNTER — Ambulatory Visit: Payer: 59 | Admitting: Pulmonary Disease

## 2022-02-20 ENCOUNTER — Encounter: Payer: Self-pay | Admitting: Pulmonary Disease

## 2022-02-20 VITALS — BP 124/72 | HR 92 | Temp 98.1°F | Ht 65.0 in | Wt 224.2 lb

## 2022-02-20 DIAGNOSIS — J45991 Cough variant asthma: Secondary | ICD-10-CM | POA: Diagnosis not present

## 2022-02-20 DIAGNOSIS — J4530 Mild persistent asthma, uncomplicated: Secondary | ICD-10-CM | POA: Diagnosis not present

## 2022-02-20 MED ORDER — AZELASTINE HCL 0.1 % NA SOLN
1.0000 | Freq: Two times a day (BID) | NASAL | 2 refills | Status: DC
  Filled 2022-02-20: qty 30, 50d supply, fill #0
  Filled 2022-04-09: qty 30, 50d supply, fill #1

## 2022-02-20 NOTE — Progress Notes (Signed)
   Subjective:    Patient ID: Ariel Perez, female    DOB: 13-Feb-1981, 41 y.o.   MRN: [id number]  HPI  41 year old never smoker, [employer] employee who is transferring her care from Dr. Melvyn Novas to me She was referred to pulmonary clinic in [facility] 05/2021 for new onset wheezing since November 2021.  She has a history of recurrent sinus infections which generally require an antibiotic and prednisone course to improve. She was initially treated with antibiotic and prednisone, then started on Symbicort 07/2021.  Impression was cough variant asthma On a follow-up visit with APP 09/2021, she reported voice hoarseness and spacer was provided  She has stopped using Symbicort for 2 months due to concerns of macular degeneration.  She has high pressure in her eyes.  She uses albuterol MDI about 3 times a week including nocturnal use. She is able to exercise on the treadmill for 40 minutes at a pace of 3.2  She reports perennial allergies for which she takes Zyrtec and Flonase.  No postnasal drip or heartburn. Environment -she moved to an older home built in [address] 3 years ago, she has 2 dogs -age 64 years  Family history -Sister has adult onset asthma    Significant tests/ events reviewed  Spirometry 02/2022 no airway obstruction, ratio 85, FEV1 99% 06/07/2021 >  Eos 0.6 /  IgE  63  11/2015 HST >>neg    Review of Systems neg for any significant sore throat, dysphagia, itching, sneezing, nasal congestion or excess/ purulent secretions, fever, chills, sweats, unintended wt loss, pleuritic or exertional cp, hempoptysis, orthopnea pnd or change in chronic leg swelling. Also denies presyncope, palpitations, heartburn, abdominal pain, nausea, vomiting, diarrhea or change in bowel or urinary habits, dysuria,hematuria, rash, arthralgias, visual complaints, headache, numbness weakness or ataxia.     Objective:   Physical Exam  Gen. Pleasant, well-nourished, in no distress ENT - no thrush, no  pallor/icterus,no post nasal drip Neck: No JVD, no thyromegaly, no carotid bruits Lungs: no use of accessory muscles, no dullness to percussion, clear without rales or rhonchi  Cardiovascular: Rhythm regular, heart sounds  normal, no murmurs or gallops, no peripheral edema Musculoskeletal: No deformities, no cyanosis or clubbing        Assessment & Plan:

## 2022-02-20 NOTE — Assessment & Plan Note (Addendum)
She does seem to have adult onset asthma, symptoms of been ongoing for 1.5 years now and no obvious triggers elicited in her environment except for perennial allergies. We will ask her to intensify her nasal regimen by adding azelastine to Zyrtec and Flonase.  We discussed the side effects of long-term inhaled steroids in comparison to oral steroids for exacerbations. She agreed to a regimen of Symbicort as needed Spirometry was obtained which showed no evidence of airway obstruction, this is reassuring We will obtain RAST panel in the future

## 2022-02-20 NOTE — Patient Instructions (Signed)
  X spirometry - pre/post asap  Use symbicort 2 puffs as needed  X Rx azelastine nasal spray to alternate with flonase

## 2022-03-31 ENCOUNTER — Other Ambulatory Visit (HOSPITAL_COMMUNITY): Payer: Self-pay

## 2022-04-09 ENCOUNTER — Other Ambulatory Visit (HOSPITAL_COMMUNITY): Payer: Self-pay

## 2022-05-08 ENCOUNTER — Other Ambulatory Visit (HOSPITAL_COMMUNITY): Payer: Self-pay

## 2022-05-09 ENCOUNTER — Ambulatory Visit: Payer: 59 | Admitting: Internal Medicine

## 2022-06-03 ENCOUNTER — Encounter: Payer: Self-pay | Admitting: Pulmonary Disease

## 2022-06-03 ENCOUNTER — Ambulatory Visit: Payer: 59 | Admitting: Pulmonary Disease

## 2022-06-03 ENCOUNTER — Other Ambulatory Visit (HOSPITAL_COMMUNITY): Payer: Self-pay

## 2022-06-03 VITALS — BP 124/86 | HR 88 | Ht 65.0 in | Wt 226.2 lb

## 2022-06-03 DIAGNOSIS — J4530 Mild persistent asthma, uncomplicated: Secondary | ICD-10-CM

## 2022-06-03 LAB — POCT EXHALED NITRIC OXIDE: FeNO level (ppb): 57

## 2022-06-03 MED ORDER — BUDESONIDE-FORMOTEROL FUMARATE 160-4.5 MCG/ACT IN AERO
2.0000 | INHALATION_SPRAY | Freq: Two times a day (BID) | RESPIRATORY_TRACT | 6 refills | Status: DC
Start: 2022-06-03 — End: 2023-09-03
  Filled 2022-06-03: qty 10.2, 30d supply, fill #0
  Filled 2022-08-28: qty 10.2, 30d supply, fill #1
  Filled 2022-10-15: qty 10.2, 30d supply, fill #2
  Filled 2022-12-02: qty 10.2, 30d supply, fill #3
  Filled 2023-05-06: qty 10.2, 30d supply, fill #4

## 2022-06-03 MED ORDER — ALBUTEROL SULFATE HFA 108 (90 BASE) MCG/ACT IN AERS
1.0000 | INHALATION_SPRAY | Freq: Four times a day (QID) | RESPIRATORY_TRACT | 4 refills | Status: AC | PRN
  Filled 2022-06-03: qty 6.7, 25d supply, fill #0

## 2022-06-03 NOTE — Patient Instructions (Signed)
X Rx for symbicort 160 - 2 puffs twice daily  X check FENO  X RAST panel X refills on albuterol  Take zyrtec & flonase daily

## 2022-06-03 NOTE — Progress Notes (Signed)
     Subjective:    Patient ID: Ariel Perez, female    DOB: October 29, 1980, 41 y.o.   MRN: [id number]  HPI  41 yo never smoker, [employer] employee for FU of adult onset asthma , since 2022 She was referred to pulmonary clinic in [facility] 05/2021 for new onset wheezing since November 2021.  She has a history of recurrent sinus infections which generally require an antibiotic and prednisone course to improve perennial allergies for which she takes Zyrtec and [employer] -she moved to an older home built in [place] 3 years ago, she has 2 dogs -age 49 years  Initial OV with me 02/2022 >>intensify nasal regimen by adding azelastine to Zyrtec and Flonase, regimen of Symbicort as needed  59-monthfollow-up visit. She complains of intermittent wheezing in spite of taking Symbicort 82 puffs twice daily.  She occasionally needs albuterol for rescue, denies nocturnal symptoms. She takes azelastine about thrice a week and is on Zyrtec.  Flonase has fallen off her list We again reviewed her environment and possible exposures She admits to significant stress at work  Significant tests/ events reviewed  05/2022 FENO 57 Spirometry 02/2022 no airway obstruction, ratio 85, FEV1 99% 06/07/2021 >  Eos 700 /  IgE  63   11/2015 HST >>neg   Review of Systems neg for any significant sore throat, dysphagia, itching, sneezing, nasal congestion or excess/ purulent secretions, fever, chills, sweats, unintended wt loss, pleuritic or exertional cp, hempoptysis, orthopnea pnd or change in chronic leg swelling. Also denies presyncope, palpitations, heartburn, abdominal pain, nausea, vomiting, diarrhea or change in bowel or urinary habits, dysuria,hematuria, rash, arthralgias, visual complaints, headache, numbness weakness or ataxia.     Objective:   Physical Exam  Gen. Pleasant, obese, in no distress ENT - no lesions, no post nasal drip Neck: No JVD, no thyromegaly, no carotid bruits Lungs: no use of accessory  muscles, no dullness to percussion, decreased without rales or rhonchi  Cardiovascular: Rhythm regular, heart sounds  normal, no murmurs or gallops, no peripheral edema Musculoskeletal: No deformities, no cyanosis or clubbing , no tremors       Assessment & Plan:

## 2022-06-03 NOTE — Assessment & Plan Note (Signed)
We will step up to higher dose of Symbicort 162 puffs twice daily. We again reviewed the side effects of inhaled steroids versus oral steroids.  High FeNO indicates an inflammatory cause. We will check RAST panel for underlying etiology Hopefully we do not need a biologic here

## 2022-06-04 LAB — RESPIRATORY ALLERGY PROFILE REGION II ~~LOC~~

## 2022-06-04 LAB — INTERPRETATION:

## 2022-07-01 ENCOUNTER — Ambulatory Visit: Payer: 59 | Admitting: Internal Medicine

## 2022-07-01 ENCOUNTER — Encounter: Payer: Self-pay | Admitting: Internal Medicine

## 2022-07-01 VITALS — BP 122/68 | HR 83 | Temp 98.6°F | Ht 65.0 in | Wt 222.8 lb

## 2022-07-01 DIAGNOSIS — J4530 Mild persistent asthma, uncomplicated: Secondary | ICD-10-CM | POA: Diagnosis not present

## 2022-07-01 DIAGNOSIS — R739 Hyperglycemia, unspecified: Secondary | ICD-10-CM | POA: Diagnosis not present

## 2022-07-01 DIAGNOSIS — G43809 Other migraine, not intractable, without status migrainosus: Secondary | ICD-10-CM

## 2022-07-01 DIAGNOSIS — I10 Essential (primary) hypertension: Secondary | ICD-10-CM | POA: Diagnosis not present

## 2022-07-01 LAB — COMPREHENSIVE METABOLIC PANEL
ALT: 26 U/L (ref 0–35)
AST: 24 U/L (ref 0–37)
Albumin: 4.3 g/dL (ref 3.5–5.2)
Alkaline Phosphatase: 53 U/L (ref 39–117)
BUN: 12 mg/dL (ref 6–23)
CO2: 27 mEq/L (ref 19–32)
Calcium: 9.7 mg/dL (ref 8.4–10.5)
Chloride: 101 mEq/L (ref 96–112)
Creatinine, Ser: 0.95 mg/dL (ref 0.40–1.20)
GFR: 73.58 mL/min (ref >60.00)
Glucose, Bld: 98 mg/dL (ref 70–99)
Potassium: 4.1 mEq/L (ref 3.5–5.1)
Sodium: 137 mEq/L (ref 135–145)
Total Bilirubin: 0.8 mg/dL (ref 0.2–1.2)
Total Protein: 7.6 g/dL (ref 6.0–8.3)

## 2022-07-01 LAB — CBC WITH DIFFERENTIAL/PLATELET
Basophils Absolute: 0 10*3/uL (ref 0.0–0.1)
Basophils Relative: 0.7 % (ref 0.0–3.0)
Eosinophils Absolute: 0.3 10*3/uL (ref 0.0–0.7)
Eosinophils Relative: 5.7 % — ABNORMAL HIGH (ref 0.0–5.0)
HCT: 40.9 % (ref 36.0–46.0)
Hemoglobin: 13.6 g/dL (ref 12.0–15.0)
Lymphocytes Relative: 35.6 % (ref 12.0–46.0)
Lymphs Abs: 1.9 10*3/uL (ref 0.7–4.0)
MCHC: 33.2 g/dL (ref 30.0–36.0)
MCV: 90.3 fl (ref 78.0–100.0)
Monocytes Absolute: 0.3 10*3/uL (ref 0.1–1.0)
Monocytes Relative: 5.8 % (ref 3.0–12.0)
Neutro Abs: 2.8 10*3/uL (ref 1.4–7.7)
Neutrophils Relative %: 52.2 % (ref 43.0–77.0)
Platelets: 232 10*3/uL (ref 150.0–400.0)
RBC: 4.53 Mil/uL (ref 3.87–5.11)
RDW: 14.2 % (ref 11.5–15.5)
WBC: 5.4 10*3/uL (ref 4.0–10.5)

## 2022-07-01 LAB — LIPID PANEL
Cholesterol: 151 mg/dL (ref 0–200)
HDL: 45.7 mg/dL (ref >39.00)
LDL Cholesterol: 91 mg/dL (ref 0–99)
NonHDL: 104.9
Total CHOL/HDL Ratio: 3
Triglycerides: 72 mg/dL (ref 0.0–149.0)
VLDL: 14.4 mg/dL (ref 0.0–40.0)

## 2022-07-01 LAB — HEMOGLOBIN A1C: Hgb A1c MFr Bld: 6.1 % (ref 4.6–6.5)

## 2022-07-01 NOTE — Progress Notes (Signed)
Patient ID: Ariel Perez, female   DOB: 01-07-1981, 41 y.o.   MRN: [id number]   Subjective:    Patient ID: Ariel Perez, female    DOB: 04/12/1981, 41 y.o.   MRN: [id number]   Patient here for  Chief Complaint  Patient presents with   Follow-up    4 month follow up   .   HPI Here to follow up regarding her blood pressure.  States blood pressure checks:  120s/70s. Saw Dr Elsworth Soho 06/03/22 -  persistent intermittent wheezing. Increased dose of symbicort.  Recommended RAST panel.  Recommended continue flonase and zyrtec.  RAST - no significant allergen noted. Breathing is doing well.  No increased cough or congestion.  Feels better.  No chest pain reported.  No acid reflux.  No abdominal pain.  Bowels moving.  Going to gym.  Increased stress.  Discussed.     Past Medical History:  Diagnosis Date   Headache    Hypertension    Migraines    Osteoarthritis    Past Surgical History:  Procedure Laterality Date   WISDOM TOOTH EXTRACTION N/A 2008   Approx x4    Family History  Problem Relation Age of Onset   Cancer Father        unknown   Diabetes Paternal Grandmother    Hypertension Maternal Grandmother    Rheum arthritis Maternal Grandmother    Cancer Maternal Grandfather    Social History   Socioeconomic History   Marital status: Single    Spouse name: Not on file   Number of children: 0   Years of education: Not on file   Highest education level: Not on file  Occupational History   Occupation: Financial controller primary care  Tobacco Use   Smoking status: Never   Smokeless tobacco: Never  Vaping Use   Vaping Use: Never used  Substance and Sexual Activity   Alcohol use: Yes    Alcohol/week: 0.0 standard drinks of alcohol    Comment: occ   Drug use: No   Sexual activity: Yes    Partners: Male    Comment: 1 partner  Other Topics Concern   Not on file  Social History Narrative   Works here at [employer] by herself    2 dogs live inside    Caffeine- Occasional     Right handed    Enjoys sleeping    Social Determinants of Health   Financial Resource Strain: Not on file  Food Insecurity: Not on file  Transportation Needs: Not on file  Physical Activity: Not on file  Stress: Not on file  Social Connections: Not on file     Review of Systems  Constitutional:  Negative for appetite change and fatigue.  HENT:  Negative for congestion and sinus pressure.   Respiratory:  Negative for cough, chest tightness and shortness of breath.   Cardiovascular:  Negative for chest pain, palpitations and leg swelling.  Gastrointestinal:  Negative for abdominal pain, diarrhea, nausea and vomiting.  Genitourinary:  Negative for difficulty urinating and dysuria.  Musculoskeletal:  Negative for joint swelling and myalgias.  Skin:  Negative for color change and rash.  Neurological:  Negative for dizziness, light-headedness and headaches.  Psychiatric/Behavioral:  Negative for agitation and dysphoric mood.        Objective:     BP 122/68 (BP Location: Left Arm, Patient Position: Sitting, Cuff Size: Normal)   Pulse 83   Temp 98.6 F (37 C) (Oral)   Ht 5'  5" (1.651 m)   Wt 222 lb 12.8 oz (101.1 kg)   SpO2 99%   BMI 37.08 kg/m  Wt Readings from Last 3 Encounters:  07/01/22 222 lb 12.8 oz (101.1 kg)  06/03/22 226 lb 3.2 oz (102.6 kg)  02/20/22 224 lb 3.2 oz (101.7 kg)    Physical Exam Vitals reviewed.  Constitutional:      General: She is not in acute distress.    Appearance: Normal appearance.  HENT:     Head: Normocephalic and atraumatic.     Right Ear: External ear normal.     Left Ear: External ear normal.  Eyes:     General: No scleral icterus.       Right eye: No discharge.        Left eye: No discharge.     Conjunctiva/sclera: Conjunctivae normal.  Neck:     Thyroid: No thyromegaly.  Cardiovascular:     Rate and Rhythm: Normal rate and regular rhythm.  Pulmonary:     Effort: No respiratory distress.     Breath sounds: Normal breath  sounds. No wheezing.  Abdominal:     General: Bowel sounds are normal.     Palpations: Abdomen is soft.     Tenderness: There is no abdominal tenderness.  Musculoskeletal:        General: No swelling or tenderness.     Cervical back: Neck supple. No tenderness.  Lymphadenopathy:     Cervical: No cervical adenopathy.  Skin:    Findings: No erythema or rash.  Neurological:     Mental Status: She is alert.  Psychiatric:        Mood and Affect: Mood normal.        Behavior: Behavior normal.      Outpatient Encounter Medications as of 07/01/2022  Medication Sig   albuterol (VENTOLIN HFA) 108 (90 Base) MCG/ACT inhaler INHALE 1-2 PUFFS INTO THE LUNGS EVERY 6 (SIX) HOURS AS NEEDED FOR WHEEZING OR SHORTNESS OF BREATH.   azelastine (ASTELIN) 0.1 % nasal spray Place 1 spray into both nostrils 2 (two) times daily. Use in each nostril as directed   budesonide-formoterol (SYMBICORT) 160-4.5 MCG/ACT inhaler Inhale 2 puffs into the lungs in the morning and at bedtime.   hydrochlorothiazide (HYDRODIURIL) 25 MG tablet Take 1 tablet (25 mg total) by mouth daily.   levonorgestrel (MIRENA, 52 MG,) 20 MCG/24HR IUD Mirena 20 mcg/24 hours (6 yrs) 52 mg intrauterine device  Take 1 device by intrauterine route.   montelukast (SINGULAIR) 10 MG tablet Take 1 tablet (10 mg total) by mouth at bedtime.   No facility-administered encounter medications on file as of 07/01/2022.     Lab Results  Component Value Date   WBC 5.4 07/01/2022   HGB 13.6 07/01/2022   HCT 40.9 07/01/2022   PLT 232.0 07/01/2022   GLUCOSE 98 07/01/2022   CHOL 151 07/01/2022   TRIG 72.0 07/01/2022   HDL 45.70 07/01/2022   LDLCALC 91 07/01/2022   ALT 26 07/01/2022   AST 24 07/01/2022   NA 137 07/01/2022   K 4.1 07/01/2022   CL 101 07/01/2022   CREATININE 0.95 07/01/2022   BUN 12 07/01/2022   CO2 27 07/01/2022   TSH 1.28 07/01/2022   HGBA1C 6.1 07/01/2022    MM DIAG BREAST TOMO BILATERAL  Result Date: 11/29/2021 CLINICAL  DATA:  Follow-up probably benign left breast mass with no previous ultrasound correlate. EXAM: DIGITAL DIAGNOSTIC BILATERAL MAMMOGRAM WITH TOMOSYNTHESIS AND CAD TECHNIQUE: Bilateral digital diagnostic mammography and  breast tomosynthesis was performed. The images were evaluated with computer-aided detection. COMPARISON:  Previous exam(s). ACR Breast Density Category c: The breast tissue is heterogeneously dense, which may obscure small masses. FINDINGS: The cyst seen in the 9:30 o'clock position of the right breast 11/12/2020 is no longer visualized. The previously demonstrated oval, circumscribed mass in the posteromedial left breast, containing a coarse calcification, has not changed significantly since 11/12/2020 and 11/11/2019. Interval findings suspicious for malignancy in either breast. IMPRESSION: 1. The previously demonstrated probably benign mass in the posteromedial left breast has not changed significantly in more than 2 years, compatible with a benign mass. This does not need further follow-up. 2. No evidence of malignancy in either breast. RECOMMENDATION: Bilateral screening mammogram in 1 year. I have discussed the findings and recommendations with the patient. If applicable, a reminder letter will be sent to the patient regarding the next appointment. BI-RADS CATEGORY  2: Benign. Electronically Signed   By: Claudie Revering M.D.   On: 11/29/2021 10:59       Assessment & Plan:   Problem List Items Addressed This Visit     Essential hypertension - Primary    Blood pressure better on hctz 55m q day.  Continue to follow pressures.  Follow metabolic panel.        Relevant Orders   CBC with Differential/Platelet (Completed)   Comprehensive metabolic panel (Completed)   TSH (Completed)   Lipid panel (Completed)   Hyperglycemia    Low carb diet and exercise. Will follow met b and a1c.   Lab Results  Component Value Date   HGBA1C 6.1 07/01/2022       Relevant Orders   Hemoglobin A1c  (Completed)   Migraine, unspecified, not intractable, without status migrainosus    Stable.  Appears to be doing well.  No problems reported.       Mild persistent asthma    Seeing Dr AElsworth Soho  On higher dose of symbicort - 162 - bid.  RAST negative.  Breathing better.  Cough better.  Follow.         CEinar Pheasant MD

## 2022-07-02 LAB — TSH: TSH: 1.28 u[IU]/mL (ref 0.35–5.50)

## 2022-07-06 ENCOUNTER — Encounter: Payer: Self-pay | Admitting: Internal Medicine

## 2022-07-06 NOTE — Assessment & Plan Note (Signed)
Stable.  Appears to be doing well.  No problems reported.  

## 2022-07-06 NOTE — Assessment & Plan Note (Signed)
Blood pressure better on hctz 25mg q day.  Continue to follow pressures.  Follow metabolic panel.   

## 2022-07-06 NOTE — Assessment & Plan Note (Addendum)
Low carb diet and exercise. Will follow met b and a1c.   Lab Results  Component Value Date   HGBA1C 6.1 07/01/2022

## 2022-07-06 NOTE — Assessment & Plan Note (Signed)
Seeing Dr Elsworth Soho.  On higher dose of symbicort - 162 - bid.  RAST negative.  Breathing better.  Cough better.  Follow.

## 2022-07-16 DIAGNOSIS — Z23 Encounter for immunization: Secondary | ICD-10-CM | POA: Diagnosis not present

## 2022-08-28 ENCOUNTER — Other Ambulatory Visit: Payer: Self-pay | Admitting: Internal Medicine

## 2022-08-29 ENCOUNTER — Other Ambulatory Visit (HOSPITAL_COMMUNITY): Payer: Self-pay

## 2022-08-29 ENCOUNTER — Other Ambulatory Visit: Payer: Self-pay

## 2022-08-29 MED ORDER — HYDROCHLOROTHIAZIDE 25 MG PO TABS
25.0000 mg | ORAL_TABLET | Freq: Every day | ORAL | 2 refills | Status: DC
  Filled 2022-08-29: qty 90, 90d supply, fill #0

## 2022-09-01 ENCOUNTER — Other Ambulatory Visit (HOSPITAL_COMMUNITY): Payer: Self-pay

## 2022-09-01 MED ORDER — MONTELUKAST SODIUM 10 MG PO TABS
10.0000 mg | ORAL_TABLET | Freq: Every day | ORAL | 0 refills | Status: DC
  Filled 2022-09-01: qty 30, 30d supply, fill #0

## 2022-10-15 ENCOUNTER — Other Ambulatory Visit: Payer: Self-pay | Admitting: Pulmonary Disease

## 2022-10-16 ENCOUNTER — Other Ambulatory Visit (HOSPITAL_COMMUNITY): Payer: Self-pay

## 2022-10-16 MED ORDER — MONTELUKAST SODIUM 10 MG PO TABS
10.0000 mg | ORAL_TABLET | Freq: Every day | ORAL | 0 refills | Status: DC
  Filled 2022-10-16: qty 30, 30d supply, fill #0

## 2022-10-17 ENCOUNTER — Other Ambulatory Visit (HOSPITAL_COMMUNITY): Payer: Self-pay

## 2022-10-20 ENCOUNTER — Other Ambulatory Visit: Payer: Self-pay

## 2022-11-03 ENCOUNTER — Ambulatory Visit: Payer: Commercial Managed Care - PPO | Admitting: Internal Medicine

## 2022-11-03 ENCOUNTER — Encounter: Payer: Self-pay | Admitting: Internal Medicine

## 2022-11-03 VITALS — BP 122/74 | HR 85 | Temp 97.9°F | Resp 16 | Ht 65.5 in | Wt 228.0 lb

## 2022-11-03 DIAGNOSIS — I10 Essential (primary) hypertension: Secondary | ICD-10-CM | POA: Diagnosis not present

## 2022-11-03 DIAGNOSIS — Z1231 Encounter for screening mammogram for malignant neoplasm of breast: Secondary | ICD-10-CM

## 2022-11-03 DIAGNOSIS — R739 Hyperglycemia, unspecified: Secondary | ICD-10-CM | POA: Diagnosis not present

## 2022-11-03 DIAGNOSIS — J4530 Mild persistent asthma, uncomplicated: Secondary | ICD-10-CM | POA: Diagnosis not present

## 2022-11-03 DIAGNOSIS — G43809 Other migraine, not intractable, without status migrainosus: Secondary | ICD-10-CM

## 2022-11-03 DIAGNOSIS — D219 Benign neoplasm of connective and other soft tissue, unspecified: Secondary | ICD-10-CM

## 2022-11-03 DIAGNOSIS — Z0000 Encounter for general adult medical examination without abnormal findings: Secondary | ICD-10-CM

## 2022-11-03 LAB — LIPID PANEL
Cholesterol: 153 mg/dL (ref 0–200)
HDL: 46.8 mg/dL (ref >39.00)
LDL Cholesterol: 96 mg/dL (ref 0–99)
NonHDL: 106.54
Total CHOL/HDL Ratio: 3
Triglycerides: 54 mg/dL (ref 0.0–149.0)
VLDL: 10.8 mg/dL (ref 0.0–40.0)

## 2022-11-03 LAB — HEPATIC FUNCTION PANEL
ALT: 12 U/L (ref 0–35)
AST: 14 U/L (ref 0–37)
Albumin: 4.4 g/dL (ref 3.5–5.2)
Alkaline Phosphatase: 57 U/L (ref 39–117)
Bilirubin, Direct: 0.1 mg/dL (ref 0.0–0.3)
Total Bilirubin: 0.6 mg/dL (ref 0.2–1.2)
Total Protein: 7.4 g/dL (ref 6.0–8.3)

## 2022-11-03 LAB — BASIC METABOLIC PANEL
BUN: 12 mg/dL (ref 6–23)
CO2: 26 mEq/L (ref 19–32)
Calcium: 9.8 mg/dL (ref 8.4–10.5)
Chloride: 102 mEq/L (ref 96–112)
Creatinine, Ser: 1 mg/dL (ref 0.40–1.20)
GFR: 69.02 mL/min (ref >60.00)
Glucose, Bld: 90 mg/dL (ref 70–99)
Potassium: 3.8 mEq/L (ref 3.5–5.1)
Sodium: 137 mEq/L (ref 135–145)

## 2022-11-03 LAB — HEMOGLOBIN A1C: Hgb A1c MFr Bld: 6 % (ref 4.6–6.5)

## 2022-11-03 NOTE — Assessment & Plan Note (Addendum)
Physical today 11/03/22.  PAP 07/13/19 - negative with negative HPV.  Mammogram 11/29/21 - Birads II.  Recommended continuing annual screening. Being followed by gyn for pelvic exams,pap smears and breast exams.

## 2022-11-03 NOTE — Progress Notes (Signed)
Subjective:    Patient ID: Ariel Perez, female    DOB: 11-Oct-1980, 42 y.o.   MRN: [id number]  Patient here for  Chief Complaint  Patient presents with   Annual Exam    HPI Here for physical.  Sees gyn for her breast, pelvic and pap smears.  They also schedule her mammogram.   Seeing Dr Elsworth Soho.  On symbicort.  RAST - no significant allergen noted.  Has f/u this week.  Did notice some blood (when blew nose). Plans to start back on saline or her lavage.  On HCTZ for blood pressure.  States blood pressures have been running 120s/70's to low 80s.  No chest pain reported.  No abdominal pain or bowel change reported.  Finished school in 08/2022.     Past Medical History:  Diagnosis Date   Headache    Hypertension    Migraines    Osteoarthritis    Past Surgical History:  Procedure Laterality Date   WISDOM TOOTH EXTRACTION N/A 2008   Approx x4    Family History  Problem Relation Age of Onset   Cancer Father        unknown   Diabetes Paternal Grandmother    Hypertension Maternal Grandmother    Rheum arthritis Maternal Grandmother    Cancer Maternal Grandfather    Social History   Socioeconomic History   Marital status: Single    Spouse name: Not on file   Number of children: 0   Years of education: Not on file   Highest education level: Not on file  Occupational History   Occupation: Financial controller primary care  Tobacco Use   Smoking status: Never   Smokeless tobacco: Never  Vaping Use   Vaping Use: Never used  Substance and Sexual Activity   Alcohol use: Yes    Alcohol/week: 0.0 standard drinks of alcohol    Comment: occ   Drug use: No   Sexual activity: Yes    Partners: Male    Comment: 1 partner  Other Topics Concern   Not on file  Social History Narrative   Works here at [employer] by herself    2 dogs live inside    Caffeine- Occasional    Right handed    Enjoys sleeping    Social Determinants of Health   Financial Resource Strain: Not on file   Food Insecurity: Not on file  Transportation Needs: Not on file  Physical Activity: Not on file  Stress: Not on file  Social Connections: Not on file     Review of Systems  Constitutional:  Negative for appetite change and unexpected weight change.  HENT:  Negative for congestion, sinus pressure and sore throat.   Eyes:  Negative for pain and visual disturbance.  Respiratory:  Negative for cough, chest tightness and shortness of breath.   Cardiovascular:  Negative for chest pain, palpitations and leg swelling.  Gastrointestinal:  Negative for abdominal pain, diarrhea, nausea and vomiting.  Genitourinary:  Negative for difficulty urinating and dysuria.  Musculoskeletal:  Negative for joint swelling and myalgias.  Skin:  Negative for color change and rash.  Neurological:  Negative for dizziness and headaches.  Hematological:  Negative for adenopathy. Does not bruise/bleed easily.  Psychiatric/Behavioral:  Negative for agitation and dysphoric mood.        Objective:     BP 122/74   Pulse 85   Temp 97.9 F (36.6 C)   Resp 16   Ht 5' 5.5" (1.664 m)  Wt 228 lb (103.4 kg)   SpO2 97%   BMI 37.36 kg/m  Wt Readings from Last 3 Encounters:  11/07/22 229 lb (103.9 kg)  11/03/22 228 lb (103.4 kg)  07/01/22 222 lb 12.8 oz (101.1 kg)    Physical Exam Vitals reviewed.  Constitutional:      General: She is not in acute distress.    Appearance: Normal appearance.  HENT:     Head: Normocephalic and atraumatic.     Right Ear: External ear normal.     Left Ear: External ear normal.  Eyes:     General: No scleral icterus.       Right eye: No discharge.        Left eye: No discharge.     Conjunctiva/sclera: Conjunctivae normal.  Neck:     Thyroid: No thyromegaly.  Cardiovascular:     Rate and Rhythm: Normal rate and regular rhythm.  Pulmonary:     Effort: No respiratory distress.     Breath sounds: Normal breath sounds. No wheezing.  Abdominal:     General: Bowel sounds  are normal.     Palpations: Abdomen is soft.     Tenderness: There is no abdominal tenderness.  Musculoskeletal:        General: No swelling or tenderness.     Cervical back: Neck supple. No tenderness.  Lymphadenopathy:     Cervical: No cervical adenopathy.  Skin:    Findings: No erythema or rash.  Neurological:     Mental Status: She is alert.  Psychiatric:        Mood and Affect: Mood normal.        Behavior: Behavior normal.      Outpatient Encounter Medications as of 11/03/2022  Medication Sig   albuterol (VENTOLIN HFA) 108 (90 Base) MCG/ACT inhaler INHALE 1-2 PUFFS INTO THE LUNGS EVERY 6 (SIX) HOURS AS NEEDED FOR WHEEZING OR SHORTNESS OF BREATH.   azelastine (ASTELIN) 0.1 % nasal spray Place 1 spray into both nostrils 2 (two) times daily. Use in each nostril as directed   budesonide-formoterol (SYMBICORT) 160-4.5 MCG/ACT inhaler Inhale 2 puffs into the lungs in the morning and at bedtime.   hydrochlorothiazide (HYDRODIURIL) 25 MG tablet Take 1 tablet (25 mg total) by mouth daily.   levonorgestrel (MIRENA, 52 MG,) 20 MCG/24HR IUD Mirena 20 mcg/24 hours (6 yrs) 52 mg intrauterine device  Take 1 device by intrauterine route.   montelukast (SINGULAIR) 10 MG tablet Take 1 tablet (10 mg total) by mouth at bedtime.   No facility-administered encounter medications on file as of 11/03/2022.     Lab Results  Component Value Date   WBC 5.4 07/01/2022   HGB 13.6 07/01/2022   HCT 40.9 07/01/2022   PLT 232.0 07/01/2022   GLUCOSE 90 11/03/2022   CHOL 153 11/03/2022   TRIG 54.0 11/03/2022   HDL 46.80 11/03/2022   LDLCALC 96 11/03/2022   ALT 12 11/03/2022   AST 14 11/03/2022   NA 137 11/03/2022   K 3.8 11/03/2022   CL 102 11/03/2022   CREATININE 1.00 11/03/2022   BUN 12 11/03/2022   CO2 26 11/03/2022   TSH 1.28 07/01/2022   HGBA1C 6.0 11/03/2022    MM DIAG BREAST TOMO BILATERAL  Result Date: 11/29/2021 CLINICAL DATA:  Follow-up probably benign left breast mass with no  previous ultrasound correlate. EXAM: DIGITAL DIAGNOSTIC BILATERAL MAMMOGRAM WITH TOMOSYNTHESIS AND CAD TECHNIQUE: Bilateral digital diagnostic mammography and breast tomosynthesis was performed. The images were evaluated with computer-aided detection.  COMPARISON:  Previous exam(s). ACR Breast Density Category c: The breast tissue is heterogeneously dense, which may obscure small masses. FINDINGS: The cyst seen in the 9:30 o'clock position of the right breast 11/12/2020 is no longer visualized. The previously demonstrated oval, circumscribed mass in the posteromedial left breast, containing a coarse calcification, has not changed significantly since 11/12/2020 and 11/11/2019. Interval findings suspicious for malignancy in either breast. IMPRESSION: 1. The previously demonstrated probably benign mass in the posteromedial left breast has not changed significantly in more than 2 years, compatible with a benign mass. This does not need further follow-up. 2. No evidence of malignancy in either breast. RECOMMENDATION: Bilateral screening mammogram in 1 year. I have discussed the findings and recommendations with the patient. If applicable, a reminder letter will be sent to the patient regarding the next appointment. BI-RADS CATEGORY  2: Benign. Electronically Signed   By: Claudie Revering M.D.   On: 11/29/2021 10:59       Assessment & Plan:  Routine general medical examination at a health care facility  Healthcare maintenance Assessment & Plan: Physical today 11/03/22.  PAP 07/13/19 - negative with negative HPV.  Mammogram 11/29/21 - Birads II.  Recommended continuing annual screening. Being followed by gyn for pelvic exams,pap smears and breast exams.    Hyperglycemia Assessment & Plan: Low carb diet and exercise. Will follow met b and a1c.   Lab Results  Component Value Date   HGBA1C 6.0 11/03/2022    Orders: -     Hepatic function panel -     Hemoglobin A1c -     Lipid panel  Essential  hypertension Assessment & Plan: Blood pressure better on hctz 24m q day.  Continue to follow pressures.  Follow metabolic panel.    Orders: -     Basic metabolic panel -     Hepatic function panel  Visit for screening mammogram  Fibroids Assessment & Plan: Saw Dr AJulien Girtgyn. Pelvic ultrasound - two fibroids.  GYN - pelvic and pap smears. Continue f/u with gyn.    Other migraine without status migrainosus, not intractable Assessment & Plan: Stable.  Appears to be doing well.  No problems reported.    Mild persistent asthma without complication Assessment & Plan: Seeing Dr AElsworth Soho  On higher dose of symbicort - 162 - bid.  RAST negative.  Breathing better.  Cough better.  Has f/u this week.  With some congestion recently.  Plans to start back on saline or her lavage.      CEinar Pheasant MD

## 2022-11-07 ENCOUNTER — Other Ambulatory Visit (HOSPITAL_BASED_OUTPATIENT_CLINIC_OR_DEPARTMENT_OTHER): Payer: Self-pay

## 2022-11-07 ENCOUNTER — Encounter (HOSPITAL_BASED_OUTPATIENT_CLINIC_OR_DEPARTMENT_OTHER): Payer: Self-pay | Admitting: Pulmonary Disease

## 2022-11-07 ENCOUNTER — Other Ambulatory Visit (HOSPITAL_COMMUNITY): Payer: Self-pay

## 2022-11-07 ENCOUNTER — Ambulatory Visit (HOSPITAL_BASED_OUTPATIENT_CLINIC_OR_DEPARTMENT_OTHER): Payer: Commercial Managed Care - PPO | Admitting: Pulmonary Disease

## 2022-11-07 VITALS — BP 102/60 | HR 83 | Temp 98.3°F | Ht 65.5 in | Wt 229.0 lb

## 2022-11-07 DIAGNOSIS — J4530 Mild persistent asthma, uncomplicated: Secondary | ICD-10-CM | POA: Diagnosis not present

## 2022-11-07 MED ORDER — BUDESONIDE-FORMOTEROL FUMARATE 80-4.5 MCG/ACT IN AERO
2.0000 | INHALATION_SPRAY | Freq: Two times a day (BID) | RESPIRATORY_TRACT | 12 refills | Status: DC
  Filled 2022-11-07: qty 10.2, 30d supply, fill #0

## 2022-11-07 NOTE — Progress Notes (Signed)
   Subjective:    Patient ID: Ariel Perez, female    DOB: 1980-12-14, 42 y.o.   MRN: [id number]  HPI  42 yo never smoker, [employer] employee for FU of adult onset asthma , since 2022 She was referred to pulmonary clinic in [facility] 05/2021 for new onset wheezing since November 2021.  She has a history of recurrent sinus infections which generally require an antibiotic and prednisone course to improve perennial allergies for which she takes Zyrtec and [employer] -she moved to an older home built in [place] 3 years ago, she has 2 dogs -age 89 years   Initial OV with me 02/2022 >>intensify nasal regimen by adding azelastine to Zyrtec and Flonase, regimen of Symbicort as needed 05/2022 step up to Symbicort 160  65-monthfollow-up visit. She reports that asthma is much better controlled.  She has hardly needed to use albuterol.  She is compliant with Singulair.  She does report some nasal congestion and seasonal allergies   Significant tests/ events reviewed  05/2022 FENO 57 Spirometry 02/2022 no airway obstruction, ratio 85, FEV1 99% 06/07/2021 >  Eos 700 /  IgE  63 06/2022 eos 300              RAST neg IgE 69   11/2015 HST >>neg   Review of Systems neg for any significant sore throat, dysphagia, itching, sneezing, nasal congestion or excess/ purulent secretions, fever, chills, sweats, unintended wt loss, pleuritic or exertional cp, hempoptysis, orthopnea pnd or change in chronic leg swelling. Also denies presyncope, palpitations, heartburn, abdominal pain, nausea, vomiting, diarrhea or change in bowel or urinary habits, dysuria,hematuria, rash, arthralgias, visual complaints, headache, numbness weakness or ataxia.     Objective:   Physical Exam   Gen. Pleasant, well-nourished, in no distress ENT - no thrush, no pallor/icterus,no post nasal drip Neck: No JVD, no thyromegaly, no carotid bruits Lungs: no use of accessory muscles, no dullness to percussion, clear without rales or  rhonchi  Cardiovascular: Rhythm regular, heart sounds  normal, no murmurs or gallops, no peripheral edema Musculoskeletal: No deformities, no cyanosis or clubbing       Assessment & Plan:

## 2022-11-07 NOTE — Assessment & Plan Note (Signed)
Allergic phenotype. Symptoms well-controlled on Symbicort 160. She will continue on this for 3 months.  We will then attempt to stepdown to Symbicort 80.  She will call once she has done this for about a month to report. She will continue to use albuterol as for rescue

## 2022-11-07 NOTE — Patient Instructions (Signed)
Your asthma appears controlled on Symbicort 160  Try to decrease Symbicort to 80/4.52 puffs twice daily in late May/early June and report back please

## 2022-11-09 ENCOUNTER — Encounter: Payer: Self-pay | Admitting: Internal Medicine

## 2022-11-09 NOTE — Assessment & Plan Note (Signed)
Stable.  Appears to be doing well.  No problems reported.

## 2022-11-09 NOTE — Assessment & Plan Note (Signed)
Seeing Dr Elsworth Soho.  On higher dose of symbicort - 162 - bid.  RAST negative.  Breathing better.  Cough better.  Has f/u this week.  With some congestion recently.  Plans to start back on saline or her lavage.

## 2022-11-09 NOTE — Assessment & Plan Note (Signed)
Low carb diet and exercise. Will follow met b and a1c.   Lab Results  Component Value Date   HGBA1C 6.0 11/03/2022

## 2022-11-09 NOTE — Assessment & Plan Note (Signed)
Saw Dr Julien Girt gyn. Pelvic ultrasound - two fibroids.  GYN - pelvic and pap smears. Continue f/u with gyn.

## 2022-11-09 NOTE — Assessment & Plan Note (Signed)
Blood pressure better on hctz 42m q day.  Continue to follow pressures.  Follow metabolic panel.

## 2022-11-28 DIAGNOSIS — Z01419 Encounter for gynecological examination (general) (routine) without abnormal findings: Secondary | ICD-10-CM | POA: Diagnosis not present

## 2022-11-28 DIAGNOSIS — Z6837 Body mass index (BMI) 37.0-37.9, adult: Secondary | ICD-10-CM | POA: Diagnosis not present

## 2022-12-02 ENCOUNTER — Other Ambulatory Visit: Payer: Self-pay | Admitting: Pulmonary Disease

## 2022-12-02 ENCOUNTER — Other Ambulatory Visit: Payer: Self-pay

## 2022-12-02 ENCOUNTER — Other Ambulatory Visit (HOSPITAL_COMMUNITY): Payer: Self-pay

## 2022-12-02 MED ORDER — MONTELUKAST SODIUM 10 MG PO TABS
10.0000 mg | ORAL_TABLET | Freq: Every day | ORAL | 5 refills | Status: DC
  Filled 2022-12-02: qty 30, 30d supply, fill #0

## 2022-12-18 DIAGNOSIS — D251 Intramural leiomyoma of uterus: Secondary | ICD-10-CM | POA: Diagnosis not present

## 2022-12-18 DIAGNOSIS — Z1231 Encounter for screening mammogram for malignant neoplasm of breast: Secondary | ICD-10-CM | POA: Diagnosis not present

## 2022-12-18 LAB — HM MAMMOGRAPHY

## 2022-12-23 ENCOUNTER — Other Ambulatory Visit: Payer: Self-pay | Admitting: Interventional Radiology

## 2022-12-23 ENCOUNTER — Other Ambulatory Visit: Payer: Commercial Managed Care - PPO

## 2022-12-23 ENCOUNTER — Other Ambulatory Visit: Payer: Self-pay | Admitting: Obstetrics and Gynecology

## 2022-12-23 DIAGNOSIS — D250 Submucous leiomyoma of uterus: Secondary | ICD-10-CM

## 2023-01-10 ENCOUNTER — Ambulatory Visit
Admission: RE | Admit: 2023-01-10 | Discharge: 2023-01-10 | Disposition: A | Discharge status: Home / Self Care | Payer: Commercial Managed Care - PPO | Source: Ambulatory Visit | Attending: Interventional Radiology | Admitting: Interventional Radiology

## 2023-01-10 DIAGNOSIS — N83291 Other ovarian cyst, right side: Secondary | ICD-10-CM | POA: Diagnosis not present

## 2023-01-10 DIAGNOSIS — D259 Leiomyoma of uterus, unspecified: Secondary | ICD-10-CM | POA: Diagnosis not present

## 2023-01-10 DIAGNOSIS — D250 Submucous leiomyoma of uterus: Secondary | ICD-10-CM

## 2023-01-10 MED ORDER — GADOPICLENOL 0.5 MMOL/ML IV SOLN
9.0000 mL | Freq: Once | INTRAVENOUS | Status: AC | PRN
  Administered 2023-01-10: 9 mL via INTRAVENOUS

## 2023-01-13 ENCOUNTER — Ambulatory Visit
Admission: RE | Admit: 2023-01-13 | Discharge: 2023-01-13 | Disposition: A | Discharge status: Home / Self Care | Payer: Commercial Managed Care - PPO | Source: Ambulatory Visit | Attending: Obstetrics and Gynecology | Admitting: Obstetrics and Gynecology

## 2023-01-13 DIAGNOSIS — D250 Submucous leiomyoma of uterus: Secondary | ICD-10-CM

## 2023-01-13 HISTORY — PX: IR RADIOLOGIST EVAL & MGMT: IMG5224

## 2023-01-13 NOTE — Consult Note (Signed)
Chief Complaint: Patient was seen in consultation today for uterine fibroids at the request of Adkins,Gretchen  Referring Physician(s): Adkins,Gretchen  History of Present Illness: Ariel Perez is a 42 y.o. G0P0 female with a history of symptomatic uterine fibroids.  Her cycles are currently regular occurring every 28 days and last for approximately 5 days.  At this time, she has an IUD in place and does not suffer with particularly heavy bleeding or cramps.  Her primary symptoms are bulk related.  She feels the exophytic fibroid when she is exercising and is unable to do core work or exercise as often as she would like.  Her initial diagnosis of fibroids was in 2020.  She is aware that the larger fibroid in the lower uterine segment shows signs of internal degeneration.  This was further confirmed on her most recent MRI although the specter of possible malignant degeneration was mentioned.  Her most recent Pap smear on 11/28/2022 was negative.  Past Medical History:  Diagnosis Date   Headache    Hypertension    Migraines    Osteoarthritis     Past Surgical History:  Procedure Laterality Date   IR RADIOLOGIST EVAL & MGMT  01/13/2023   WISDOM TOOTH EXTRACTION N/A 2008   Approx x4     Allergies: Garcinia mangostana extract [garcinia cambogia]  Medications: Prior to Admission medications   Medication Sig Start Date End Date Taking? Authorizing Provider  albuterol (VENTOLIN HFA) 108 (90 Base) MCG/ACT inhaler INHALE 1-2 PUFFS INTO THE LUNGS EVERY 6 (SIX) HOURS AS NEEDED FOR WHEEZING OR SHORTNESS OF BREATH. 06/03/22   Oretha Milch, MD  azelastine (ASTELIN) 0.1 % nasal spray Place 1 spray into both nostrils 2 (two) times daily. Use in each nostril as directed 02/20/22   Oretha Milch, MD  budesonide-formoterol [facility]) 160-4.5 MCG/ACT inhaler Inhale 2 puffs into the lungs in the morning and at bedtime. 06/03/22   Oretha Milch, MD  budesonide-formoterol (SYMBICORT)  80-4.5 MCG/ACT inhaler Take 2 puffs first thing in the morning and then another 2 puffs about 12 hours later. 11/07/22   Oretha Milch, MD  hydrochlorothiazide (HYDRODIURIL) 25 MG tablet Take 1 tablet (25 mg total) by mouth daily. 08/29/22   Dale , MD  levonorgestrel (MIRENA, 52 MG,) 20 MCG/24HR IUD Mirena 20 mcg/24 hours (6 yrs) 52 mg intrauterine device  Take 1 device by intrauterine route.    [provider]  montelukast (SINGULAIR) 10 MG tablet Take 1 tablet (10 mg total) by mouth at bedtime. 12/02/22   Oretha Milch, MD     Family History  Problem Relation Age of Onset   Cancer Father        unknown   Diabetes Paternal Grandmother    Hypertension Maternal Grandmother    Rheum arthritis Maternal Grandmother    Cancer Maternal Grandfather     Social History   Socioeconomic History   Marital status: Single    Spouse name: Not on file   Number of children: 0   Years of education: Not on file   Highest education level: Not on file  Occupational History   Occupation: Adult nurse primary care  Tobacco Use   Smoking status: Never   Smokeless tobacco: Never  Vaping Use   Vaping Use: Never used  Substance and Sexual Activity   Alcohol use: Yes    Alcohol/week: 0.0 standard drinks of alcohol    Comment: occ   Drug use: No   Sexual activity: Yes  Partners: Male    Comment: 1 partner  Other Topics Concern   Not on file  Social History Narrative   Works here at [employer] by herself    2 dogs live inside    Caffeine- Occasional    Right handed    Enjoys sleeping    Social Determinants of Health   Financial Resource Strain: Not on file  Food Insecurity: Not on file  Transportation Needs: Not on file  Physical Activity: Not on file  Stress: Not on file  Social Connections: Not on file     Review of Systems: A 12 point ROS discussed and pertinent positives are indicated in the HPI above.  All other systems are negative.  Review of  Systems  Vital Signs: BP (!) 160/103 (BP Location: Left Arm, Patient Position: Sitting, Cuff Size: Normal)   Pulse 62   Temp 97.6 F (36.4 C) (Oral)   Resp 16   SpO2 96%     Physical Exam Constitutional:      General: She is not in acute distress.    Appearance: Normal appearance.  HENT:     Head: Normocephalic and atraumatic.  Eyes:     General: No scleral icterus. Cardiovascular:     Rate and Rhythm: Normal rate.  Pulmonary:     Effort: Pulmonary effort is normal.  Abdominal:     Palpations: Abdomen is soft. There is mass.  Skin:    General: Skin is warm and dry.  Neurological:     Mental Status: She is alert and oriented to person, place, and time.  Psychiatric:        Mood and Affect: Mood normal.        Behavior: Behavior normal.       Imaging: IR Radiologist Eval & Mgmt  Result Date: 01/13/2023 EXAM: NEW PATIENT OFFICE VISIT CHIEF COMPLAINT: SEE EPIC NOTE HISTORY OF PRESENT ILLNESS: SEE EPIC NOTE REVIEW OF SYSTEMS: SEE EPIC NOTE PHYSICAL EXAMINATION: SEE EPIC NOTE ASSESSMENT AND PLAN: SEE EPIC NOTE Electronically Signed   By: Malachy Moan M.D.   On: 01/13/2023 16:15   MR PELVIS W WO CONTRAST  Result Date: 01/10/2023 CLINICAL DATA:  Fibroids.  Increasing over the last year. EXAM: MRI PELVIS WITHOUT AND WITH CONTRAST TECHNIQUE: Multiplanar multisequence MR imaging of the pelvis was performed both before and after administration of intravenous contrast. CONTRAST:  9 cc from a single use bottle IV contrast with 1 cc discarded.[employer] COMPARISON:  None Available. FINDINGS: Urinary Tract: Bladder is contracted. Bowel: Visualized bowel is nondilated. This includes loops of small and large bowel. The stomach this has some luminal debris at the edge of the imaging field on coronal T2 datasets. Vascular/Lymphatic: Prominent periuterine vessels. Otherwise preserved portions of the aorta, IVC and iliac vessels. No discrete abnormal lymph node enlargement seen in the pelvis.  Reproductive: Uterus: Measures 20.5 x 10.1 by 9.9 cm. There are multiple lesions which are dark on T2 consistent with fibroids. Several these are small. For example on series 4 image 16 on the right side measuring 2.3 by 1.7 cm. Just to the left of this is a second focus measuring 18 by 11 mm. These lesions have some mild enhancement greatest on the most delayed dataset of the dynamic. Adjacent to the small dome left-sided lesion is a exophytic slightly pedunculated fibroid which also is dark on T2 measuring 9.1 x 6.7 by 8.4 cm. This lesion also has some enhancement more on the later dynamic images. Less arterial  enhancement. This is this has a narrow waist relative to its connection to the uterus. There is a large heterogeneous fibroid however towards the lower uterine segment posteriorly with mass effect distortion of the rest of the myometrium. This lesion has more areas that are heterogeneously bright on T2 and dark on T1. This has more enhancement in the late arterial phase which is progressive to the delays. Lesion measures 11.6 by 8.7 by 9.8 cm. No restricted diffusion within this lesion but does have some aggressive features with appearance on T2 and the earlier phases of enhancement. There is distortion of the endometrium by the numerous fibroids. The endometrium is difficult to measure. Right ovary: Right ovary measures 4.2 2.7 by 2.5 cm. There is a minimally complex cystic lesion associated with the right ovary measuring 2.8 by 2.1 cm. Relatively nonaggressive. Left ovary: Measures 2.8 by 1.8 By 1.5 cm. This has some fluid along its margin. Small follicles. Preserved enhancement. Other: Trace pelvic free fluid. Musculoskeletal:  Unremarkable. IMPRESSION: Enlarged uterus measuring 20.5 x 10.1 x 9.9 cm. There are multiple fibroids identified. Many of which are standard dark T2 low T1 with moderate enhancement on delays. This includes a large however pedunculated exophytic fibroid from the fundus on the left  side measuring 9.1 cm. There is a more aggressive appearing lesion along the lower uterine segment with more arterial enhancement and heterogeneous signal measuring 11.6 x 8.7 cm. This lesion does not show restricted diffusion. A malignant degeneration of the fibroid is not excluded on this examination. Please correlate with any prior imaging to assess for interval growth of fibroids, particularly this lower uterine segment fibroid. Trace free fluid in the pelvis. Mildly complex right-sided ovarian cystic lesion. No specific imaging follow-up of this ovarian lesion. Likely a functional cyst. Electronically Signed   By: Karen Kays M.D.   On: 01/10/2023 20:12    Labs:  CBC: Recent Labs    07/01/22 0902  WBC 5.4  HGB 13.6  HCT 40.9  PLT 232.0    COAGS: No results for input(s): "INR", "APTT" in the last 8760 hours.  BMP: Recent Labs    07/01/22 0902 11/03/22 1044  NA 137 137  K 4.1 3.8  CL 101 102  CO2 27 26  GLUCOSE 98 90  BUN 12 12  CALCIUM 9.7 9.8  CREATININE 0.95 1.00    LIVER FUNCTION TESTS: Recent Labs    07/01/22 0902 11/03/22 1044  BILITOT 0.8 0.6  AST 24 14  ALT 26 12  ALKPHOS 53 57  PROT 7.6 7.4  ALBUMIN 4.3 4.4    TUMOR MARKERS: No results for input(s): "AFPTM", "CEA", "CA199", "CHROMGRNA" in the last 8760 hours.  Assessment and Plan:  Extremely pleasant 42 year old female with large symptomatic uterine fibroids.  Her primary symptoms are bulk related, particularly from the fibroid exophytic from the left anterior aspect of the uterine fundus.  I spoke to her in detail about the concerning findings on her MRI including atypical enhancement pattern with in the large fibroid in the lower uterine segment and the possibility that this could represent malignant degeneration of a uterine fibroid.  I advised her to strongly consider hysterectomy.  At this time, she is adamant that she does not want to undergo hysterectomy.  We discussed other options including  repeat MRI at 3 months to assess changes in the lesion versus proceeding with [place] now and also following up with repeat MRI.  She voiced that she understands the risks but feels strongly that  she would like to proceed with [place] and subsequent surveillance imaging.  1.) IUD removal.  She will schedule this with Dr. Renaldo Fiddler. 2.)  Once the IUD is out, she can be scheduled for superior hypogastric nerve block and bilateral uterine artery embolization.  Thank you for this interesting consult.  I greatly enjoyed meeting Shanyia Stines and look forward to participating in their care.  A copy of this report was sent to the requesting provider on this date.  Electronically Signed: Sterling Big 01/13/2023, 4:41 PM   I spent a total of 40 Minutes  in face to face in clinical consultation, greater than 50% of which was counseling/coordinating care for uterine fibroids

## 2023-01-29 DIAGNOSIS — D259 Leiomyoma of uterus, unspecified: Secondary | ICD-10-CM | POA: Diagnosis not present

## 2023-02-04 ENCOUNTER — Other Ambulatory Visit: Payer: Self-pay | Admitting: Interventional Radiology

## 2023-02-04 DIAGNOSIS — D259 Leiomyoma of uterus, unspecified: Secondary | ICD-10-CM

## 2023-02-10 ENCOUNTER — Telehealth: Payer: Self-pay | Admitting: *Deleted

## 2023-02-10 NOTE — Telephone Encounter (Signed)
Spoke with the patient regarding the referral to GYN oncology. Patient scheduled as new patient with Dr Alvester Morin on 6/17 at 10:30 am. Patient given an arrival time of 10 am.  Explained to the patient the the doctor will perform a pelvic exam at this visit. Patient given the policy that only one visitor allowed and that visitor must be over 16 yrs are allowed in the Cancer Center. Patient given the address/phone number for the clinic and that the center offers free valet service. Patient aware that masks are option.

## 2023-02-13 DIAGNOSIS — H40013 Open angle with borderline findings, low risk, bilateral: Secondary | ICD-10-CM | POA: Diagnosis not present

## 2023-03-06 ENCOUNTER — Encounter: Payer: Self-pay | Admitting: Psychiatry

## 2023-03-09 ENCOUNTER — Inpatient Hospital Stay: Payer: Commercial Managed Care - PPO | Attending: Psychiatry | Admitting: Psychiatry

## 2023-03-09 ENCOUNTER — Other Ambulatory Visit: Payer: Self-pay

## 2023-03-09 ENCOUNTER — Encounter: Payer: Self-pay | Admitting: Psychiatry

## 2023-03-09 VITALS — BP 146/86 | HR 79 | Temp 98.4°F | Resp 16 | Ht 65.75 in | Wt 235.6 lb

## 2023-03-09 DIAGNOSIS — J45909 Unspecified asthma, uncomplicated: Secondary | ICD-10-CM | POA: Diagnosis not present

## 2023-03-09 DIAGNOSIS — M1990 Unspecified osteoarthritis, unspecified site: Secondary | ICD-10-CM | POA: Diagnosis not present

## 2023-03-09 DIAGNOSIS — D219 Benign neoplasm of connective and other soft tissue, unspecified: Secondary | ICD-10-CM

## 2023-03-09 DIAGNOSIS — Z79899 Other long term (current) drug therapy: Secondary | ICD-10-CM | POA: Diagnosis not present

## 2023-03-09 DIAGNOSIS — D390 Neoplasm of uncertain behavior of uterus: Secondary | ICD-10-CM | POA: Insufficient documentation

## 2023-03-09 DIAGNOSIS — R140 Abdominal distension (gaseous): Secondary | ICD-10-CM | POA: Insufficient documentation

## 2023-03-09 DIAGNOSIS — N858 Other specified noninflammatory disorders of uterus: Secondary | ICD-10-CM

## 2023-03-09 DIAGNOSIS — I10 Essential (primary) hypertension: Secondary | ICD-10-CM | POA: Insufficient documentation

## 2023-03-09 DIAGNOSIS — Z975 Presence of (intrauterine) contraceptive device: Secondary | ICD-10-CM | POA: Diagnosis not present

## 2023-03-09 DIAGNOSIS — R6881 Early satiety: Secondary | ICD-10-CM | POA: Diagnosis not present

## 2023-03-09 DIAGNOSIS — D259 Leiomyoma of uterus, unspecified: Secondary | ICD-10-CM | POA: Diagnosis not present

## 2023-03-09 NOTE — Progress Notes (Unsigned)
GYNECOLOGIC ONCOLOGY NEW PATIENT CONSULTATION  Date of Service: 03/09/2023 Referring Provider: Zelphia Cairo, MD   ASSESSMENT AND PLAN: Velna Iadarola is a 42 y.o. woman with uterine fibroids and 11.6cm uterine mass with heterogeneous enhancement.  Reviewed imaging findings and exam findings with patient.    Reviewed concern for one of the uterine masses appearing more abnormal with heterogeneous enhancement on MRI.  We discussed the concern for a possible malignant process, i.e. a sarcoma.  We discussed that diagnosis is only able to be certain with pathology.  Definitive diagnosis cannot be made via imaging.  A benign process is also possible, such as a degenerating fibroid.  We did review that if this were to represent a sarcoma, sarcomas are more aggressive tumors and can be more challenging to treat.  Given above, would recommend hysterectomy for definitive diagnosis and treatment.  Patient is not interested in pursuing hysterectomy at this time.  She does have some concerns about the length of recovery, but she does not otherwise elaborate on additional concerns surrounding proceeding with a hysterectomy.  Patient aware that delaying diagnosis could impact survival.  Otherwise, we discussed repeat imaging with MRI pelvis in 3 months.  I also recommend a CT chest now, given that if evidence of concern for distant disease present (subtotal spread of sarcoma is to lungs), then this would potentially inform patient's decision making.  Plan for follow-up in 3 months following repeat MRI.  A copy of this note was sent to the patient's referring provider.  Clide Cliff, MD Gynecologic Oncology   Medical Decision Making I personally spent  TOTAL 50 minutes face-to-face and non-face-to-face in the care of this patient, which includes all pre, intra, and post visit time on the date of service.   ------------  CC: uterine mass  HISTORY OF PRESENT ILLNESS:  Shuwanda Shiley Linz  is a 42 y.o. woman who is seen in consultation at the request of Zelphia Cairo, MD for evaluation of uterine mass.  Patient was seen by her OB/GYN on 11/28/2022 for an annual exam.  At that time she had an IUD in place.  Exam noted an 18 cm size fibroid uterus.  Repeat pelvic ultrasound was recommended.  She followed up on 12/18/2022.  At that time her ultrasound was reviewed and noted steady increase in size of fibroids.  Ultrasound noted an 11 x 8 x 10 cm intramural fibroid and a 9 x 7 x 9 cm fundal subserosal fibroid.  Treatment options were discussed and a consult for [place] was placed.  She then underwent MRI and IR consult.  MRI on 01/10/2023 noted a large 20.5 x 10.1 x 9.9 cm uterus with multiple fibroids.  Most fibroids appear benign, however 1 lesion in the lower uterine segment measuring 11.6 x 8.7 cm showed arterial enhancement and heterogeneous signal.  At her consultation with [facility] she was counseled on her abnormal MRI findings and strongly counseled toward hysterectomy.  Patient was adamant that she declined hysterectomy and would like to proceed with [place].  She was also offered repeat MRI in 3 months.  She was seen by her OB/GYN on 01/29/2023 for IUD removal in preparation for [place].  However, IUD was unable to be removed in office.  Today , presents alone.  She reports monthly.  Better overall light while on her IUD.  This was replaced approximately 2 years ago.  She denies significant weight loss, change in bowel or bladder habits.  She does note some stable bloating and occasional  early satiety.  She reports that she feels the fibroids and this is bothersome with exercise, so she has not been exercising recently.  She denies any issues with urinary incontinence, difficulty emptying her bladder, constipation, or pain with defecation.  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   PAST MEDICAL HISTORY: Past Medical History:  Diagnosis Date   Asthma    Fibroid    Headache    Hypertension    Migraines     Osteoarthritis     PAST SURGICAL HISTORY: Past Surgical History:  Procedure Laterality Date   IR RADIOLOGIST EVAL & MGMT  01/13/2023   WISDOM TOOTH EXTRACTION N/A 2008   Approx x4     OB/GYN HISTORY: OB History  Gravida Para Term Preterm AB Living  0 0 0 0 0 0  SAB IAB Ectopic Multiple Live Births  0 0 0 0 0      Age at menarche: 6 Age at menopause: N/A Hx of HRT: levonorgestrel IUD in place Hx of STI: No Last pap: 11/28/2022 NILM, HPV high-risk negative History of abnormal pap smears: Yes, reports colposcopy and biopsies that were negative (20s or early 30s), normal pap smears since  SCREENING STUDIES:  Last mammogram: 11/2022 Last colonoscopy: n/a  MEDICATIONS:  Current Outpatient Medications:    albuterol (VENTOLIN HFA) 108 (90 Base) MCG/ACT inhaler, INHALE 1-2 PUFFS INTO THE LUNGS EVERY 6 (SIX) HOURS AS NEEDED FOR WHEEZING OR SHORTNESS OF BREATH., Disp: 6.7 g, Rfl: 4   budesonide-formoterol (SYMBICORT) 160-4.5 MCG/ACT inhaler, Inhale 2 puffs into the lungs in the morning and at bedtime., Disp: 10.2 g, Rfl: 6   budesonide-formoterol (SYMBICORT) 80-4.5 MCG/ACT inhaler, Take 2 puffs first thing in the morning and then another 2 puffs about 12 hours later., Disp: 10.2 g, Rfl: 12   hydrochlorothiazide (HYDRODIURIL) 25 MG tablet, Take 1 tablet (25 mg total) by mouth daily., Disp: 90 tablet, Rfl: 2   levonorgestrel (MIRENA, 52 MG,) 20 MCG/24HR IUD, Mirena 20 mcg/24 hours (6 yrs) 52 mg intrauterine device  Take 1 device by intrauterine route., Disp: , Rfl:    montelukast (SINGULAIR) 10 MG tablet, Take 1 tablet (10 mg total) by mouth at bedtime., Disp: 30 tablet, Rfl: 5   azelastine (ASTELIN) 0.1 % nasal spray, Place 1 spray into both nostrils 2 (two) times daily. Use in each nostril as directed (Patient not taking: Reported on 03/06/2023), Disp: 30 mL, Rfl: 2  ALLERGIES: Allergies  Allergen Reactions   Garcinia Mangostana Extract [Garcinia Cambogia]     Itchy throat     FAMILY  HISTORY: Family History  Problem Relation Age of Onset   Cancer Father        unknown   Hypertension Maternal Grandmother    Rheum arthritis Maternal Grandmother    Cancer Maternal Grandfather        unknown   Diabetes Paternal Grandmother    Breast cancer Neg Hx    Ovarian cancer Neg Hx    Uterine cancer Neg Hx    Prostate cancer Neg Hx    Colon cancer Neg Hx    Pancreatic cancer Neg Hx     SOCIAL HISTORY: Social History   Socioeconomic History   Marital status: Single    Spouse name: Not on file   Number of children: 0   Years of education: Not on file   Highest education level: Not on file  Occupational History   Occupation: Adult nurse primary care  Tobacco Use   Smoking status: Never  Smokeless tobacco: Never  Vaping Use   Vaping Use: Never used  Substance and Sexual Activity   Alcohol use: Not Currently   Drug use: No   Sexual activity: Yes    Partners: Male    Birth control/protection: I.U.D.    Comment: 1 partner  Other Topics Concern   Not on file  Social History Narrative   Works here at [employer] by herself    2 dogs live inside    Caffeine- Occasional    Right handed    Enjoys sleeping    Social Determinants of Health   Financial Resource Strain: Not on file  Food Insecurity: No Food Insecurity (03/06/2023)   Hunger Vital Sign    Worried About Running Out of Food in the Last Year: Never true    Ran Out of Food in the Last Year: Never true  Transportation Needs: No Transportation Needs (03/06/2023)   PRAPARE - Administrator, Civil Service (Medical): No    Lack of Transportation (Non-Medical): No  Physical Activity: Not on file  Stress: Not on file  Social Connections: Not on file  Intimate Partner Violence: Not At Risk (03/06/2023)   Humiliation, Afraid, Rape, and Kick questionnaire    Fear of Current or Ex-Partner: No    Emotionally Abused: No    Physically Abused: No    Sexually Abused: No    REVIEW OF SYSTEMS: New  patient intake form was reviewed.  Complete 10-system review is negative except for the following: none  PHYSICAL EXAM: BP (!) 146/86   Pulse 79   Temp 98.4 F (36.9 C)   Resp 16   Ht 5' 5.75" (1.67 m)   Wt 235 lb 9.6 oz (106.9 kg)   SpO2 99%   BMI 38.32 kg/m  Constitutional: No acute distress. Neuro/Psych: Alert, oriented.  Head and Neck: Normocephalic, atraumatic. Neck symmetric without masses. Sclera anicteric.  Respiratory: Normal work of breathing. Clear to auscultation bilaterally. Cardiovascular: Regular rate and rhythm, no murmurs, rubs, or gallops. Abdomen: Normoactive bowel sounds. Soft, non-distended, non-tender to palpation.  Uterine mass palpated to just below the level of the umbilicus. Extremities: Grossly normal range of motion. Warm, well perfused. No edema bilaterally. Skin: No rashes or lesions. Lymphatic: No cervical, supraclavicular, or inguinal adenopathy. Genitourinary: External genitalia without lesions. Urethral meatus without lesions or prolapse. On speculum exam, vagina without lesions. Unable to visualize cervix. Bimanual exam reveals cervix is pulled cephalad and anterior behind pubic symphysis. Palpates normal and I believe I can appreciate IUD strings. Enlarged 20cm uterus with fundal mass and large posterior mass which fills the posterior cul-de-sac, not mobile.  Exam chaperoned by Warner Mccreedy, NP   LABORATORY AND RADIOLOGIC DATA: Outside medical records were reviewed to synthesize the above history, along with the history and physical obtained during the visit.  Outside laboratory, and imaging reports were reviewed, with pertinent results below.  I personally reviewed the outside images.  WBC  Date Value Ref Range Status  07/01/2022 5.4 4.0 - 10.5 K/uL Final   Hemoglobin  Date Value Ref Range Status  07/01/2022 13.6 12.0 - 15.0 g/dL Final   HCT  Date Value Ref Range Status  07/01/2022 40.9 36.0 - 46.0 % Final   Platelets  Date Value Ref  Range Status  07/01/2022 232.0 150.0 - 400.0 K/uL Final   Creatinine, Ser  Date Value Ref Range Status  11/03/2022 1.00 0.40 - 1.20 mg/dL Final   AST  Date Value Ref Range  Status  11/03/2022 14 0 - 37 U/L Final   ALT  Date Value Ref Range Status  11/03/2022 12 0 - 35 U/L Final    MR PELVIS W WO CONTRAST 01/10/2023  Narrative CLINICAL DATA:  Fibroids.  Increasing over the last year.  EXAM: MRI PELVIS WITHOUT AND WITH CONTRAST  TECHNIQUE: Multiplanar multisequence MR imaging of the pelvis was performed both before and after administration of intravenous contrast.  CONTRAST:  9 cc from a single use bottle IV contrast with 1 cc discarded.[employer]  COMPARISON:  None Available.  FINDINGS: Urinary Tract: Bladder is contracted.  Bowel: Visualized bowel is nondilated. This includes loops of small and large bowel. The stomach this has some luminal debris at the edge of the imaging field on coronal T2 datasets.  Vascular/Lymphatic: Prominent periuterine vessels. Otherwise preserved portions of the aorta, IVC and iliac vessels. No discrete abnormal lymph node enlargement seen in the pelvis.  Reproductive:  Uterus: Measures 20.5 x 10.1 by 9.9 cm. There are multiple lesions which are dark on T2 consistent with fibroids. Several these are small. For example on series 4 image 16 on the right side measuring 2.3 by 1.7 cm. Just to the left of this is a second focus measuring 18 by 11 mm. These lesions have some mild enhancement greatest on the most delayed dataset of the dynamic. Adjacent to the small dome left-sided lesion is a exophytic slightly pedunculated fibroid which also is dark on T2 measuring 9.1 x 6.7 by 8.4 cm. This lesion also has some enhancement more on the later dynamic images. Less arterial enhancement. This is this has a narrow waist relative to its connection to the uterus.  There is a large heterogeneous fibroid however towards the lower uterine segment  posteriorly with mass effect distortion of the rest of the myometrium. This lesion has more areas that are heterogeneously bright on T2 and dark on T1. This has more enhancement in the late arterial phase which is progressive to the delays. Lesion measures 11.6 by 8.7 by 9.8 cm. No restricted diffusion within this lesion but does have some aggressive features with appearance on T2 and the earlier phases of enhancement.  There is distortion of the endometrium by the numerous fibroids. The endometrium is difficult to measure.  Right ovary: Right ovary measures 4.2 2.7 by 2.5 cm. There is a minimally complex cystic lesion associated with the right ovary measuring 2.8 by 2.1 cm. Relatively nonaggressive.  Left ovary: Measures 2.8 by 1.8 By 1.5 cm. This has some fluid along its margin. Small follicles. Preserved enhancement.  Other: Trace pelvic free fluid.  Musculoskeletal:  Unremarkable.  IMPRESSION: Enlarged uterus measuring 20.5 x 10.1 x 9.9 cm.  There are multiple fibroids identified. Many of which are standard dark T2 low T1 with moderate enhancement on delays. This includes a large however pedunculated exophytic fibroid from the fundus on the left side measuring 9.1 cm.  There is a more aggressive appearing lesion along the lower uterine segment with more arterial enhancement and heterogeneous signal measuring 11.6 x 8.7 cm. This lesion does not show restricted diffusion. A malignant degeneration of the fibroid is not excluded on this examination. Please correlate with any prior imaging to assess for interval growth of fibroids, particularly this lower uterine segment fibroid.  Trace free fluid in the pelvis.  Mildly complex right-sided ovarian cystic lesion. No specific imaging follow-up of this ovarian lesion. Likely a functional cyst.   Electronically Signed By: Karen Kays M.D. On: 01/10/2023 20:12

## 2023-03-09 NOTE — Patient Instructions (Signed)
It was a pleasure to see you in clinic today. - We have discussed that recommendation is for hysterectomy for definitive diagnosis. - We will get a CT chest, to rule out any lung nodules. - Otherwise plan for MRI repeat in 3 months - Return visit planned for 3 months after MRI  Thank you very much for allowing me to provide care for you today.  I appreciate your confidence in choosing our Gynecologic Oncology team at [facility].  If you have any questions about your visit today please call our office or send [place] a MyChart message and we will get back to you as soon as possible.

## 2023-03-11 ENCOUNTER — Encounter: Payer: Self-pay | Admitting: Psychiatry

## 2023-03-19 ENCOUNTER — Ambulatory Visit (HOSPITAL_COMMUNITY)
Admission: RE | Admit: 2023-03-19 | Discharge: 2023-03-19 | Disposition: A | Discharge status: Home / Self Care | Payer: Commercial Managed Care - PPO | Source: Ambulatory Visit | Attending: Psychiatry | Admitting: Psychiatry

## 2023-03-19 DIAGNOSIS — R911 Solitary pulmonary nodule: Secondary | ICD-10-CM | POA: Diagnosis not present

## 2023-03-19 DIAGNOSIS — N858 Other specified noninflammatory disorders of uterus: Secondary | ICD-10-CM | POA: Diagnosis not present

## 2023-03-19 MED ORDER — IOHEXOL 300 MG/ML  SOLN
75.0000 mL | Freq: Once | INTRAMUSCULAR | Status: AC | PRN
  Administered 2023-03-19: 75 mL via INTRAVENOUS

## 2023-03-31 ENCOUNTER — Encounter: Payer: Self-pay | Admitting: Internal Medicine

## 2023-03-31 ENCOUNTER — Encounter (HOSPITAL_BASED_OUTPATIENT_CLINIC_OR_DEPARTMENT_OTHER): Payer: Self-pay | Admitting: Pulmonary Disease

## 2023-03-31 DIAGNOSIS — N83209 Unspecified ovarian cyst, unspecified side: Secondary | ICD-10-CM

## 2023-03-31 DIAGNOSIS — D219 Benign neoplasm of connective and other soft tissue, unspecified: Secondary | ICD-10-CM

## 2023-03-31 DIAGNOSIS — R739 Hyperglycemia, unspecified: Secondary | ICD-10-CM

## 2023-03-31 DIAGNOSIS — I10 Essential (primary) hypertension: Secondary | ICD-10-CM

## 2023-04-01 ENCOUNTER — Encounter: Payer: Self-pay | Admitting: Psychiatry

## 2023-04-01 ENCOUNTER — Encounter: Payer: Self-pay | Admitting: Internal Medicine

## 2023-04-01 NOTE — Telephone Encounter (Signed)
Called and spoke to Cecil.  Discussed her current w/up and scan.  Lab orders placed.  Ovarian cyst present on CT.  Discussed CA 125 - marker for ovary.  Discussed the uterine findings.  She is waiting further recommendations regarding chest CT. Please schedule fasting labs.  Lab orders are in EPIC.

## 2023-04-01 NOTE — Telephone Encounter (Signed)
Spoke with patient. She wants to go to [facility] to have her labs done. Labs ordered to [facility]. Patient is aware.

## 2023-04-02 NOTE — Telephone Encounter (Signed)
Received the following message from patient:   "Good afternoon, Dr. Vassie Loll.   I hope you are doing well. I wanted to see if it was possible for you to review my CT scan results and providemy with your impression of the scan. A CT scan was ordered by GYN/Onc to evaluate, if there are cancerous areas in my lungs due to results of a recent pelvic MRI. It has been identified that I have 2 fibroids and one has a questionable area that they are assuming it is cancerous. Apparently this type of cancer is fast moving and doesn't have a good prognosis. When I look at the results of the CT scan. I see there is a 4.7 x 5.5 mm solid noncalcifed area of the LLL. I wonder if this is due to my asthma.    Ariel Perez"  Patient had a CT chest with contrast on 03/19/23 that was ordered by her obgyn. Dr. Vassie Loll, can you please review the lungs report of the CT for patient? Thanks!

## 2023-04-06 ENCOUNTER — Encounter: Payer: Self-pay | Admitting: Internal Medicine

## 2023-04-06 ENCOUNTER — Other Ambulatory Visit: Payer: Self-pay | Admitting: *Deleted

## 2023-04-17 ENCOUNTER — Other Ambulatory Visit: Payer: Commercial Managed Care - PPO

## 2023-04-17 DIAGNOSIS — N83209 Unspecified ovarian cyst, unspecified side: Secondary | ICD-10-CM | POA: Diagnosis not present

## 2023-04-17 DIAGNOSIS — I10 Essential (primary) hypertension: Secondary | ICD-10-CM

## 2023-04-17 DIAGNOSIS — D219 Benign neoplasm of connective and other soft tissue, unspecified: Secondary | ICD-10-CM

## 2023-04-17 DIAGNOSIS — R739 Hyperglycemia, unspecified: Secondary | ICD-10-CM

## 2023-04-17 LAB — CBC WITH DIFFERENTIAL/PLATELET
Basophils Absolute: 0 10*3/uL (ref 0.0–0.1)
Basophils Relative: 0.9 % (ref 0.0–3.0)
Eosinophils Absolute: 0.3 10*3/uL (ref 0.0–0.7)
Eosinophils Relative: 6.6 % — ABNORMAL HIGH (ref 0.0–5.0)
HCT: 43.6 % (ref 36.0–46.0)
Hemoglobin: 14.1 g/dL (ref 12.0–15.0)
Lymphocytes Relative: 40.3 % (ref 12.0–46.0)
Lymphs Abs: 2 10*3/uL (ref 0.7–4.0)
MCHC: 32.4 g/dL (ref 30.0–36.0)
MCV: 90.6 fl (ref 78.0–100.0)
Monocytes Absolute: 0.3 10*3/uL (ref 0.1–1.0)
Monocytes Relative: 6.4 % (ref 3.0–12.0)
Neutro Abs: 2.3 10*3/uL (ref 1.4–7.7)
Neutrophils Relative %: 45.8 % (ref 43.0–77.0)
Platelets: 243 10*3/uL (ref 150.0–400.0)
RBC: 4.82 Mil/uL (ref 3.87–5.11)
RDW: 14 % (ref 11.5–15.5)
WBC: 5 10*3/uL (ref 4.0–10.5)

## 2023-04-17 LAB — HEMOGLOBIN A1C: Hgb A1c MFr Bld: 5.8 % (ref 4.6–6.5)

## 2023-04-17 LAB — LIPID PANEL
Cholesterol: 152 mg/dL (ref 0–200)
HDL: 42.9 mg/dL (ref >39.00)
LDL Cholesterol: 91 mg/dL (ref 0–99)
NonHDL: 109.01
Total CHOL/HDL Ratio: 4
Triglycerides: 89 mg/dL (ref 0.0–149.0)
VLDL: 17.8 mg/dL (ref 0.0–40.0)

## 2023-04-17 LAB — HEPATIC FUNCTION PANEL
ALT: 12 U/L (ref 0–35)
AST: 14 U/L (ref 0–37)
Albumin: 4.3 g/dL (ref 3.5–5.2)
Alkaline Phosphatase: 63 U/L (ref 39–117)
Bilirubin, Direct: 0.2 mg/dL (ref 0.0–0.3)
Total Bilirubin: 0.8 mg/dL (ref 0.2–1.2)
Total Protein: 7.5 g/dL (ref 6.0–8.3)

## 2023-04-17 LAB — BASIC METABOLIC PANEL
BUN: 12 mg/dL (ref 6–23)
CO2: 27 mEq/L (ref 19–32)
Calcium: 9.7 mg/dL (ref 8.4–10.5)
Chloride: 103 mEq/L (ref 96–112)
Creatinine, Ser: 0.98 mg/dL (ref 0.40–1.20)
GFR: 70.49 mL/min (ref >60.00)
Glucose, Bld: 107 mg/dL — ABNORMAL HIGH (ref 70–99)
Potassium: 4 mEq/L (ref 3.5–5.1)
Sodium: 139 mEq/L (ref 135–145)

## 2023-04-17 LAB — TSH: TSH: 1.21 u[IU]/mL (ref 0.35–5.50)

## 2023-04-17 NOTE — Addendum Note (Signed)
Addended by: Ilda Foil on: 04/17/2023 09:35 AM   Modules accepted: Orders

## 2023-04-17 NOTE — Addendum Note (Signed)
Addended by: Ilda Foil on: 04/17/2023 09:36 AM   Modules accepted: Orders

## 2023-05-04 ENCOUNTER — Encounter: Payer: Self-pay | Admitting: Internal Medicine

## 2023-05-04 ENCOUNTER — Ambulatory Visit: Payer: Commercial Managed Care - PPO | Admitting: Internal Medicine

## 2023-05-04 VITALS — BP 126/72 | HR 84 | Temp 97.9°F | Resp 16 | Ht 65.0 in | Wt 228.0 lb

## 2023-05-04 DIAGNOSIS — I10 Essential (primary) hypertension: Secondary | ICD-10-CM | POA: Diagnosis not present

## 2023-05-04 DIAGNOSIS — R911 Solitary pulmonary nodule: Secondary | ICD-10-CM | POA: Diagnosis not present

## 2023-05-04 DIAGNOSIS — G43809 Other migraine, not intractable, without status migrainosus: Secondary | ICD-10-CM

## 2023-05-04 DIAGNOSIS — R935 Abnormal findings on diagnostic imaging of other abdominal regions, including retroperitoneum: Secondary | ICD-10-CM

## 2023-05-04 DIAGNOSIS — J4530 Mild persistent asthma, uncomplicated: Secondary | ICD-10-CM

## 2023-05-04 DIAGNOSIS — R739 Hyperglycemia, unspecified: Secondary | ICD-10-CM | POA: Diagnosis not present

## 2023-05-04 DIAGNOSIS — F439 Reaction to severe stress, unspecified: Secondary | ICD-10-CM | POA: Diagnosis not present

## 2023-05-04 NOTE — Progress Notes (Signed)
Subjective:    Patient ID: Ariel Perez, female    DOB: 20-May-1981, 42 y.o.   MRN: [id number]  Patient here for  Chief Complaint  Patient presents with   Medical Management of Chronic Issues    HPI Here for a scheduled follow up.  Follow up regarding hypertension, hyperglycemia and asthma.  Had f/u with Dr Vassie Loll 10/2022 - asthma symptoms well controlled on symibcort. Recommended stepdown to 80.  Continue albuterol prn. Saw gyn for follow up - fibroids.  Referred to gyn onc - concern one of the uterine masses appearing more abnormal with heterogeneous enhancement on MRI. Recommended hysterectomy vs MRI pelvis in 3 months. Elected f/u MRI pelvis. Also recommended CT chest - performed 03/19/23 - revealed a 4.7 x 5.5 mm solid, noncalcified left lung lower lobe pulmonary nodule.  she discussed with Dr Vassie Loll.  He recommended CT chest in 3 months. Mammogram through gyn - result requested. Increased stress - discussed.  Stress with job concerns.     Past Medical History:  Diagnosis Date   Asthma    Fibroid    Headache    Hypertension    Migraines    Osteoarthritis    Past Surgical History:  Procedure Laterality Date   IR RADIOLOGIST EVAL & MGMT  01/13/2023   WISDOM TOOTH EXTRACTION N/A 2008   Approx x4    Family History  Problem Relation Age of Onset   Cancer Father        unknown   Hypertension Maternal Grandmother    Rheum arthritis Maternal Grandmother    Cancer Maternal Grandfather        unknown   Diabetes Paternal Grandmother    Breast cancer Neg Hx    Ovarian cancer Neg Hx    Uterine cancer Neg Hx    Prostate cancer Neg Hx    Colon cancer Neg Hx    Pancreatic cancer Neg Hx    Social History   Socioeconomic History   Marital status: Single    Spouse name: Not on file   Number of children: 0   Years of education: Not on file   Highest education level: Not on file  Occupational History   Occupation: Adult nurse primary care  Tobacco Use   Smoking status: Never    Smokeless tobacco: Never  Vaping Use   Vaping status: Never Used  Substance and Sexual Activity   Alcohol use: Not Currently   Drug use: No   Sexual activity: Yes    Partners: Male    Birth control/protection: I.U.D.    Comment: 1 partner  Other Topics Concern   Not on file  Social History Narrative   Works here at [employer] by herself    2 dogs live inside    Caffeine- Occasional    Right handed    Enjoys sleeping    Social Determinants of Health   Financial Resource Strain: Not on file  Food Insecurity: No Food Insecurity (03/06/2023)   Hunger Vital Sign    Worried About Running Out of Food in the Last Year: Never true    Ran Out of Food in the Last Year: Never true  Transportation Needs: No Transportation Needs (03/06/2023)   PRAPARE - Administrator, Civil Service (Medical): No    Lack of Transportation (Non-Medical): No  Physical Activity: Not on file  Stress: Not on file  Social Connections: Not on file     Review of Systems  Constitutional:  Negative  for appetite change and unexpected weight change.  HENT:  Negative for congestion and sinus pressure.   Respiratory:  Negative for cough and chest tightness.        Breathing overall stable.   Cardiovascular:  Negative for chest pain, palpitations and leg swelling.  Gastrointestinal:  Negative for abdominal pain, diarrhea, nausea and vomiting.  Genitourinary:  Negative for difficulty urinating and dysuria.  Musculoskeletal:  Negative for joint swelling and myalgias.  Skin:  Negative for color change and rash.  Neurological:  Negative for dizziness and headaches.  Psychiatric/Behavioral:  Negative for agitation and dysphoric mood.        Increased stress as outlined.         Objective:     BP 126/72   Pulse 84   Temp 97.9 F (36.6 C)   Resp 16   Ht 5\' 5"  (1.651 m)   Wt 228 lb (103.4 kg)   SpO2 98%   BMI 37.94 kg/m  Wt Readings from Last 3 Encounters:  05/04/23 228 lb (103.4 kg)   03/09/23 235 lb 9.6 oz (106.9 kg)  11/07/22 229 lb (103.9 kg)    Physical Exam Vitals reviewed.  Constitutional:      General: She is not in acute distress.    Appearance: Normal appearance.  HENT:     Head: Normocephalic and atraumatic.     Right Ear: External ear normal.     Left Ear: External ear normal.  Eyes:     General: No scleral icterus.       Right eye: No discharge.        Left eye: No discharge.     Conjunctiva/sclera: Conjunctivae normal.  Neck:     Thyroid: No thyromegaly.  Cardiovascular:     Rate and Rhythm: Normal rate and regular rhythm.  Pulmonary:     Effort: No respiratory distress.     Breath sounds: Normal breath sounds. No wheezing.  Abdominal:     General: Bowel sounds are normal.     Palpations: Abdomen is soft.     Tenderness: There is no abdominal tenderness.  Musculoskeletal:        General: No swelling or tenderness.     Cervical back: Neck supple. No tenderness.  Lymphadenopathy:     Cervical: No cervical adenopathy.  Skin:    Findings: No erythema or rash.  Neurological:     Mental Status: She is alert.  Psychiatric:        Mood and Affect: Mood normal.        Behavior: Behavior normal.      Outpatient Encounter Medications as of 05/04/2023  Medication Sig   albuterol (VENTOLIN HFA) 108 (90 Base) MCG/ACT inhaler INHALE 1-2 PUFFS INTO THE LUNGS EVERY 6 (SIX) HOURS AS NEEDED FOR WHEEZING OR SHORTNESS OF BREATH.   budesonide-formoterol (SYMBICORT) 160-4.5 MCG/ACT inhaler Inhale 2 puffs into the lungs in the morning and at bedtime.   budesonide-formoterol (SYMBICORT) 80-4.5 MCG/ACT inhaler Take 2 puffs first thing in the morning and then another 2 puffs about 12 hours later.   hydrochlorothiazide (HYDRODIURIL) 25 MG tablet Take 1 tablet (25 mg total) by mouth daily.   levonorgestrel (MIRENA, 52 MG,) 20 MCG/24HR IUD Mirena 20 mcg/24 hours (6 yrs) 52 mg intrauterine device  Take 1 device by intrauterine route.   montelukast (SINGULAIR)  10 MG tablet Take 1 tablet (10 mg total) by mouth at bedtime.   [DISCONTINUED] azelastine (ASTELIN) 0.1 % nasal spray Place 1 spray into both nostrils  2 (two) times daily. Use in each nostril as directed (Patient not taking: Reported on 03/06/2023)   No facility-administered encounter medications on file as of 05/04/2023.     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 RADIATION DOSE REDUCTION: This exam was performed according to the departmental dose-optimization program which includes automated exposure control, adjustment of the mA and/or kV according to patient size and/or use of iterative reconstruction technique. CONTRAST:  75mL OMNIPAQUE IOHEXOL 300 MG/ML  SOLN COMPARISON:  None Available. FINDINGS: Cardiovascular: Normal cardiac size. No pericardial effusion. No aortic aneurysm. Mediastinum/Nodes: Visualized thyroid gland appears grossly unremarkable. No solid / cystic mediastinal masses. The esophagus is nondistended precluding optimal assessment. No axillary, mediastinal or hilar lymphadenopathy by size criteria. Lungs/Pleura: The central tracheo-bronchial tree is patent. No mass or consolidation. No pleural effusion or pneumothorax. There is a 4.7 x 5.4 mm  noncalcified nodule in the left lung lower lobe. Upper Abdomen: Visualized upper abdominal viscera within normal limits. Musculoskeletal: The visualized soft tissues of the chest wall are grossly unremarkable. No suspicious osseous lesions. IMPRESSION: 1. There is a 4.7 x 5.5 mm solid, noncalcified left lung lower lobe pulmonary nodule. This is nonspecific and may be infectious, inflammatory, or neoplastic in etiology. Electronically Signed   By: Jules Schick M.D.   On: 03/25/2023 14:47       Assessment & Plan:  Essential hypertension Assessment & Plan: Blood pressure better on hctz 25mg  q day.  Continue to follow pressures.  Follow metabolic panel.     Hyperglycemia Assessment & Plan: Low carb diet and exercise. Will follow met b and a1c.   Lab Results  Component Value Date   HGBA1C 5.8 04/17/2023     Other migraine without status migrainosus, not intractable Assessment & Plan: Stable. No problems reported.    Mild persistent asthma without complication Assessment & Plan:  Had f/u with Dr Vassie Loll 10/2022 - asthma symptoms well controlled on symibcort. Recommended stepdown to 80.  Continue albuterol prn.    Abnormal MRI, pelvis Assessment & Plan: Saw gyn for follow up - fibroids.  Referred to gyn onc - concern one of the uterine masses appearing more abnormal with heterogeneous enhancement on MRI. Recommended hysterectomy vs MRI pelvis in 3 months. Elected f/u MRI pelvis.   Lung nodule Assessment & Plan: Saw gyn for follow up - fibroids.  Referred to gyn onc - concern one of the uterine masses appearing more abnormal with heterogeneous enhancement on MRI. Recommended hysterectomy vs MRI pelvis in 3 months. Elected f/u MRI pelvis. Also recommended CT chest - performed 03/19/23 - revealed a 4.7 x 5.5 mm solid, noncalcified left lung lower lobe  pulmonary nodule.  she discussed with Dr Vassie Loll.  He recommended CT chest in 3 months.    Stress Assessment & Plan: Discussed.  Does not feel  needs any further intervention at this time.  Follow.       Dale [employer], MD

## 2023-05-05 ENCOUNTER — Telehealth: Payer: Self-pay

## 2023-05-05 NOTE — Telephone Encounter (Signed)
Patient called and stated that she needed to change her MRI that was scheduled for 9/17.  She stated her Dr is being eliminated from [facility] and she is losing coverage.  She needs the MRI done before 9/6.. I gave her the phone number to centralized scheduling for her to call and have it changed to her convenience.  Patient confirmed and understood.

## 2023-05-06 ENCOUNTER — Other Ambulatory Visit: Payer: Self-pay

## 2023-05-10 ENCOUNTER — Encounter: Payer: Self-pay | Admitting: Internal Medicine

## 2023-05-10 DIAGNOSIS — R935 Abnormal findings on diagnostic imaging of other abdominal regions, including retroperitoneum: Secondary | ICD-10-CM | POA: Insufficient documentation

## 2023-05-10 DIAGNOSIS — R911 Solitary pulmonary nodule: Secondary | ICD-10-CM | POA: Insufficient documentation

## 2023-05-10 DIAGNOSIS — F439 Reaction to severe stress, unspecified: Secondary | ICD-10-CM | POA: Insufficient documentation

## 2023-05-10 NOTE — Assessment & Plan Note (Signed)
Blood pressure better on hctz 25mg q day.  Continue to follow pressures.  Follow metabolic panel.   

## 2023-05-10 NOTE — Assessment & Plan Note (Signed)
Low carb diet and exercise. Will follow met b and a1c.   Lab Results  Component Value Date   HGBA1C 5.8 04/17/2023

## 2023-05-10 NOTE — Assessment & Plan Note (Addendum)
Stable. No problems reported.

## 2023-05-10 NOTE — Assessment & Plan Note (Signed)
Saw gyn for follow up - fibroids.  Referred to gyn onc - concern one of the uterine masses appearing more abnormal with heterogeneous enhancement on MRI. Recommended hysterectomy vs MRI pelvis in 3 months. Elected f/u MRI pelvis.

## 2023-05-10 NOTE — Assessment & Plan Note (Signed)
Saw gyn for follow up - fibroids.  Referred to gyn onc - concern one of the uterine masses appearing more abnormal with heterogeneous enhancement on MRI. Recommended hysterectomy vs MRI pelvis in 3 months. Elected f/u MRI pelvis. Also recommended CT chest - performed 03/19/23 - revealed a 4.7 x 5.5 mm solid, noncalcified left lung lower lobe pulmonary nodule.  she discussed with Dr Vassie Loll.  He recommended CT chest in 3 months.

## 2023-05-10 NOTE — Assessment & Plan Note (Signed)
Discussed.  Does not feel needs any further intervention at this time.  Follow.  ?

## 2023-05-10 NOTE — Assessment & Plan Note (Signed)
Had f/u with Dr Vassie Loll 10/2022 - asthma symptoms well controlled on symibcort. Recommended stepdown to 80.  Continue albuterol prn.

## 2023-05-12 ENCOUNTER — Other Ambulatory Visit (HOSPITAL_COMMUNITY): Payer: Self-pay

## 2023-05-26 ENCOUNTER — Ambulatory Visit (HOSPITAL_COMMUNITY)
Admission: RE | Admit: 2023-05-26 | Discharge: 2023-05-26 | Disposition: A | Discharge status: Home / Self Care | Payer: Commercial Managed Care - PPO | Source: Ambulatory Visit | Attending: Psychiatry | Admitting: Psychiatry

## 2023-05-26 DIAGNOSIS — N83202 Unspecified ovarian cyst, left side: Secondary | ICD-10-CM | POA: Diagnosis not present

## 2023-05-26 DIAGNOSIS — D259 Leiomyoma of uterus, unspecified: Secondary | ICD-10-CM | POA: Diagnosis not present

## 2023-05-26 DIAGNOSIS — D219 Benign neoplasm of connective and other soft tissue, unspecified: Secondary | ICD-10-CM | POA: Insufficient documentation

## 2023-05-26 DIAGNOSIS — N858 Other specified noninflammatory disorders of uterus: Secondary | ICD-10-CM | POA: Diagnosis not present

## 2023-05-26 DIAGNOSIS — N83201 Unspecified ovarian cyst, right side: Secondary | ICD-10-CM | POA: Diagnosis not present

## 2023-05-26 MED ORDER — GADOBUTROL 1 MMOL/ML IV SOLN
10.0000 mL | Freq: Once | INTRAVENOUS | Status: AC | PRN
  Administered 2023-05-26: 10 mL via INTRAVENOUS

## 2023-05-27 ENCOUNTER — Other Ambulatory Visit (HOSPITAL_COMMUNITY): Payer: Self-pay

## 2023-06-09 ENCOUNTER — Other Ambulatory Visit (HOSPITAL_COMMUNITY): Payer: Commercial Managed Care - PPO

## 2023-06-10 ENCOUNTER — Telehealth: Payer: Self-pay

## 2023-06-10 NOTE — Telephone Encounter (Signed)
I called patient to update information for upcoming appointment on Monday 9/23 with Dr. Alvester Morin. She states she has to cancel appointment for now d/t she no longer has insurance. She will call back and schedule once she gets insurance.   Dr. Alvester Morin notified.

## 2023-06-15 ENCOUNTER — Inpatient Hospital Stay: Payer: Commercial Managed Care - PPO | Admitting: Psychiatry

## 2023-06-16 NOTE — Progress Notes (Signed)
This encounter was created in error - please disregard.

## 2023-07-01 ENCOUNTER — Other Ambulatory Visit (HOSPITAL_COMMUNITY): Payer: Self-pay

## 2023-09-03 ENCOUNTER — Ambulatory Visit: Payer: Commercial Managed Care - PPO | Admitting: Internal Medicine

## 2023-09-03 VITALS — BP 130/86 | HR 82 | Temp 98.2°F | Ht 65.0 in | Wt 239.0 lb

## 2023-09-03 DIAGNOSIS — J4530 Mild persistent asthma, uncomplicated: Secondary | ICD-10-CM | POA: Diagnosis not present

## 2023-09-03 DIAGNOSIS — R935 Abnormal findings on diagnostic imaging of other abdominal regions, including retroperitoneum: Secondary | ICD-10-CM

## 2023-09-03 DIAGNOSIS — F439 Reaction to severe stress, unspecified: Secondary | ICD-10-CM

## 2023-09-03 DIAGNOSIS — R739 Hyperglycemia, unspecified: Secondary | ICD-10-CM | POA: Diagnosis not present

## 2023-09-03 DIAGNOSIS — I10 Essential (primary) hypertension: Secondary | ICD-10-CM | POA: Diagnosis not present

## 2023-09-03 DIAGNOSIS — R062 Wheezing: Secondary | ICD-10-CM

## 2023-09-03 DIAGNOSIS — R911 Solitary pulmonary nodule: Secondary | ICD-10-CM

## 2023-09-03 LAB — BASIC METABOLIC PANEL
BUN: 10 mg/dL (ref 6–23)
CO2: 28 meq/L (ref 19–32)
Calcium: 9.5 mg/dL (ref 8.4–10.5)
Chloride: 104 meq/L (ref 96–112)
Creatinine, Ser: 0.98 mg/dL (ref 0.40–1.20)
GFR: 70.3 mL/min (ref >60.00)
Glucose, Bld: 93 mg/dL (ref 70–99)
Potassium: 3.9 meq/L (ref 3.5–5.1)
Sodium: 139 meq/L (ref 135–145)

## 2023-09-03 LAB — HEPATIC FUNCTION PANEL
ALT: 17 U/L (ref 0–35)
AST: 21 U/L (ref 0–37)
Albumin: 4.3 g/dL (ref 3.5–5.2)
Alkaline Phosphatase: 63 U/L (ref 39–117)
Bilirubin, Direct: 0.1 mg/dL (ref 0.0–0.3)
Total Bilirubin: 0.7 mg/dL (ref 0.2–1.2)
Total Protein: 7.6 g/dL (ref 6.0–8.3)

## 2023-09-03 LAB — LIPID PANEL
Cholesterol: 155 mg/dL (ref 0–200)
HDL: 46.9 mg/dL (ref >39.00)
LDL Cholesterol: 91 mg/dL (ref 0–99)
NonHDL: 107.9
Total CHOL/HDL Ratio: 3
Triglycerides: 86 mg/dL (ref 0.0–149.0)
VLDL: 17.2 mg/dL (ref 0.0–40.0)

## 2023-09-03 LAB — HEMOGLOBIN A1C: Hgb A1c MFr Bld: 6.2 % (ref 4.6–6.5)

## 2023-09-03 MED ORDER — HYDROCHLOROTHIAZIDE 25 MG PO TABS: 25.0000 mg | ORAL_TABLET | Freq: Every day | ORAL | 3 refills | Status: DC

## 2023-09-03 MED ORDER — PREDNISONE 10 MG PO TABS: ORAL_TABLET | ORAL | 0 refills | Status: DC

## 2023-09-03 MED ORDER — BUDESONIDE-FORMOTEROL FUMARATE 160-4.5 MCG/ACT IN AERO: 2.0000 | INHALATION_SPRAY | Freq: Two times a day (BID) | RESPIRATORY_TRACT | 0 refills | Status: DC

## 2023-09-03 NOTE — Progress Notes (Signed)
Subjective:    Patient ID: Ariel Perez, female    DOB: 08/27/1981, 42 y.o.   MRN: [id number]  Patient here for  Chief Complaint  Patient presents with   Medical Management of Chronic Issues    4 month f/u    HPI Here for a scheduled follow up - Follow up regarding hypertension, hyperglycemia and asthma. Had f/u with Dr Vassie Loll 10/2022 - asthma symptoms well controlled on symibcort. Her symbicort dose was decreased to 80. Reports recently noticing some increased congestion/wheezing. No fever. Discussed medication, including astelin and increasing symbicort. Saw gyn for follow up - fibroids.  Referred to gyn onc - concern one of the uterine masses appearing more abnormal with heterogeneous enhancement on MRI. Recommended hysterectomy vs MRI pelvis in 3 months. Elected f/u MRI pelvis. Also recommended CT chest - performed 03/19/23 - revealed a 4.7 x 5.5 mm solid, noncalcified left lung lower lobe pulmonary nodule.  she discussed with Dr Vassie Loll.  He recommended CT chest in 3 months. Reports had f/u with gyn. States - findings on scan were c/w degenerating fibroid. Reported no further w/up warranted. She has changed jobs. Handling stress. Planning to get more into a regular exercise routine.    Past Medical History:  Diagnosis Date   Asthma    Fibroid    Headache    Hypertension    Migraines    Osteoarthritis    Past Surgical History:  Procedure Laterality Date   IR RADIOLOGIST EVAL & MGMT  01/13/2023   WISDOM TOOTH EXTRACTION N/A 2008   Approx x4    Family History  Problem Relation Age of Onset   Cancer Father        unknown   Hypertension Maternal Grandmother    Rheum arthritis Maternal Grandmother    Cancer Maternal Grandfather        unknown   Diabetes Paternal Grandmother    Breast cancer Neg Hx    Ovarian cancer Neg Hx    Uterine cancer Neg Hx    Prostate cancer Neg Hx    Colon cancer Neg Hx    Pancreatic cancer Neg Hx    Social History   Socioeconomic History    Marital status: Single    Spouse name: Not on file   Number of children: 0   Years of education: Not on file   Highest education level: Bachelor's degree (e.g., BA, AB, BS)  Occupational History   Occupation: Adult nurse primary care  Tobacco Use   Smoking status: Never   Smokeless tobacco: Never  Vaping Use   Vaping status: Never Used  Substance and Sexual Activity   Alcohol use: Not Currently   Drug use: No   Sexual activity: Yes    Partners: Male    Birth control/protection: I.U.D.    Comment: 1 partner  Other Topics Concern   Not on file  Social History Narrative   Works here at [employer] by herself    2 dogs live inside    Caffeine- Occasional    Right handed    Enjoys sleeping    Social Drivers of Health   Financial Resource Strain: Low Risk  (09/02/2023)   Overall Financial Resource Strain (CARDIA)    Difficulty of Paying Living Expenses: Not hard at all  Food Insecurity: No Food Insecurity (09/02/2023)   Hunger Vital Sign    Worried About Running Out of Food in the Last Year: Never true    Ran Out of Food in the  Last Year: Never true  Transportation Needs: No Transportation Needs (09/02/2023)   PRAPARE - Administrator, Civil Service (Medical): No    Lack of Transportation (Non-Medical): No  Physical Activity: Insufficiently Active (09/02/2023)   Exercise Vital Sign    Days of Exercise per Week: 3 days    Minutes of Exercise per Session: 30 min  Stress: No Stress Concern Present (09/02/2023)   [employer] of Occupational Health - Occupational Stress Questionnaire    Feeling of Stress : Not at all  Social Connections: Moderately Isolated (09/02/2023)   Social Connection and Isolation Panel [NHANES]    Frequency of Communication with Friends and Family: More than three times a week    Frequency of Social Gatherings with Friends and Family: Once a week    Attends Religious Services: More than 4 times per year    Active Member of [employer] or  Organizations: No    Attends Engineer, structural: Not on file    Marital Status: Never married     Review of Systems  Constitutional:  Negative for appetite change and unexpected weight change.  HENT:  Positive for congestion. Negative for sinus pressure.   Respiratory:  Positive for cough and wheezing. Negative for chest tightness and shortness of breath.   Cardiovascular:  Negative for chest pain and palpitations.  Gastrointestinal:  Negative for abdominal pain, diarrhea, nausea and vomiting.  Genitourinary:  Negative for difficulty urinating and dysuria.  Musculoskeletal:  Negative for joint swelling and myalgias.  Skin:  Negative for color change and rash.  Neurological:  Negative for dizziness and headaches.  Psychiatric/Behavioral:  Negative for agitation and dysphoric mood.        Objective:     BP 130/86   Pulse 82   Temp 98.2 F (36.8 C)   Ht 5\' 5"  (1.651 m)   Wt 239 lb (108.4 kg)   SpO2 98%   BMI 39.77 kg/m  Wt Readings from Last 3 Encounters:  09/03/23 239 lb (108.4 kg)  05/04/23 228 lb (103.4 kg)  03/09/23 235 lb 9.6 oz (106.9 kg)    Physical Exam Vitals reviewed.  Constitutional:      General: She is not in acute distress.    Appearance: Normal appearance.  HENT:     Head: Normocephalic and atraumatic.     Right Ear: External ear normal.     Left Ear: External ear normal.  Eyes:     General: No scleral icterus.       Right eye: No discharge.        Left eye: No discharge.     Conjunctiva/sclera: Conjunctivae normal.  Neck:     Thyroid: No thyromegaly.  Cardiovascular:     Rate and Rhythm: Normal rate and regular rhythm.  Pulmonary:     Comments: Wheezing noted with increased deep breathing. Some increased cough with forced expiration.  Abdominal:     General: Bowel sounds are normal.     Palpations: Abdomen is soft.     Tenderness: There is no abdominal tenderness.  Musculoskeletal:        General: No swelling or tenderness.      Cervical back: Neck supple. No tenderness.  Lymphadenopathy:     Cervical: No cervical adenopathy.  Skin:    Findings: No erythema or rash.  Neurological:     Mental Status: She is alert.  Psychiatric:        Mood and Affect: Mood normal.  Behavior: Behavior normal.      Outpatient Encounter Medications as of 09/03/2023  Medication Sig   albuterol (VENTOLIN HFA) 108 (90 Base) MCG/ACT inhaler INHALE 1-2 PUFFS INTO THE LUNGS EVERY 6 (SIX) HOURS AS NEEDED FOR WHEEZING OR SHORTNESS OF BREATH.   budesonide-formoterol (SYMBICORT) 80-4.5 MCG/ACT inhaler Take 2 puffs first thing in the morning and then another 2 puffs about 12 hours later.   levonorgestrel (MIRENA, 52 MG,) 20 MCG/24HR IUD Mirena 20 mcg/24 hours (6 yrs) 52 mg intrauterine device  Take 1 device by intrauterine route.   montelukast (SINGULAIR) 10 MG tablet Take 1 tablet (10 mg total) by mouth at bedtime.   predniSONE (DELTASONE) 10 MG tablet Take 4 tablets x 1 day and then decrease by 1/2 tablet per day until down to zero mg.   budesonide-formoterol (SYMBICORT) 160-4.5 MCG/ACT inhaler Inhale 2 puffs into the lungs in the morning and at bedtime.   hydrochlorothiazide (HYDRODIURIL) 25 MG tablet Take 1 tablet (25 mg total) by mouth daily.   [DISCONTINUED] budesonide-formoterol (SYMBICORT) 160-4.5 MCG/ACT inhaler Inhale 2 puffs into the lungs in the morning and at bedtime.   [DISCONTINUED] hydrochlorothiazide (HYDRODIURIL) 25 MG tablet Take 1 tablet (25 mg total) by mouth daily.   No facility-administered encounter medications on file as of 09/03/2023.     Lab Results  Component Value Date   WBC 5.0 04/17/2023   HGB 14.1 04/17/2023   HCT 43.6 04/17/2023   PLT 243.0 04/17/2023   GLUCOSE 93 09/03/2023   CHOL 155 09/03/2023   TRIG 86.0 09/03/2023   HDL 46.90 09/03/2023   LDLCALC 91 09/03/2023   ALT 17 09/03/2023   AST 21 09/03/2023   NA 139 09/03/2023   K 3.9 09/03/2023   CL 104 09/03/2023   CREATININE 0.98  09/03/2023   BUN 10 09/03/2023   CO2 28 09/03/2023   TSH 1.21 04/17/2023   HGBA1C 6.2 09/03/2023    MR Pelvis W Wo Contrast Result Date: 06/01/2023 CLINICAL DATA:  Fibroid surveillance EXAM: MRI PELVIS WITHOUT AND WITH CONTRAST TECHNIQUE: Multiplanar multisequence MR imaging of the pelvis was performed both before and after administration of intravenous contrast. CONTRAST:  10mL GADAVIST GADOBUTROL 1 MMOL/ML IV SOLN COMPARISON:  01/10/2023 FINDINGS: Urinary Tract: Bladder is compressed by adjacent fibroid bulk but otherwise unremarkable. Bowel:  Unremarkable visualized pelvic bowel loops. Vascular/Lymphatic: No pathologically enlarged lymph nodes. No significant vascular abnormality seen. Reproductive: Bulky fibroid uterus, extending well into the lower abdomen, overall dimensions at least 19.6 by 11.0 x 11.4 cm, not significantly changed compared to prior examination (series 6, image 20, series 3, image 18). Largest fibroid is again a submucosal fibroid of the posterior uterine body and fundus measuring 9.6 x 9.1 x 8.7 cm, this fibroid with heterogeneous internal hypoenhancement suggesting partial degeneration, and there is an additional large, exophytic fibroid arising from the fundus measuring 9.1 x 8.8 x 7.0 cm. The endometrial cavity is effaced by submucosal fibroids. Small, benign bilateral ovarian cysts and follicles, requiring no further follow-up or characterization. Other:  None. Musculoskeletal: No suspicious bone lesions identified. IMPRESSION: 1. Bulky fibroid uterus, extending well into the lower abdomen, overall dimensions at least 19.6 by 11.0 x 11.4 cm, not significantly changed compared to prior examination. 2. Largest fibroid is again a submucosal fibroid of the posterior uterine body and fundus measuring 9.6 x 9.1 x 8.7 cm, not significantly changed, this fibroid with heterogeneous internal hypoenhancement suggesting partial degeneration, and there is an additional large, exophytic fibroid  arising from the fundus  measuring 9.1 x 8.8 x 7.0 cm. Electronically Signed   By: Jearld Lesch M.D.   On: 06/01/2023 12:53       Assessment & Plan:  Hyperglycemia Assessment & Plan: Low carb diet and exercise. Will follow met b and a1c.   Lab Results  Component Value Date   HGBA1C 6.2 09/03/2023    Orders: -     Hemoglobin A1c  Essential hypertension Assessment & Plan: Blood pressure better on hctz 25mg  q day.  Continue to follow pressures.  Follow metabolic panel.    Orders: -     Lipid panel -     Hepatic function panel -     Basic metabolic panel  Wheezing Assessment & Plan: Increased cough, congestion and wheezing. Will increase symbicort to 160. Continue singulair. Has albuterol inhaler to use prn. Astelin nasal spray. Prednisone taper as directed.  Follow.  Call with update.    Stress Assessment & Plan: Discussed.  Does not feel needs any further intervention at this time.  Follow.  Overall appears to be doing better.    Mild persistent asthma without complication Assessment & Plan: Seeing Dr Vassie Loll. Increase symbicort to 160 with flare.  Prednisone as directed.  Follow.    Lung nodule Assessment & Plan: Saw gyn for follow up - fibroids.  Referred to gyn onc - concern one of the uterine masses appearing more abnormal with heterogeneous enhancement on MRI. Recommended hysterectomy vs MRI pelvis in 3 months. Elected f/u MRI pelvis. Also recommended CT chest - performed 03/19/23 - revealed a 4.7 x 5.5 mm solid, noncalcified left lung lower lobe pulmonary nodule.  she discussed with Dr Vassie Loll.  CT chest - performed 03/19/23 - revealed a 4.7 x 5.5 mm solid, noncalcified left lung lower lobe pulmonary nodule.  she discussed with Dr Vassie Loll.  He recommended CT chest in 3 months.   Abnormal MRI, pelvis Assessment & Plan:  Saw gyn for follow up - fibroids.  Referred to gyn onc - concern one of the uterine masses appearing more abnormal with heterogeneous enhancement on MRI.  Recommended hysterectomy vs MRI pelvis in 3 months. Elected f/u MRI pelvis. Reports had f/u with gyn. States - findings on scan were c/w degenerating fibroid. Reported no further w/up warranted. Need to obtain records.    Other orders -     hydroCHLOROthiazide; Take 1 tablet (25 mg total) by mouth daily.  Dispense: 90 tablet; Refill: 3 -     Budesonide-Formoterol Fumarate; Inhale 2 puffs into the lungs in the morning and at bedtime.  Dispense: 10.2 g; Refill: 0 -     predniSONE; Take 4 tablets x 1 day and then decrease by 1/2 tablet per day until down to zero mg.  Dispense: 18 tablet; Refill: 0     Dale [place], MD

## 2023-09-06 ENCOUNTER — Encounter: Payer: Self-pay | Admitting: Internal Medicine

## 2023-09-06 NOTE — Assessment & Plan Note (Signed)
Increased cough, congestion and wheezing. Will increase symbicort to 160. Continue singulair. Has albuterol inhaler to use prn. Astelin nasal spray. Prednisone taper as directed.  Follow.  Call with update.

## 2023-09-06 NOTE — Assessment & Plan Note (Signed)
Discussed.  Does not feel needs any further intervention at this time.  Follow.  Overall appears to be doing better.

## 2023-09-06 NOTE — Assessment & Plan Note (Signed)
Seeing Dr Vassie Loll. Increase symbicort to 160 with flare.  Prednisone as directed.  Follow.

## 2023-09-06 NOTE — Assessment & Plan Note (Addendum)
Saw gyn for follow up - fibroids.  Referred to gyn onc - concern one of the uterine masses appearing more abnormal with heterogeneous enhancement on MRI. Recommended hysterectomy vs MRI pelvis in 3 months. Elected f/u MRI pelvis. Reports had f/u with gyn. States - findings on scan were c/w degenerating fibroid. Reported no further w/up warranted. Need to obtain records.

## 2023-09-06 NOTE — Assessment & Plan Note (Signed)
Blood pressure better on hctz 25mg q day.  Continue to follow pressures.  Follow metabolic panel.   

## 2023-09-06 NOTE — Assessment & Plan Note (Signed)
Saw gyn for follow up - fibroids.  Referred to gyn onc - concern one of the uterine masses appearing more abnormal with heterogeneous enhancement on MRI. Recommended hysterectomy vs MRI pelvis in 3 months. Elected f/u MRI pelvis. Also recommended CT chest - performed 03/19/23 - revealed a 4.7 x 5.5 mm solid, noncalcified left lung lower lobe pulmonary nodule.  she discussed with Dr Vassie Loll.  CT chest - performed 03/19/23 - revealed a 4.7 x 5.5 mm solid, noncalcified left lung lower lobe pulmonary nodule.  she discussed with Dr Vassie Loll.  He recommended CT chest in 3 months.

## 2023-09-06 NOTE — Assessment & Plan Note (Signed)
Low carb diet and exercise. Will follow met b and a1c.   Lab Results  Component Value Date   HGBA1C 6.2 09/03/2023

## 2023-10-27 ENCOUNTER — Encounter: Payer: Self-pay | Admitting: Internal Medicine

## 2023-10-27 ENCOUNTER — Encounter (HOSPITAL_BASED_OUTPATIENT_CLINIC_OR_DEPARTMENT_OTHER): Payer: Self-pay | Admitting: Pulmonary Disease

## 2023-10-27 ENCOUNTER — Ambulatory Visit (HOSPITAL_BASED_OUTPATIENT_CLINIC_OR_DEPARTMENT_OTHER): Payer: 59 | Admitting: Pulmonary Disease

## 2023-10-27 VITALS — BP 132/78 | HR 96 | Ht 65.0 in | Wt 233.3 lb

## 2023-10-27 DIAGNOSIS — J4540 Moderate persistent asthma, uncomplicated: Secondary | ICD-10-CM | POA: Diagnosis not present

## 2023-10-27 DIAGNOSIS — J45909 Unspecified asthma, uncomplicated: Secondary | ICD-10-CM | POA: Insufficient documentation

## 2023-10-27 DIAGNOSIS — J4530 Mild persistent asthma, uncomplicated: Secondary | ICD-10-CM

## 2023-10-27 MED ORDER — FLUTICASONE FUROATE-VILANTEROL 200-25 MCG/ACT IN AEPB: 1.0000 | INHALATION_SPRAY | Freq: Every day | RESPIRATORY_TRACT | 5 refills | Status: AC

## 2023-10-27 NOTE — Patient Instructions (Signed)
X trial of breo 200 -once daily , INSTEAD of symbicort  X Refill on astelin  X Rx for chlortrimeton 4-8 mg at bedtime instead of zyrtec for post nasal drip x 2 weeks

## 2023-10-27 NOTE — Progress Notes (Signed)
 Subjective:    Patient ID: Ariel Perez, female    DOB: 03/10/1981, 43 y.o.   MRN: [id number]  HPI  43 yo never smoker, for FU of adult onset asthma , since 2022 She was referred to pulmonary clinic in [facility] 05/2021 for new onset wheezing since November 2021.  She has a history of recurrent sinus infections which generally require an antibiotic and prednisone  course to improve perennial allergies for which she takes Zyrtec and Flonase  She was with [employer] now with [employer] -she moved to an older home built in 1972 3 years ago, she has 2 dogs -age 55 years   Initial OV 02/2022 >>intensify nasal regimen by adding azelastine  to Zyrtec and Flonase, regimen of Symbicort  as needed 05/2022 step up to Symbicort  160    Discussed the use of AI scribe software for clinical note transcription with the patient, who gave verbal consent to proceed.  History of Present Illness   The patient, with a history of asthma, presents with a recent exacerbation. She reports that she has been wheezing and sounding 'junky.' She was seen by another provider in December, who increased her Symbicort  to 180 and prescribed prednisone . The patient's symptoms improved for a while, but she admits to not taking her Symbicort  as prescribed recently, leading to a return of wheezing. She attributes this to increased personal stress. She also reports increased postnasal drip, particularly in the mornings, which sometimes leads to coughing due to phlegm in the back of her throat. She has been alternating between Flonase and Astelin  for this issue. She continues to take Singulair  at night.           Significant tests/ events reviewed   05/2022 FENO 57 Spirometry 02/2022 no airway obstruction, ratio 85, FEV1 99% 06/07/2021 >  Eos 700 /  IgE  63 06/2022 eos 300              RAST neg IgE 69   11/2015 HST >>neg   Review of Systems neg for any significant sore throat, dysphagia, itching, sneezing, nasal  congestion or excess/ purulent secretions, fever, chills, sweats, unintended wt loss, pleuritic or exertional cp, hempoptysis, orthopnea pnd or change in chronic leg swelling. Also denies presyncope, palpitations, heartburn, abdominal pain, nausea, vomiting, diarrhea or change in bowel or urinary habits, dysuria,hematuria, rash, arthralgias, visual complaints, headache, numbness weakness or ataxia.     Objective:   Physical Exam  Gen. Pleasant, obese, in no distress ENT - no lesions, no post nasal drip Neck: No JVD, no thyromegaly, no carotid bruits Lungs: no use of accessory muscles, no dullness to percussion, decreased without rales or rhonchi  Cardiovascular: Rhythm regular, heart sounds  normal, no murmurs or gallops, no peripheral edema Musculoskeletal: No deformities, no cyanosis or clubbing , no tremors       Assessment & Plan:     Assessment and Plan    Asthma, moderate persistent Ongoing wheezing and suboptimal control of asthma symptoms. Inconsistent use of Symbicort  180 mcg leading to symptom recurrence. Previous exacerbation in December treated with prednisone  and increased Symbicort  dose. Considering Breo 200 mcg for better compliance and control. Discussed Breo 200 mcg as a once-daily inhaler to improve adherence. If inadequate control, Trelegy could be considered as step-up therapy. - Switch to Breo 200 mcg once daily - Continue Singulair  at night - Follow up in six months to reassess asthma control  Postnasal drip Increased postnasal drip, especially in the mornings, causing coughing and phlegm.  Currently using Astelin  and Flonase intermittently. Zyrtec may have lost efficacy due to long-term use. Discussed switching to chlorpheniramine at night for better control. - Switch from Zyrtec to chlorpheniramine at night - Continue Flonase and Astelin  as needed  Follow-up - Follow up in six months to reassess asthma control and postnasal drip management.

## 2023-10-29 ENCOUNTER — Encounter (HOSPITAL_BASED_OUTPATIENT_CLINIC_OR_DEPARTMENT_OTHER): Payer: Self-pay | Admitting: Pulmonary Disease

## 2023-10-29 MED ORDER — AZELASTINE HCL 0.1 % NA SOLN: 2.0000 | Freq: Two times a day (BID) | NASAL | 5 refills | Status: AC

## 2023-11-27 ENCOUNTER — Telehealth: Admitting: Physician Assistant

## 2023-11-27 DIAGNOSIS — B029 Zoster without complications: Secondary | ICD-10-CM

## 2023-11-27 MED ORDER — VALACYCLOVIR HCL 1 G PO TABS: 1000.0000 mg | ORAL_TABLET | Freq: Three times a day (TID) | ORAL | 0 refills | Status: AC

## 2023-11-27 NOTE — Progress Notes (Signed)
 Virtual Visit Consent   Ariel Perez, you are scheduled for a virtual visit with a [facility] provider today. Just as with appointments in the office, your consent must be obtained to participate. Your consent will be active for this visit and any virtual visit you may have with one of our providers in the next 365 days. If you have a MyChart account, a copy of this consent can be sent to you electronically.  As this is a virtual visit, video technology does not allow for your provider to perform a traditional examination. This may limit your provider's ability to fully assess your condition. If your provider identifies any concerns that need to be evaluated in person or the need to arrange testing (such as labs, EKG, etc.), we will make arrangements to do so. Although advances in technology are sophisticated, we cannot ensure that it will always work on either your end or our end. If the connection with a video visit is poor, the visit may have to be switched to a telephone visit. With either a video or telephone visit, we are not always able to ensure that we have a secure connection.  By engaging in this virtual visit, you consent to the provision of healthcare and authorize for your insurance to be billed (if applicable) for the services provided during this visit. Depending on your insurance coverage, you may receive a charge related to this service.  I need to obtain your verbal consent now. Are you willing to proceed with your visit today? Ariel Perez has provided verbal consent on 11/27/2023 for a virtual visit (video or telephone). Margaretann Loveless, PA-C  Date: 11/27/2023 11:52 AM   Virtual Visit via Video Note   I, Margaretann Loveless, connected with  Ariel Perez  ([id number], 14-Sep-1981) on 11/27/23 at 11:45 AM EST by a video-enabled telemedicine application and verified that I am speaking with the correct person using two identifiers.  Location: Patient: Virtual  Visit Location Patient: Home Provider: Virtual Visit Location Provider: Home Office   I discussed the limitations of evaluation and management by telemedicine and the availability of in person appointments. The patient expressed understanding and agreed to proceed.    History of Present Illness: Ariel Perez is a 43 y.o. who identifies as a female who was assigned female at birth, and is being seen today for possible shingles.   Having pain and burning sensation on the left flank area under bra-line area. No rash. Skin feels hypersensitive.   Started 2.5 weeks ago.  Did have chicken pox as a child. Has not been vaccinated against shingles due to age.   Never had shingles before.   Did start Breo about a month ago.   Does report having an emotional and stressful start to the year.   Problems:  Patient Active Problem List   Diagnosis Date Noted   Asthma 10/27/2023   Abnormal MRI, pelvis 05/10/2023   Lung nodule 05/10/2023   Stress 05/10/2023   Voice hoarseness 10/29/2021   Mild persistent asthma 06/07/2021   Healthcare maintenance 03/20/2021   Abnormal mammogram 02/24/2021   Fibroids 10/27/2020   Wheezing 10/25/2020   Hyperglycemia 11/27/2018   Intractable migraine without aura and with status migrainosus 01/28/2016   Allergic reaction 12/14/2015   Acute confusional migraine 11/16/2015   Intractable migraine with aura with status migrainosus 11/16/2015   Sleep related headaches 11/16/2015   BMI 37.0-37.9, adult 11/16/2015   Benign essential HTN 07/15/2015   Chronic migraine  without aura without status migrainosus, not intractable 05/09/2015   Thoracic back pain 04/10/2015   Hyponatremia 04/10/2015   Synovitis of knee 02/20/2013   Pap smear abnormality of cervix/human papillomavirus (HPV) positive 01/20/2012   Screening for diabetes mellitus 10/09/2011   Cluster headache syndrome 10/09/2011   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   Essential hypertension 10/09/2011    Migraine, unspecified, not intractable, without status migrainosus 10/09/2011   Pain in joint involving lower leg 10/09/2011    Allergies:  Allergies  Allergen Reactions   Garcinia Mangostana Extract [Garcinia Cambogia]     Itchy throat    Medications:  Current Outpatient Medications:    valACYclovir (VALTREX) 1000 MG tablet, Take 1 tablet (1,000 mg total) by mouth 3 (three) times daily for 7 days., Disp: 21 tablet, Rfl: 0   albuterol (VENTOLIN HFA) 108 (90 Base) MCG/ACT inhaler, INHALE 1-2 PUFFS INTO THE LUNGS EVERY 6 (SIX) HOURS AS NEEDED FOR WHEEZING OR SHORTNESS OF BREATH., Disp: 6.7 g, Rfl: 4   azelastine (ASTELIN) 0.1 % nasal spray, Place 2 sprays into both nostrils 2 (two) times daily. Use in each nostril as directed, Disp: 30 mL, Rfl: 5   COMIRNATY syringe, , Disp: , Rfl:    fluticasone furoate-vilanterol (BREO ELLIPTA) 200-25 MCG/ACT AEPB, Inhale 1 puff into the lungs daily., Disp: 30 each, Rfl: 5   FLUZONE 0.5 ML injection, , Disp: , Rfl:    hydrochlorothiazide (HYDRODIURIL) 25 MG tablet, Take 1 tablet (25 mg total) by mouth daily., Disp: 90 tablet, Rfl: 3   levonorgestrel (MIRENA, 52 MG,) 20 MCG/24HR IUD, Mirena 20 mcg/24 hours (6 yrs) 52 mg intrauterine device  Take 1 device by intrauterine route., Disp: , Rfl:    montelukast (SINGULAIR) 10 MG tablet, Take 1 tablet (10 mg total) by mouth at bedtime., Disp: 30 tablet, Rfl: 5   predniSONE (DELTASONE) 10 MG tablet, Take 4 tablets x 1 day and then decrease by 1/2 tablet per day until down to zero mg., Disp: 18 tablet, Rfl: 0  Observations/Objective: Patient is well-developed, well-nourished in no acute distress.  Resting comfortably at home.  Head is normocephalic, atraumatic.  No labored breathing.  Speech is clear and coherent with logical content.  Patient is alert and oriented at baseline.    Assessment and Plan: 1. Herpes zoster without complication (Primary) - valACYclovir (VALTREX) 1000 MG tablet; Take 1 tablet (1,000  mg total) by mouth 3 (three) times daily for 7 days.  Dispense: 21 tablet; Refill: 0  - Possible shingles prodrome - Valacyclovir started  - Can use heating pad if needed - Isolation protocols discussed if any rash develops over next day or so - Follow up with PCP at scheduled visit - Seek in person evaluation if symptoms worsen   Follow Up Instructions: I discussed the assessment and treatment plan with the patient. The patient was provided an opportunity to ask questions and all were answered. The patient agreed with the plan and demonstrated an understanding of the instructions.  A copy of instructions were sent to the patient via MyChart unless otherwise noted below.    The patient was advised to call back or seek an in-person evaluation if the symptoms worsen or if the condition fails to improve as anticipated.    Margaretann Loveless, PA-C

## 2023-11-27 NOTE — Patient Instructions (Signed)
 Ariel Perez, thank you for joining Ariel Loveless, PA-C for today's virtual visit.  While this provider is not your primary care provider (PCP), if your PCP is located in our provider database this encounter information will be shared with them immediately following your visit.   A [facility] MyChart account gives you access to today's visit and all your visits, tests, and labs performed at [facility] " click here if you don't have a Ariel Perez MyChart account or go to [email or web address]  Consent: (Patient) Ariel Perez provided verbal consent for this virtual visit at the beginning of the encounter.  Current Medications:  Current Outpatient Medications:    valACYclovir (VALTREX) 1000 MG tablet, Take 1 tablet (1,000 mg total) by mouth 3 (three) times daily for 7 days., Disp: 21 tablet, Rfl: 0   albuterol (VENTOLIN HFA) 108 (90 Base) MCG/ACT inhaler, INHALE 1-2 PUFFS INTO THE LUNGS EVERY 6 (SIX) HOURS AS NEEDED FOR WHEEZING OR SHORTNESS OF BREATH., Disp: 6.7 g, Rfl: 4   azelastine (ASTELIN) 0.1 % nasal spray, Place 2 sprays into both nostrils 2 (two) times daily. Use in each nostril as directed, Disp: 30 mL, Rfl: 5   COMIRNATY syringe, , Disp: , Rfl:    fluticasone furoate-vilanterol (BREO ELLIPTA) 200-25 MCG/ACT AEPB, Inhale 1 puff into the lungs daily., Disp: 30 each, Rfl: 5   FLUZONE 0.5 ML injection, , Disp: , Rfl:    hydrochlorothiazide (HYDRODIURIL) 25 MG tablet, Take 1 tablet (25 mg total) by mouth daily., Disp: 90 tablet, Rfl: 3   levonorgestrel (MIRENA, 52 MG,) 20 MCG/24HR IUD, Mirena 20 mcg/24 hours (6 yrs) 52 mg intrauterine device  Take 1 device by intrauterine route., Disp: , Rfl:    montelukast (SINGULAIR) 10 MG tablet, Take 1 tablet (10 mg total) by mouth at bedtime., Disp: 30 tablet, Rfl: 5   predniSONE (DELTASONE) 10 MG tablet, Take 4 tablets x 1 day and then decrease by 1/2 tablet per day until down to zero mg., Disp: 18 tablet, Rfl: 0    Medications ordered in this encounter:  Meds ordered this encounter  Medications   valACYclovir (VALTREX) 1000 MG tablet    Sig: Take 1 tablet (1,000 mg total) by mouth 3 (three) times daily for 7 days.    Dispense:  21 tablet    Refill:  0    Supervising Provider:   Merrilee Jansky [[id number]]     *If you need refills on other medications prior to your next appointment, please contact your pharmacy*  Follow-Up: Call back or seek an in-person evaluation if the symptoms worsen or if the condition fails to improve as anticipated.  [facility] [phone]  Other Instructions  Shingles  Shingles, or herpes zoster, is an infection. It gives you a skin rash and blisters. These infected areas may hurt a lot. Shingles only happens if: You've had chickenpox. You've been given a shot called a vaccine to protect you from getting chickenpox. Shingles is rare in this case. What are the causes? Shingles is caused by a germ called the varicella-zoster virus. This is the same germ that causes chickenpox. After you're exposed to the germ, it stays in your body but is dormant. This means it isn't active. Shingles happens if the germ becomes active again. This can happen years after you're first exposed to the germ. What increases the risk? You may be more likely to get shingles if: You're older than 45 years of age. You're under a  lot of stress. You have a weak immune system. The immune system is your body's defense system. It may be weak if: You have human immunodeficiency virus (HIV). You have acquired immunodeficiency syndrome (AIDS). You have cancer. You take medicines that weaken your immune system. These include organ transplant medicines. What are the signs or symptoms? The first symptoms of shingles may be itching, tingling, or pain. Your skin may feel like it's burning. A few days or weeks later, you'll get a rash. Here's what you can expect: The rash is likely to  be on one side of your body. The rash may be shaped like a belt or a band. Over time, it will turn into blisters filled with fluid. The blisters will break open and change into scabs. The scabs will dry up in about 2-3 weeks. You may also have: A fever. Chills. A headache. Nausea. How is this diagnosed? Shingles is diagnosed with a skin exam. A sample called a culture may be taken from one of your blisters and sent to a lab. This will show if you have shingles. How is this treated? The rash may last for several weeks. There's no cure for shingles, but your health care provider may give you medicines. These medicines may: Help with pain. Help with itching. Help with irritation and swelling. Help you get better sooner. Help to prevent long-term problems. If the rash is on your face, you may need to see an eye doctor or an ear, nose, and throat (ENT) doctor. Follow these instructions at home: Medicines Take your medicines only as told by your provider. Put an anti-itch cream or numbing cream on the rash or blisters as told by your provider. Relieving itching and discomfort  To help with itching: Put cold, wet cloths called cold compresses on the rash or blisters. Take a cool bath. Try adding baking soda or dry oatmeal to the water. Do not bathe in hot water. Use calamine lotion on the rash or blisters. You can get this type of lotion at the store. Blister and rash care Keep your rash covered with a loose bandage. Wear loose clothes that don't rub on your rash. Take care of your rash as told by your provider. Make sure you: Wash your hands with soap and water for at least 20 seconds before and after you change your bandage. If you can't use soap and water, use hand sanitizer. Keep your rash and blisters clean by washing them with mild soap and cool water. Change your bandage. Check your rash every day for signs of infection. Check for: More redness, swelling, or pain. Fluid or  blood. Warmth. Pus or a bad smell. Do not scratch your rash. Do not pick at your blisters. To help you not scratch: Keep your fingernails clean and cut short. Try to wear gloves or mittens when you sleep. General instructions Rest. Wash your hands often with soap and water for at least 20 seconds. If you can't use soap and water, use hand sanitizer. Washing your hands lowers your chance of getting a skin infection. Your infection can cause chickenpox in others. If you have blisters that aren't scabs yet, stay away from: Babies. Pregnant people. Children who have eczema. Older people who have organ transplants. People who have a long-term, or chronic, illness. Anyone who hasn't had chickenpox before. Anyone who hasn't gotten the chickenpox vaccine. How is this prevented? Vaccines are the best way to prevent you from getting chickenpox or shingles. Talk with your provider about  getting these shots. Where to find more information Centers for Disease Control and Prevention (CDC): TonerPromos.no Contact a health care provider if: Your pain doesn't get better with medicine. Your pain doesn't get better after the rash heals. You have any signs of infection around the rash. Your rash or blisters get worse. You have a fever or chills. Get help right away if: The rash is on your face or nose. You have pain in your face or by your eye. You lose feeling on one side of your face. You have trouble seeing. You have ear pain or ringing in your ear. This information is not intended to replace advice given to you by your health care provider. Make sure you discuss any questions you have with your health care provider. Document Revised: 06/11/2023 Document Reviewed: 10/24/2022 Elsevier Patient Education  2024 Elsevier Inc.   If you have been instructed to have an in-person evaluation today at a local Urgent Care facility, please use the link below. It will take you to a list of all of our available [facility], including address, phone number and hours of operation. Please do not delay care.  [facility]  If you or a family member do not have a primary care provider, use the link below to schedule a visit and establish care. When you choose a [facility] primary care physician or advanced practice provider, you gain a long-term partner in health. Find a Primary Care Provider  Learn more about [facility]'s in-office and virtual care options: [facility] - Get Care Now

## 2023-12-05 ENCOUNTER — Other Ambulatory Visit (HOSPITAL_BASED_OUTPATIENT_CLINIC_OR_DEPARTMENT_OTHER): Payer: Self-pay | Admitting: Pulmonary Disease

## 2023-12-09 ENCOUNTER — Ambulatory Visit: Payer: 59 | Admitting: Internal Medicine

## 2023-12-09 VITALS — BP 128/74 | HR 90 | Temp 98.2°F | Resp 16 | Ht 65.0 in | Wt 232.0 lb

## 2023-12-09 DIAGNOSIS — R739 Hyperglycemia, unspecified: Secondary | ICD-10-CM

## 2023-12-09 DIAGNOSIS — R911 Solitary pulmonary nodule: Secondary | ICD-10-CM

## 2023-12-09 DIAGNOSIS — I10 Essential (primary) hypertension: Secondary | ICD-10-CM

## 2023-12-09 DIAGNOSIS — J45909 Unspecified asthma, uncomplicated: Secondary | ICD-10-CM | POA: Diagnosis not present

## 2023-12-09 DIAGNOSIS — B029 Zoster without complications: Secondary | ICD-10-CM

## 2023-12-09 DIAGNOSIS — F439 Reaction to severe stress, unspecified: Secondary | ICD-10-CM

## 2023-12-09 LAB — BASIC METABOLIC PANEL
BUN: 11 mg/dL (ref 6–23)
CO2: 30 meq/L (ref 19–32)
Calcium: 10.1 mg/dL (ref 8.4–10.5)
Chloride: 98 meq/L (ref 96–112)
Creatinine, Ser: 1 mg/dL (ref 0.40–1.20)
GFR: 68.49 mL/min (ref >60.00)
Glucose, Bld: 111 mg/dL — ABNORMAL HIGH (ref 70–99)
Potassium: 4.2 meq/L (ref 3.5–5.1)
Sodium: 136 meq/L (ref 135–145)

## 2023-12-09 LAB — LIPID PANEL
Cholesterol: 157 mg/dL (ref 0–200)
HDL: 49.4 mg/dL (ref >39.00)
LDL Cholesterol: 94 mg/dL (ref 0–99)
NonHDL: 107.5
Total CHOL/HDL Ratio: 3
Triglycerides: 69 mg/dL (ref 0.0–149.0)
VLDL: 13.8 mg/dL (ref 0.0–40.0)

## 2023-12-09 LAB — HEPATIC FUNCTION PANEL
ALT: 11 U/L (ref 0–35)
AST: 14 U/L (ref 0–37)
Albumin: 4.7 g/dL (ref 3.5–5.2)
Alkaline Phosphatase: 71 U/L (ref 39–117)
Bilirubin, Direct: 0.1 mg/dL (ref 0.0–0.3)
Total Bilirubin: 0.7 mg/dL (ref 0.2–1.2)
Total Protein: 8.1 g/dL (ref 6.0–8.3)

## 2023-12-09 LAB — HEMOGLOBIN A1C: Hgb A1c MFr Bld: 6.2 % (ref 4.6–6.5)

## 2023-12-09 NOTE — Progress Notes (Signed)
 Subjective:    Patient ID: Ariel Perez, female    DOB: 04/18/1981, 43 y.o.   MRN: [id number]  Patient here for  Chief Complaint  Patient presents with   Medical Management of Chronic Issues    HPI Here for a scheduled follow up - Follow up regarding hypertension, hyperglycemia and asthma. Had f/u with Dr Vassie Loll 10/27/23 - changed to breo. Continue singulair, flonase and astelin. She changed her zyrtec to claritin D. I discussed f/u chest CT. She plans to d/w Dr Vassie Loll next visit. Breathing doing well. No wheezing. No increased cough or congestion. Above regimen controlling allergy symptoms. Also evaluated 11/27/23 - diagnosed with shingles. Took antiviral. Never developed a rash. Pain resolved. No residual problems or symptoms.  Bowels stable. Due to see gyn next week. (Dr Jearld Lesch [place])   Past Medical History:  Diagnosis Date   Asthma    Fibroid    Headache    Hypertension    Migraines    Osteoarthritis    Past Surgical History:  Procedure Laterality Date   IR RADIOLOGIST EVAL & MGMT  01/13/2023   WISDOM TOOTH EXTRACTION N/A 2008   Approx x4    Family History  Problem Relation Age of Onset   Cancer Father        unknown   Hypertension Maternal Grandmother    Rheum arthritis Maternal Grandmother    Cancer Maternal Grandfather        unknown   Diabetes Paternal Grandmother    Breast cancer Neg Hx    Ovarian cancer Neg Hx    Uterine cancer Neg Hx    Prostate cancer Neg Hx    Colon cancer Neg Hx    Pancreatic cancer Neg Hx    Social History   Socioeconomic History   Marital status: Single    Spouse name: Not on file   Number of children: 0   Years of education: Not on file   Highest education level: Bachelor's degree (e.g., BA, AB, BS)  Occupational History   Occupation: Adult nurse primary care  Tobacco Use   Smoking status: Never   Smokeless tobacco: Never  Vaping Use   Vaping status: Never Used  Substance and Sexual Activity   Alcohol use: Not Currently    Drug use: No   Sexual activity: Yes    Partners: Male    Birth control/protection: I.U.D.    Comment: 1 partner  Other Topics Concern   Not on file  Social History Narrative   Works here at [employer] by herself    2 dogs live inside    Caffeine- Occasional    Right handed    Enjoys sleeping    Social Drivers of Health   Financial Resource Strain: Low Risk  (12/07/2023)   Overall Financial Resource Strain (CARDIA)    Difficulty of Paying Living Expenses: Not hard at all  Food Insecurity: No Food Insecurity (12/07/2023)   Hunger Vital Sign    Worried About Running Out of Food in the Last Year: Never true    Ran Out of Food in the Last Year: Never true  Transportation Needs: No Transportation Needs (12/07/2023)   PRAPARE - Administrator, Civil Service (Medical): No    Lack of Transportation (Non-Medical): No  Physical Activity: Insufficiently Active (12/07/2023)   Exercise Vital Sign    Days of Exercise per Week: 3 days    Minutes of Exercise per Session: 30 min  Stress: No Stress Concern  Present (12/07/2023)   [employer] of Occupational Health - Occupational Stress Questionnaire    Feeling of Stress : Not at all  Social Connections: Unknown (12/07/2023)   Social Connection and Isolation Panel [NHANES]    Frequency of Communication with Friends and Family: More than three times a week    Frequency of Social Gatherings with Friends and Family: Twice a week    Attends Religious Services: More than 4 times per year    Active Member of [employer] or Organizations: Patient declined    Attends Engineer, structural: Not on file    Marital Status: Never married     Review of Systems  Constitutional:  Negative for appetite change and unexpected weight change.  HENT:  Negative for congestion and sinus pressure.   Respiratory:  Negative for cough, chest tightness and shortness of breath.   Cardiovascular:  Negative for chest pain, palpitations and leg  swelling.  Gastrointestinal:  Negative for abdominal pain, diarrhea, nausea and vomiting.  Genitourinary:  Negative for difficulty urinating and dysuria.  Musculoskeletal:  Negative for joint swelling and myalgias.  Skin:  Negative for color change and rash.  Neurological:  Negative for dizziness and headaches.  Psychiatric/Behavioral:  Negative for agitation and dysphoric mood.        Objective:     BP 128/74   Pulse 90   Temp 98.2 F (36.8 C)   Resp 16   Ht 5\' 5"  (1.651 m)   Wt 232 lb (105.2 kg)   SpO2 98%   BMI 38.61 kg/m  Wt Readings from Last 3 Encounters:  12/09/23 232 lb (105.2 kg)  10/27/23 233 lb 4.8 oz (105.8 kg)  09/03/23 239 lb (108.4 kg)    Physical Exam Vitals reviewed.  Constitutional:      General: She is not in acute distress.    Appearance: Normal appearance.  HENT:     Head: Normocephalic and atraumatic.     Right Ear: External ear normal.     Left Ear: External ear normal.     Mouth/Throat:     Pharynx: No oropharyngeal exudate or posterior oropharyngeal erythema.  Eyes:     General: No scleral icterus.       Right eye: No discharge.        Left eye: No discharge.     Conjunctiva/sclera: Conjunctivae normal.  Neck:     Thyroid: No thyromegaly.  Cardiovascular:     Rate and Rhythm: Normal rate and regular rhythm.  Pulmonary:     Effort: No respiratory distress.     Breath sounds: Normal breath sounds. No wheezing.  Abdominal:     General: Bowel sounds are normal.     Palpations: Abdomen is soft.     Tenderness: There is no abdominal tenderness.  Musculoskeletal:        General: No swelling or tenderness.     Cervical back: Neck supple. No tenderness.  Lymphadenopathy:     Cervical: No cervical adenopathy.  Skin:    Findings: No erythema or rash.  Neurological:     Mental Status: She is alert.  Psychiatric:        Mood and Affect: Mood normal.        Behavior: Behavior normal.         Outpatient Encounter Medications as of  12/09/2023  Medication Sig   albuterol (VENTOLIN HFA) 108 (90 Base) MCG/ACT inhaler INHALE 1-2 PUFFS INTO THE LUNGS EVERY 6 (SIX) HOURS AS NEEDED FOR WHEEZING OR SHORTNESS  OF BREATH.   azelastine (ASTELIN) 0.1 % nasal spray Place 2 sprays into both nostrils 2 (two) times daily. Use in each nostril as directed   fluticasone furoate-vilanterol (BREO ELLIPTA) 200-25 MCG/ACT AEPB Inhale 1 puff into the lungs daily.   hydrochlorothiazide (HYDRODIURIL) 25 MG tablet Take 1 tablet (25 mg total) by mouth daily.   levonorgestrel (MIRENA, 52 MG,) 20 MCG/24HR IUD Mirena 20 mcg/24 hours (6 yrs) 52 mg intrauterine device  Take 1 device by intrauterine route.   montelukast (SINGULAIR) 10 MG tablet TAKE 1 TABLET AT BEDTIME   [DISCONTINUED] COMIRNATY syringe    [DISCONTINUED] FLUZONE 0.5 ML injection    [DISCONTINUED] predniSONE (DELTASONE) 10 MG tablet Take 4 tablets x 1 day and then decrease by 1/2 tablet per day until down to zero mg.   No facility-administered encounter medications on file as of 12/09/2023.     Lab Results  Component Value Date   WBC 5.0 04/17/2023   HGB 14.1 04/17/2023   HCT 43.6 04/17/2023   PLT 243.0 04/17/2023   GLUCOSE 111 (H) 12/09/2023   CHOL 157 12/09/2023   TRIG 69.0 12/09/2023   HDL 49.40 12/09/2023   LDLCALC 94 12/09/2023   ALT 11 12/09/2023   AST 14 12/09/2023   NA 136 12/09/2023   K 4.2 12/09/2023   CL 98 12/09/2023   CREATININE 1.00 12/09/2023   BUN 11 12/09/2023   CO2 30 12/09/2023   TSH 1.21 04/17/2023   HGBA1C 6.2 12/09/2023    MR Pelvis W Wo Contrast Result Date: 06/01/2023 CLINICAL DATA:  Fibroid surveillance EXAM: MRI PELVIS WITHOUT AND WITH CONTRAST TECHNIQUE: Multiplanar multisequence MR imaging of the pelvis was performed both before and after administration of intravenous contrast. CONTRAST:  10mL GADAVIST GADOBUTROL 1 MMOL/ML IV SOLN COMPARISON:  01/10/2023 FINDINGS: Urinary Tract: Bladder is compressed by adjacent fibroid bulk but otherwise  unremarkable. Bowel:  Unremarkable visualized pelvic bowel loops. Vascular/Lymphatic: No pathologically enlarged lymph nodes. No significant vascular abnormality seen. Reproductive: Bulky fibroid uterus, extending well into the lower abdomen, overall dimensions at least 19.6 by 11.0 x 11.4 cm, not significantly changed compared to prior examination (series 6, image 20, series 3, image 18). Largest fibroid is again a submucosal fibroid of the posterior uterine body and fundus measuring 9.6 x 9.1 x 8.7 cm, this fibroid with heterogeneous internal hypoenhancement suggesting partial degeneration, and there is an additional large, exophytic fibroid arising from the fundus measuring 9.1 x 8.8 x 7.0 cm. The endometrial cavity is effaced by submucosal fibroids. Small, benign bilateral ovarian cysts and follicles, requiring no further follow-up or characterization. Other:  None. Musculoskeletal: No suspicious bone lesions identified. IMPRESSION: 1. Bulky fibroid uterus, extending well into the lower abdomen, overall dimensions at least 19.6 by 11.0 x 11.4 cm, not significantly changed compared to prior examination. 2. Largest fibroid is again a submucosal fibroid of the posterior uterine body and fundus measuring 9.6 x 9.1 x 8.7 cm, not significantly changed, this fibroid with heterogeneous internal hypoenhancement suggesting partial degeneration, and there is an additional large, exophytic fibroid arising from the fundus measuring 9.1 x 8.8 x 7.0 cm. Electronically Signed   By: Jearld Lesch M.D.   On: 06/01/2023 12:53       Assessment & Plan:  Essential hypertension -     Basic metabolic panel with GFR -     Hepatic function panel -     Lipid panel  Hyperglycemia Assessment & Plan: Low carb diet and exercise. Will follow met b and  a1c.   Lab Results  Component Value Date   HGBA1C 6.2 12/09/2023    Orders: -     Hemoglobin A1c  Mild asthma without complication, unspecified whether persistent Assessment  & Plan: Had f/u with Dr Vassie Loll 10/27/23 - changed to breo. Continue singulair, flonase and astelin. She changed her zyrtec to claritin D. I discussed f/u chest CT. She plans to d/w Dr Vassie Loll next visit. Breathing doing well. No wheezing. No increased cough or congestion.    Benign essential HTN Assessment & Plan: Blood pressure as outlined. Continue hydrochlorothiazide. Follow pressures. Follow metabolic panel.    Lung nodule Assessment & Plan: Continues f/u with Dr Vassie Loll. She plans to discuss with Dr Vassie Loll. Discussed today.    Stress Assessment & Plan: Overall appears to be handling things relatively well. Follow.    Herpes zoster without complication Assessment & Plan: Recent diagnosis. Resolved. No residual problems now.       Dale Oso, MD

## 2023-12-11 ENCOUNTER — Encounter: Payer: Self-pay | Admitting: Internal Medicine

## 2023-12-14 LAB — HM MAMMOGRAPHY

## 2023-12-17 ENCOUNTER — Encounter: Payer: Self-pay | Admitting: Internal Medicine

## 2023-12-17 DIAGNOSIS — B029 Zoster without complications: Secondary | ICD-10-CM | POA: Insufficient documentation

## 2023-12-17 NOTE — Assessment & Plan Note (Signed)
 Continues f/u with Dr Vassie Loll. She plans to discuss with Dr Vassie Loll. Discussed today.

## 2023-12-17 NOTE — Assessment & Plan Note (Signed)
 Had f/u with Dr Vassie Loll 10/27/23 - changed to breo. Continue singulair, flonase and astelin. She changed her zyrtec to claritin D. I discussed f/u chest CT. She plans to d/w Dr Vassie Loll next visit. Breathing doing well. No wheezing. No increased cough or congestion.

## 2023-12-17 NOTE — Assessment & Plan Note (Signed)
 Recent diagnosis. Resolved. No residual problems now.

## 2023-12-17 NOTE — Assessment & Plan Note (Signed)
Overall appears to be handling things relatively well.  Follow.   

## 2023-12-17 NOTE — Assessment & Plan Note (Signed)
 Low carb diet and exercise. Will follow met b and a1c.   Lab Results  Component Value Date   HGBA1C 6.2 12/09/2023

## 2023-12-17 NOTE — Assessment & Plan Note (Signed)
 Blood pressure as outlined. Continue hydrochlorothiazide. Follow pressures. Follow metabolic panel.

## 2024-01-01 ENCOUNTER — Other Ambulatory Visit: Payer: Self-pay | Admitting: Internal Medicine

## 2024-04-11 ENCOUNTER — Ambulatory Visit: Admitting: Internal Medicine

## 2024-04-11 VITALS — BP 126/74 | HR 84 | Resp 16 | Ht 65.5 in | Wt 231.0 lb

## 2024-04-11 DIAGNOSIS — J45909 Unspecified asthma, uncomplicated: Secondary | ICD-10-CM

## 2024-04-11 DIAGNOSIS — R739 Hyperglycemia, unspecified: Secondary | ICD-10-CM

## 2024-04-11 DIAGNOSIS — R5383 Other fatigue: Secondary | ICD-10-CM | POA: Diagnosis not present

## 2024-04-11 DIAGNOSIS — F439 Reaction to severe stress, unspecified: Secondary | ICD-10-CM

## 2024-04-11 DIAGNOSIS — D219 Benign neoplasm of connective and other soft tissue, unspecified: Secondary | ICD-10-CM | POA: Diagnosis not present

## 2024-04-11 DIAGNOSIS — N926 Irregular menstruation, unspecified: Secondary | ICD-10-CM

## 2024-04-11 DIAGNOSIS — R911 Solitary pulmonary nodule: Secondary | ICD-10-CM

## 2024-04-11 DIAGNOSIS — I10 Essential (primary) hypertension: Secondary | ICD-10-CM

## 2024-04-11 LAB — HEPATIC FUNCTION PANEL
ALT: 10 U/L (ref 0–35)
AST: 14 U/L (ref 0–37)
Albumin: 4.4 g/dL (ref 3.5–5.2)
Alkaline Phosphatase: 65 U/L (ref 39–117)
Bilirubin, Direct: 0.1 mg/dL (ref 0.0–0.3)
Total Bilirubin: 0.6 mg/dL (ref 0.2–1.2)
Total Protein: 7.3 g/dL (ref 6.0–8.3)

## 2024-04-11 LAB — BASIC METABOLIC PANEL WITH GFR
BUN: 12 mg/dL (ref 6–23)
CO2: 29 meq/L (ref 19–32)
Calcium: 9.4 mg/dL (ref 8.4–10.5)
Chloride: 102 meq/L (ref 96–112)
Creatinine, Ser: 0.92 mg/dL (ref 0.40–1.20)
GFR: 75.52 mL/min (ref >60.00)
Glucose, Bld: 93 mg/dL (ref 70–99)
Potassium: 4.5 meq/L (ref 3.5–5.1)
Sodium: 138 meq/L (ref 135–145)

## 2024-04-11 LAB — CBC WITH DIFFERENTIAL/PLATELET
Basophils Absolute: 0 K/uL (ref 0.0–0.1)
Basophils Relative: 0.2 % (ref 0.0–3.0)
Eosinophils Absolute: 0.4 K/uL (ref 0.0–0.7)
Eosinophils Relative: 6.8 % — ABNORMAL HIGH (ref 0.0–5.0)
HCT: 42.2 % (ref 36.0–46.0)
Hemoglobin: 13.8 g/dL (ref 12.0–15.0)
Lymphocytes Relative: 33.3 % (ref 12.0–46.0)
Lymphs Abs: 1.9 K/uL (ref 0.7–4.0)
MCHC: 32.6 g/dL (ref 30.0–36.0)
MCV: 89.7 fl (ref 78.0–100.0)
Monocytes Absolute: 0.3 K/uL (ref 0.1–1.0)
Monocytes Relative: 5 % (ref 3.0–12.0)
Neutro Abs: 3.2 K/uL (ref 1.4–7.7)
Neutrophils Relative %: 54.7 % (ref 43.0–77.0)
Platelets: 245 K/uL (ref 150.0–400.0)
RBC: 4.71 Mil/uL (ref 3.87–5.11)
RDW: 14.6 % (ref 11.5–15.5)
WBC: 5.8 K/uL (ref 4.0–10.5)

## 2024-04-11 LAB — TSH: TSH: 1.5 u[IU]/mL (ref 0.35–5.50)

## 2024-04-11 LAB — LIPID PANEL
Cholesterol: 143 mg/dL (ref 0–200)
HDL: 52.1 mg/dL (ref >39.00)
LDL Cholesterol: 80 mg/dL (ref 0–99)
NonHDL: 90.68
Total CHOL/HDL Ratio: 3
Triglycerides: 51 mg/dL (ref 0.0–149.0)
VLDL: 10.2 mg/dL (ref 0.0–40.0)

## 2024-04-11 LAB — HEMOGLOBIN A1C: Hgb A1c MFr Bld: 6 % (ref 4.6–6.5)

## 2024-04-11 LAB — FOLLICLE STIMULATING HORMONE: FSH: 10.9 m[IU]/mL

## 2024-04-11 NOTE — Progress Notes (Unsigned)
 Subjective:    Patient ID: Ariel Perez, female    DOB: July 06, 1981, 43 y.o.   MRN: [id number]  Patient here for  Chief Complaint  Patient presents with   Medical Management of Chronic Issues    HPI Here for a scheduled follow up - follow up regarding hypertension, hyperglycemia and asthma. Continues hydrochlorothiazide . Had f/u with Dr Jude 10/27/23 - changed to breo. Not taking singulair . Uses flonase prn. Breathing stable. Had chest CT 02/2023 - 4.7x 5.14mm solid noncalcified left lung lower lobe pulmonary nodule. Discussed need for f/u with her today. She plans to call and schedule. sees gyn - just saw Dr Limmie. Per note - last check 11/2023.  Need records. MRI 05/2023 - bulky fibroid uterus - see report. Last mammogram report I have - 12/18/22 - birads I. Reports had mammogram through gyn. Will obtain results. Some increased fatigue. Would like cortisol level checked. Having periods. Has not had period this month yet. No abdominal pain or bowel change.    Past Medical History:  Diagnosis Date   Asthma    Fibroid    Headache    Hypertension    Migraines    Osteoarthritis    Past Surgical History:  Procedure Laterality Date   IR RADIOLOGIST EVAL & MGMT  01/13/2023   WISDOM TOOTH EXTRACTION N/A 2008   Approx x4    Family History  Problem Relation Age of Onset   Cancer Father        unknown   Hypertension Maternal Grandmother    Rheum arthritis Maternal Grandmother    Cancer Maternal Grandfather        unknown   Diabetes Paternal Grandmother    Breast cancer Neg Hx    Ovarian cancer Neg Hx    Uterine cancer Neg Hx    Prostate cancer Neg Hx    Colon cancer Neg Hx    Pancreatic cancer Neg Hx    Social History   Socioeconomic History   Marital status: Single    Spouse name: Not on file   Number of children: 0   Years of education: Not on file   Highest education level: Bachelor's degree (e.g., BA, AB, BS)  Occupational History   Occupation: Adult nurse primary care   Tobacco Use   Smoking status: Never   Smokeless tobacco: Never  Vaping Use   Vaping status: Never Used  Substance and Sexual Activity   Alcohol use: Not Currently   Drug use: No   Sexual activity: Yes    Partners: Male    Birth control/protection: I.U.D.    Comment: 1 partner  Other Topics Concern   Not on file  Social History Narrative   Works here at [employer] by herself    2 dogs live inside    Caffeine- Occasional    Right handed    Enjoys sleeping    Social Drivers of Health   Financial Resource Strain: Low Risk  (04/11/2024)   Overall Financial Resource Strain (CARDIA)    Difficulty of Paying Living Expenses: Not hard at all  Food Insecurity: No Food Insecurity (04/11/2024)   Hunger Vital Sign    Worried About Running Out of Food in the Last Year: Never true    Ran Out of Food in the Last Year: Never true  Transportation Needs: No Transportation Needs (04/11/2024)   PRAPARE - Administrator, Civil Service (Medical): No    Lack of Transportation (Non-Medical): No  Physical Activity: Insufficiently  Active (04/11/2024)   Exercise Vital Sign    Days of Exercise per Week: 2 days    Minutes of Exercise per Session: 30 min  Stress: No Stress Concern Present (04/11/2024)   [employer] of Occupational Health - Occupational Stress Questionnaire    Feeling of Stress: Not at all  Social Connections: Moderately Isolated (04/11/2024)   Social Connection and Isolation Panel    Frequency of Communication with Friends and Family: More than three times a week    Frequency of Social Gatherings with Friends and Family: Once a week    Attends Religious Services: More than 4 times per year    Active Member of [employer] or Organizations: No    Attends Engineer, structural: Not on file    Marital Status: Never married     Review of Systems     Objective:     BP 126/74   Pulse 84   Resp 16   Ht 5' 5.5 (1.664 m)   Wt 231 lb (104.8 kg)   SpO2 98%    BMI 37.86 kg/m  Wt Readings from Last 3 Encounters:  04/11/24 231 lb (104.8 kg)  12/09/23 232 lb (105.2 kg)  10/27/23 233 lb 4.8 oz (105.8 kg)    Physical Exam  {Perform Simple Foot Exam  Perform Detailed exam:1} {Insert foot Exam (Optional):30965}   Outpatient Encounter Medications as of 04/11/2024  Medication Sig   albuterol  (VENTOLIN  HFA) 108 (90 Base) MCG/ACT inhaler INHALE 1-2 PUFFS INTO THE LUNGS EVERY 6 (SIX) HOURS AS NEEDED FOR WHEEZING OR SHORTNESS OF BREATH.   azelastine  (ASTELIN ) 0.1 % nasal spray Place 2 sprays into both nostrils 2 (two) times daily. Use in each nostril as directed   fluticasone  furoate-vilanterol (BREO ELLIPTA ) 200-25 MCG/ACT AEPB Inhale 1 puff into the lungs daily.   hydrochlorothiazide  (HYDRODIURIL ) 25 MG tablet TAKE 1 TABLET (25 MG TOTAL) BY MOUTH DAILY.   levonorgestrel  (MIRENA , 52 MG,) 20 MCG/24HR IUD Mirena  20 mcg/24 hours (6 yrs) 52 mg intrauterine device  Take 1 device by intrauterine route.   montelukast  (SINGULAIR ) 10 MG tablet TAKE 1 TABLET AT BEDTIME   No facility-administered encounter medications on file as of 04/11/2024.     Lab Results  Component Value Date   WBC 5.0 04/17/2023   HGB 14.1 04/17/2023   HCT 43.6 04/17/2023   PLT 243.0 04/17/2023   GLUCOSE 111 (H) 12/09/2023   CHOL 157 12/09/2023   TRIG 69.0 12/09/2023   HDL 49.40 12/09/2023   LDLCALC 94 12/09/2023   ALT 11 12/09/2023   AST 14 12/09/2023   NA 136 12/09/2023   K 4.2 12/09/2023   CL 98 12/09/2023   CREATININE 1.00 12/09/2023   BUN 11 12/09/2023   CO2 30 12/09/2023   TSH 1.21 04/17/2023   HGBA1C 6.2 12/09/2023    MR Pelvis W Wo Contrast Result Date: 06/01/2023 CLINICAL DATA:  Fibroid surveillance EXAM: MRI PELVIS WITHOUT AND WITH CONTRAST TECHNIQUE: Multiplanar multisequence MR imaging of the pelvis was performed both before and after administration of intravenous contrast. CONTRAST:  10mL GADAVIST  GADOBUTROL  1 MMOL/ML IV SOLN COMPARISON:  01/10/2023 FINDINGS:  Urinary Tract: Bladder is compressed by adjacent fibroid bulk but otherwise unremarkable. Bowel:  Unremarkable visualized pelvic bowel loops. Vascular/Lymphatic: No pathologically enlarged lymph nodes. No significant vascular abnormality seen. Reproductive: Bulky fibroid uterus, extending well into the lower abdomen, overall dimensions at least 19.6 by 11.0 x 11.4 cm, not significantly changed compared to prior examination (series 6, image 20, series 3,  image 18). Largest fibroid is again a submucosal fibroid of the posterior uterine body and fundus measuring 9.6 x 9.1 x 8.7 cm, this fibroid with heterogeneous internal hypoenhancement suggesting partial degeneration, and there is an additional large, exophytic fibroid arising from the fundus measuring 9.1 x 8.8 x 7.0 cm. The endometrial cavity is effaced by submucosal fibroids. Small, benign bilateral ovarian cysts and follicles, requiring no further follow-up or characterization. Other:  None. Musculoskeletal: No suspicious bone lesions identified. IMPRESSION: 1. Bulky fibroid uterus, extending well into the lower abdomen, overall dimensions at least 19.6 by 11.0 x 11.4 cm, not significantly changed compared to prior examination. 2. Largest fibroid is again a submucosal fibroid of the posterior uterine body and fundus measuring 9.6 x 9.1 x 8.7 cm, not significantly changed, this fibroid with heterogeneous internal hypoenhancement suggesting partial degeneration, and there is an additional large, exophytic fibroid arising from the fundus measuring 9.1 x 8.8 x 7.0 cm. Electronically Signed   By: Marolyn JONETTA Jaksch M.D.   On: 06/01/2023 12:53       Assessment & Plan:  Other fatigue -     Cortisol-am, blood  Hyperglycemia -     Hemoglobin A1c  Essential hypertension -     Lipid panel -     Hepatic function panel -     Basic metabolic panel with GFR -     TSH -     CBC with Differential/Platelet  Fibroids -     CBC with Differential/Platelet  Menstrual  changes -     Follicle stimulating hormone     Allena Hamilton, MD

## 2024-04-12 ENCOUNTER — Ambulatory Visit: Payer: Self-pay | Admitting: Internal Medicine

## 2024-04-12 ENCOUNTER — Encounter: Payer: Self-pay | Admitting: Internal Medicine

## 2024-04-12 LAB — CORTISOL-AM, BLOOD: Cortisol - AM: 7.1 ug/dL

## 2024-04-12 NOTE — Assessment & Plan Note (Signed)
 Appears to be handling things relatively well.  Follow.

## 2024-04-12 NOTE — Assessment & Plan Note (Signed)
 Sees gyn - just saw Dr Limmie. Per note - last check 11/2023.  Need records. MRI 05/2023 - bulky fibroid uterus - see report.

## 2024-04-12 NOTE — Assessment & Plan Note (Signed)
 Had f/u with Dr Jude 10/27/23 - changed to breo. Not taking singulair . Uses flonase prn. Breathing stable. Had chest CT 02/2023 - 4.7x 5.27mm solid noncalcified left lung lower lobe pulmonary nodule. Discussed need for f/u with her today. She plans to call and schedule. Breathing stable.

## 2024-04-12 NOTE — Assessment & Plan Note (Addendum)
 Low carb diet and exercise. Follow met b and A1c.  Lab Results  Component Value Date   HGBA1C 6.0 04/11/2024

## 2024-04-12 NOTE — Assessment & Plan Note (Signed)
 Continues f/u with Dr Jude. She plans to discuss with Dr Jude. Discussed with he today.

## 2024-04-12 NOTE — Assessment & Plan Note (Signed)
 Blood pressure as outlined. Continue hydrochlorothiazide . Have her spot check her pressures. No change in medi cation today. Check metabolic panel.

## 2024-06-17 ENCOUNTER — Encounter (HOSPITAL_BASED_OUTPATIENT_CLINIC_OR_DEPARTMENT_OTHER): Payer: Self-pay | Admitting: Pulmonary Disease

## 2024-06-17 ENCOUNTER — Ambulatory Visit (HOSPITAL_BASED_OUTPATIENT_CLINIC_OR_DEPARTMENT_OTHER): Admitting: Pulmonary Disease

## 2024-06-17 VITALS — BP 118/78 | HR 88 | Wt 229.0 lb

## 2024-06-17 DIAGNOSIS — Z23 Encounter for immunization: Secondary | ICD-10-CM

## 2024-06-17 DIAGNOSIS — R911 Solitary pulmonary nodule: Secondary | ICD-10-CM

## 2024-06-17 DIAGNOSIS — J4540 Moderate persistent asthma, uncomplicated: Secondary | ICD-10-CM

## 2024-06-17 NOTE — Progress Notes (Signed)
 Subjective:    Patient ID: Ariel Perez, female    DOB: [birth year], 43 y.o.   MRN: [id number]     43 yo never smoker, for FU of adult onset asthma , since 2022 & incidental pulm nodule She was referred to pulmonary clinic in [facility] 05/2021 for new onset wheezing since November 2021.  She has a history of recurrent sinus infections which generally require an antibiotic and prednisone  course to improve perennial allergies for which she takes Zyrtec and Flonase   She was with [employer] now with [employer] -she moved to an older home built in [address] 3 years ago, she has 2 dogs -age 35 years   Initial OV 02/2022 >>intensify nasal regimen by adding azelastine  to Zyrtec and Flonase, regimen of Symbicort  as needed 05/2022 step up to Symbicort  160 10/2023 Switch to Breo 200    Discussed the use of AI scribe software for clinical note transcription with the patient, who gave verbal consent to proceed.  History of Present Illness Ariel Perez is a 43 year old female with moderate persistent asthma who presents for follow-up.  Her asthma remains stable, likely due to increased physical activity from walking her puppy. She uses Breo daily but missed a dose this morning. She experiences nasal stuffiness and uses Astelin  nasal spray for ear drainage, particularly in the right ear, which produces a high-pitched noise when she blows her nose. She is also taking Singulair .  A CT scan from Ariel 24 of the previous year showed a noncalcified nodule in the left lower lobe. She has no smoking history and no known family history of lung cancer.     Significant tests/ events reviewed  CT chest wo con 03/2023 >>  (for uterine fibroids) 4.7 x 5.4 mm noncalcified nodule in the left lung lower lobe    05/2022 FENO 57 Spirometry 02/2022 no airway obstruction, ratio 85, FEV1 99% 06/07/2021 >  Eos 700 /  IgE  63 06/2022 eos 300              RAST neg IgE 69   11/2015 HST >>neg  Review of  Systems  neg for any significant sore throat, dysphagia, itching, sneezing, nasal congestion or excess/ purulent secretions, fever, chills, sweats, unintended wt loss, pleuritic or exertional cp, hempoptysis, orthopnea pnd or change in chronic leg swelling. Also denies presyncope, palpitations, heartburn, abdominal pain, nausea, vomiting, diarrhea or change in bowel or urinary habits, dysuria,hematuria, rash, arthralgias, visual complaints, headache, numbness weakness or ataxia.      Objective:   Physical Exam  Gen. Pleasant, obese, in no distress ENT - no lesions, no post nasal drip Neck: No JVD, no thyromegaly, no carotid bruits Lungs: no use of accessory muscles, no dullness to percussion, decreased without rales or rhonchi  Cardiovascular: Rhythm regular, heart sounds  normal, no murmurs or gallops, no peripheral edema Musculoskeletal: No deformities, no cyanosis or clubbing , no tremors       Assessment & Plan:   Assessment and Plan Assessment & Plan Moderate persistent asthma Moderate persistent asthma is well-controlled with Breo. She adheres to the medication regimen, facilitated by placing the inhaler near the refrigerator. Occasional nasal congestion is managed with Astelin  nasal spray. A high-pitched noise in her right ear when blowing her nose may be due to drainage issues. Singulair  is recommended during peak allergy  seasons to enhance asthma management. She missed a dose of Breo this morning but generally adheres to the regimen. Consideration for reducing  the Breo dose will be made if asthma control remains stable over the next few months. - Continue Breo daily. - Use Astelin  nasal spray during October, November, March, and April. - Continue Singulair  during October, November, March, and April. - Consider reducing Breo dose if asthma remains well-controlled over the next few months.  Incidental pulmonary nodule, left lower lobe An incidental noncalcified pulmonary nodule  in the left lower lobe was identified on a CT scan in Ariel 2024. She is low risk for malignancy, with no smoking history or family history of lung cancer. The nodule was initially discovered during staging for fibroids. A follow-up CT scan is planned as the previous appointment was missed due to a job change. She prefers a non-hospital facility for cost reasons. - Order follow-up CT scan of the chest within the next month at a non-hospital facility, , without contrast.

## 2024-06-17 NOTE — Patient Instructions (Addendum)
 X Ct chest wo con     VISIT SUMMARY: During your follow-up appointment, we discussed your moderate persistent asthma and an incidental pulmonary nodule found in your left lower lung lobe. Your asthma remains stable, likely due to increased physical activity from walking your puppy. We also reviewed your current medications and planned a follow-up CT scan for the pulmonary nodule.  YOUR PLAN: -MODERATE PERSISTENT ASTHMA: Moderate persistent asthma is a condition where the airways in your lungs are inflamed and narrowed, causing difficulty in breathing. Your asthma is well-controlled with your current medication, Breo, which you should continue to take daily. You should also use Astelin  nasal spray and Singulair  during the peak allergy  seasons of October, November, March, and April. If your asthma remains stable over the next few months, we may consider reducing your Breo dose.  -INCIDENTAL PULMONARY NODULE, LEFT LOWER LOBE: An incidental pulmonary nodule is a small, abnormal growth in the lung that was found by chance. Your nodule is noncalcified and located in the left lower lobe of your lung. Given your low risk for lung cancer, we will monitor this nodule with a follow-up CT scan within the next month at a non-hospital facility, without contrast.  INSTRUCTIONS: Please schedule a follow-up CT scan of your chest within the next month at a non-hospital facility,  without contrast. Continue taking your medications as prescribed and follow the seasonal recommendations for Astelin  nasal spray and Singulair . If your asthma remains stable, we may consider reducing your Breo dose in the future.                      Contains text generated by Abridge.                                 Contains text generated by Abridge.

## 2024-06-17 NOTE — Addendum Note (Signed)
 Addended by: ALINDA WINN KIDD on: 06/17/2024 12:05 PM   Modules accepted: Orders

## 2024-07-11 ENCOUNTER — Ambulatory Visit
Admission: RE | Admit: 2024-07-11 | Discharge: 2024-07-11 | Disposition: A | Discharge status: Home / Self Care | Source: Ambulatory Visit | Attending: Pulmonary Disease | Admitting: Pulmonary Disease

## 2024-07-11 DIAGNOSIS — R911 Solitary pulmonary nodule: Secondary | ICD-10-CM

## 2024-07-14 ENCOUNTER — Ambulatory Visit: Payer: Self-pay | Admitting: Pulmonary Disease

## 2024-08-12 ENCOUNTER — Ambulatory Visit: Admitting: Internal Medicine

## 2024-08-12 ENCOUNTER — Encounter: Payer: Self-pay | Admitting: Internal Medicine

## 2024-08-12 VITALS — BP 130/84 | HR 84 | Temp 98.4°F | Ht 65.5 in | Wt 229.2 lb

## 2024-08-12 DIAGNOSIS — Z1231 Encounter for screening mammogram for malignant neoplasm of breast: Secondary | ICD-10-CM

## 2024-08-12 DIAGNOSIS — F439 Reaction to severe stress, unspecified: Secondary | ICD-10-CM

## 2024-08-12 DIAGNOSIS — D219 Benign neoplasm of connective and other soft tissue, unspecified: Secondary | ICD-10-CM | POA: Diagnosis not present

## 2024-08-12 DIAGNOSIS — Z1211 Encounter for screening for malignant neoplasm of colon: Secondary | ICD-10-CM

## 2024-08-12 DIAGNOSIS — J45909 Unspecified asthma, uncomplicated: Secondary | ICD-10-CM

## 2024-08-12 DIAGNOSIS — R911 Solitary pulmonary nodule: Secondary | ICD-10-CM

## 2024-08-12 DIAGNOSIS — R739 Hyperglycemia, unspecified: Secondary | ICD-10-CM | POA: Diagnosis not present

## 2024-08-12 DIAGNOSIS — I10 Essential (primary) hypertension: Secondary | ICD-10-CM

## 2024-08-12 DIAGNOSIS — R935 Abnormal findings on diagnostic imaging of other abdominal regions, including retroperitoneum: Secondary | ICD-10-CM

## 2024-08-12 LAB — HEMOGLOBIN A1C: Hgb A1c MFr Bld: 5.5 % (ref 4.6–6.5)

## 2024-08-12 LAB — LIPID PANEL
Cholesterol: 146 mg/dL (ref 0–200)
HDL: 43.8 mg/dL (ref >39.00)
LDL Cholesterol: 90 mg/dL (ref 0–99)
NonHDL: 102.42
Total CHOL/HDL Ratio: 3
Triglycerides: 64 mg/dL (ref 0.0–149.0)
VLDL: 12.8 mg/dL (ref 0.0–40.0)

## 2024-08-12 LAB — BASIC METABOLIC PANEL WITH GFR
BUN: 15 mg/dL (ref 6–23)
CO2: 29 meq/L (ref 19–32)
Calcium: 9.4 mg/dL (ref 8.4–10.5)
Chloride: 102 meq/L (ref 96–112)
Creatinine, Ser: 0.97 mg/dL (ref 0.40–1.20)
GFR: 70.7 mL/min (ref >60.00)
Glucose, Bld: 90 mg/dL (ref 70–99)
Potassium: 4.1 meq/L (ref 3.5–5.1)
Sodium: 137 meq/L (ref 135–145)

## 2024-08-12 LAB — HEPATIC FUNCTION PANEL
ALT: 12 U/L (ref 0–35)
AST: 16 U/L (ref 0–37)
Albumin: 4.3 g/dL (ref 3.5–5.2)
Alkaline Phosphatase: 57 U/L (ref 39–117)
Bilirubin, Direct: 0.1 mg/dL (ref 0.0–0.3)
Total Bilirubin: 0.6 mg/dL (ref 0.2–1.2)
Total Protein: 7.7 g/dL (ref 6.0–8.3)

## 2024-08-12 MED ORDER — HYDROCHLOROTHIAZIDE 25 MG PO TABS: 25.0000 mg | ORAL_TABLET | Freq: Every day | ORAL | 1 refills | Status: AC

## 2024-08-12 NOTE — Progress Notes (Unsigned)
 Subjective:    Patient ID: Ariel Perez, female    DOB: 09-12-1981, 43 y.o.   MRN: [id number]  Patient here for  Chief Complaint  Patient presents with   Medical Management of Chronic Issues    4 mth f/u     HPI Here for a scheduled follow up - follow up regarding hypertension, hyperglycemia and asthma. Continues hydrochlorothiazide . Had f/u with Dr Alva - 06/17/24 - ashtma remains stable. Uses Breo. Continue astelin  to help with drainage. Singulair  during peak allergy  seasons. Also recommended f/u chest CT - f/u lung nodule. CT - unchanged nodule - likely benign.    Past Medical History:  Diagnosis Date   Asthma    Fibroid    Headache    Hypertension    Migraines    Osteoarthritis    Past Surgical History:  Procedure Laterality Date   IR RADIOLOGIST EVAL & MGMT  01/13/2023   WISDOM TOOTH EXTRACTION N/A 2008   Approx x4    Family History  Problem Relation Age of Onset   Cancer Father        unknown   Hypertension Maternal Grandmother    Rheum arthritis Maternal Grandmother    Cancer Maternal Grandfather        unknown   Diabetes Paternal Grandmother    Breast cancer Neg Hx    Ovarian cancer Neg Hx    Uterine cancer Neg Hx    Prostate cancer Neg Hx    Colon cancer Neg Hx    Pancreatic cancer Neg Hx    Social History   Socioeconomic History   Marital status: Single    Spouse name: Not on file   Number of children: 0   Years of education: Not on file   Highest education level: Bachelor's degree (e.g., BA, AB, BS)  Occupational History   Occupation: Adult Nurse primary care  Tobacco Use   Smoking status: Never   Smokeless tobacco: Never  Vaping Use   Vaping status: Never Used  Substance and Sexual Activity   Alcohol use: Not Currently   Drug use: No   Sexual activity: Yes    Partners: Male    Birth control/protection: I.U.D.    Comment: 1 partner  Other Topics Concern   Not on file  Social History Narrative   Works here at [employer] by  herself    2 dogs live inside    Caffeine- Occasional    Right handed    Enjoys sleeping    Social Drivers of Health   Financial Resource Strain: Low Risk  (08/11/2024)   Overall Financial Resource Strain (CARDIA)    Difficulty of Paying Living Expenses: Not hard at all  Food Insecurity: No Food Insecurity (08/11/2024)   Hunger Vital Sign    Worried About Running Out of Food in the Last Year: Never true    Ran Out of Food in the Last Year: Never true  Transportation Needs: No Transportation Needs (08/11/2024)   PRAPARE - Administrator, Civil Service (Medical): No    Lack of Transportation (Non-Medical): No  Physical Activity: Insufficiently Active (08/11/2024)   Exercise Vital Sign    Days of Exercise per Week: 3 days    Minutes of Exercise per Session: 20 min  Stress: No Stress Concern Present (08/11/2024)   [employer] of Occupational Health - Occupational Stress Questionnaire    Feeling of Stress: Only a little  Social Connections: Moderately Isolated (08/11/2024)   Social Connection and  Isolation Panel    Frequency of Communication with Friends and Family: More than three times a week    Frequency of Social Gatherings with Friends and Family: Twice a week    Attends Religious Services: More than 4 times per year    Active Member of [employer] or Organizations: No    Attends Engineer, Structural: Not on file    Marital Status: Never married     Review of Systems     Objective:     BP (!) 130/90   Pulse 84   Temp 98.4 F (36.9 C) (Oral)   Ht 5' 5.5 (1.664 m)   Wt 229 lb 4 oz (104 kg)   SpO2 98%   BMI 37.57 kg/m  Wt Readings from Last 3 Encounters:  08/12/24 229 lb 4 oz (104 kg)  06/17/24 229 lb (103.9 kg)  04/11/24 231 lb (104.8 kg)    Physical Exam  {Perform Simple Foot Exam  Perform Detailed exam:1} {Insert foot Exam (Optional):30965}   Outpatient Encounter Medications as of 08/12/2024  Medication Sig   albuterol  (VENTOLIN   HFA) 108 (90 Base) MCG/ACT inhaler INHALE 1-2 PUFFS INTO THE LUNGS EVERY 6 (SIX) HOURS AS NEEDED FOR WHEEZING OR SHORTNESS OF BREATH.   azelastine  (ASTELIN ) 0.1 % nasal spray Place 2 sprays into both nostrils 2 (two) times daily. Use in each nostril as directed   fluticasone  furoate-vilanterol (BREO ELLIPTA ) 200-25 MCG/ACT AEPB Inhale 1 puff into the lungs daily.   hydrochlorothiazide  (HYDRODIURIL ) 25 MG tablet TAKE 1 TABLET (25 MG TOTAL) BY MOUTH DAILY.   levonorgestrel  (MIRENA , 52 MG,) 20 MCG/24HR IUD Mirena  20 mcg/24 hours (6 yrs) 52 mg intrauterine device  Take 1 device by intrauterine route.   montelukast  (SINGULAIR ) 10 MG tablet TAKE 1 TABLET AT BEDTIME   No facility-administered encounter medications on file as of 08/12/2024.     Lab Results  Component Value Date   WBC 5.8 04/11/2024   HGB 13.8 04/11/2024   HCT 42.2 04/11/2024   PLT 245.0 04/11/2024   GLUCOSE 93 04/11/2024   CHOL 143 04/11/2024   TRIG 51.0 04/11/2024   HDL 52.10 04/11/2024   LDLCALC 80 04/11/2024   ALT 10 04/11/2024   AST 14 04/11/2024   NA 138 04/11/2024   K 4.5 04/11/2024   CL 102 04/11/2024   CREATININE 0.92 04/11/2024   BUN 12 04/11/2024   CO2 29 04/11/2024   TSH 1.50 04/11/2024   HGBA1C 6.0 04/11/2024    CT Chest Wo Contrast Result Date: 07/13/2024 CLINICAL DATA:  Follow-up lung nodules * Tracking Code: BO * EXAM: CT CHEST WITHOUT CONTRAST TECHNIQUE: Multidetector CT imaging of the chest was performed following the standard protocol without IV contrast. RADIATION DOSE REDUCTION: This exam was performed according to the departmental dose-optimization program which includes automated exposure control, adjustment of the mA and/or kV according to patient size and/or use of iterative reconstruction technique. COMPARISON:  03/19/2023 FINDINGS: Cardiovascular: No significant vascular findings. Normal heart size. No pericardial effusion. Mediastinum/Nodes: No enlarged mediastinal, hilar, or axillary lymph  nodes. Thymic remnant in the anterior mediastinum. Thyroid  gland, trachea, and esophagus demonstrate no significant findings. Lungs/Pleura: Unchanged 0.5 cm nodule of the dependent left lower lobe (series 8, image 79). No new nodules. No pleural effusion or pneumothorax. Upper Abdomen: No acute abnormality. Musculoskeletal: No chest wall abnormality. No acute osseous findings. IMPRESSION: 1. Unchanged 0.5 cm nodule of the dependent left lower lobe, presumably benign given stability. No new nodules. 2. No acute findings in  the chest. Electronically Signed   By: Marolyn JONETTA Jaksch M.D.   On: 07/13/2024 19:36       Assessment & Plan:  Encounter for screening mammogram for malignant neoplasm of breast  Essential hypertension  Fibroids  Hyperglycemia  Screening for colon cancer     Allena Hamilton, MD

## 2024-08-14 ENCOUNTER — Ambulatory Visit: Payer: Self-pay | Admitting: Internal Medicine

## 2024-08-14 ENCOUNTER — Encounter: Payer: Self-pay | Admitting: Internal Medicine

## 2024-08-14 NOTE — Assessment & Plan Note (Signed)
 Low carb diet and exercise. Check met b and A1c today.  Lab Results  Component Value Date   HGBA1C 5.5 08/12/2024

## 2024-08-14 NOTE — Assessment & Plan Note (Signed)
 Saw gyn for follow up - fibroids.  Referred to gyn onc - concern one of the uterine masses appearing more abnormal with heterogeneous enhancement on MRI. Recommended hysterectomy vs MRI pelvis in 3 months. Elected f/u MRI pelvis. Reports had f/u with gyn. States - findings on scan were c/w degenerating fibroid. Saw gyn 11/2023. Continue f/u.

## 2024-08-14 NOTE — Assessment & Plan Note (Signed)
 Had f/u with Dr Jude - 06/17/24 - ashtma remains relatively stable. Also recommended f/u chest CT - f/u lung nodule. CT - unchanged nodule

## 2024-08-14 NOTE — Assessment & Plan Note (Signed)
 Had f/u with Dr Jude - 06/17/24 - ashtma remains relatively stable. Uses Breo. Was out in her yard recently without a mask. Discussed wearing a mask when working out in her yard. Continue astelin  to help with drainage. Singulair  during peak allergy  seasons.

## 2024-08-14 NOTE — Assessment & Plan Note (Signed)
 Increased stress as outlined. Has good support. Notify me if feels needs further intervention.

## 2024-08-14 NOTE — Assessment & Plan Note (Signed)
 Blood pressure as outlined. Continue hydrochlorothiazide . Have her spot check her pressures. No change in medi cation today. Check metabolic panel.

## 2024-08-14 NOTE — Assessment & Plan Note (Signed)
 Sees gyn - just saw Dr Limmie. Per note - last check 11/2023.  MRI 05/2023 - bulky fibroid uterus - see report. No further testing performed. Continue f/u with gyn. Discussed.

## 2024-12-09 ENCOUNTER — Ambulatory Visit: Admitting: Internal Medicine
